# Patient Record
Sex: Female | Born: 1950 | Race: White | Hispanic: No | Smoking: Former smoker
Health system: Southern US, Community
[De-identification: ages and names within clinical notes are randomized; demographics above are authoritative.]

## PROBLEM LIST (undated history)

## (undated) DIAGNOSIS — D649 Anemia, unspecified: Secondary | ICD-10-CM

## (undated) DIAGNOSIS — R51 Headache: Secondary | ICD-10-CM

## (undated) DIAGNOSIS — E039 Hypothyroidism, unspecified: Secondary | ICD-10-CM

## (undated) DIAGNOSIS — K5909 Other constipation: Secondary | ICD-10-CM

## (undated) DIAGNOSIS — G8929 Other chronic pain: Secondary | ICD-10-CM

## (undated) DIAGNOSIS — E119 Type 2 diabetes mellitus without complications: Secondary | ICD-10-CM

## (undated) DIAGNOSIS — M1990 Unspecified osteoarthritis, unspecified site: Secondary | ICD-10-CM

## (undated) DIAGNOSIS — E1142 Type 2 diabetes mellitus with diabetic polyneuropathy: Secondary | ICD-10-CM

## (undated) DIAGNOSIS — I10 Essential (primary) hypertension: Secondary | ICD-10-CM

## (undated) DIAGNOSIS — E7800 Pure hypercholesterolemia, unspecified: Secondary | ICD-10-CM

## (undated) DIAGNOSIS — N189 Chronic kidney disease, unspecified: Secondary | ICD-10-CM

## (undated) DIAGNOSIS — K2990 Gastroduodenitis, unspecified, without bleeding: Secondary | ICD-10-CM

## (undated) DIAGNOSIS — F319 Bipolar disorder, unspecified: Secondary | ICD-10-CM

## (undated) DIAGNOSIS — R4189 Other symptoms and signs involving cognitive functions and awareness: Secondary | ICD-10-CM

## (undated) DIAGNOSIS — F410 Panic disorder [episodic paroxysmal anxiety] without agoraphobia: Secondary | ICD-10-CM

## (undated) DIAGNOSIS — R519 Headache, unspecified: Secondary | ICD-10-CM

## (undated) DIAGNOSIS — R0602 Shortness of breath: Secondary | ICD-10-CM

## (undated) DIAGNOSIS — F22 Delusional disorders: Secondary | ICD-10-CM

## (undated) DIAGNOSIS — N3281 Overactive bladder: Secondary | ICD-10-CM

## (undated) DIAGNOSIS — F29 Unspecified psychosis not due to a substance or known physiological condition: Secondary | ICD-10-CM

## (undated) DIAGNOSIS — K219 Gastro-esophageal reflux disease without esophagitis: Secondary | ICD-10-CM

## (undated) DIAGNOSIS — F419 Anxiety disorder, unspecified: Secondary | ICD-10-CM

## (undated) HISTORY — PX: EYE SURGERY: SHX253

## (undated) HISTORY — PX: TUBAL LIGATION: SHX77

---

## 2003-08-12 ENCOUNTER — Emergency Department (HOSPITAL_COMMUNITY): Admission: EM | Admit: 2003-08-12 | Discharge: 2003-08-12 | Payer: Self-pay | Admitting: *Deleted

## 2004-08-02 ENCOUNTER — Emergency Department (HOSPITAL_COMMUNITY): Admission: EM | Admit: 2004-08-02 | Discharge: 2004-08-02 | Payer: Self-pay | Admitting: Emergency Medicine

## 2007-10-14 ENCOUNTER — Other Ambulatory Visit: Admission: RE | Admit: 2007-10-14 | Discharge: 2007-10-14 | Payer: Self-pay | Admitting: Obstetrics and Gynecology

## 2009-10-04 ENCOUNTER — Ambulatory Visit (HOSPITAL_COMMUNITY): Admission: RE | Admit: 2009-10-04 | Discharge: 2009-10-04 | Payer: Self-pay | Admitting: Ophthalmology

## 2010-07-08 LAB — BASIC METABOLIC PANEL
Calcium: 9.2 mg/dL (ref 8.4–10.5)
Creatinine, Ser: 0.81 mg/dL (ref 0.4–1.2)
GFR calc Af Amer: >60 mL/min (ref >60)
Potassium: 4.4 mEq/L (ref 3.5–5.1)
Sodium: 132 mEq/L — ABNORMAL LOW (ref 135–145)

## 2010-07-08 LAB — HEMOGLOBIN AND HEMATOCRIT, BLOOD: HCT: 38.4 % (ref 36.0–46.0)

## 2013-05-10 ENCOUNTER — Other Ambulatory Visit (INDEPENDENT_AMBULATORY_CARE_PROVIDER_SITE_OTHER): Payer: Self-pay | Admitting: *Deleted

## 2013-05-10 ENCOUNTER — Telehealth (INDEPENDENT_AMBULATORY_CARE_PROVIDER_SITE_OTHER): Payer: Self-pay | Admitting: *Deleted

## 2013-05-10 ENCOUNTER — Encounter (INDEPENDENT_AMBULATORY_CARE_PROVIDER_SITE_OTHER): Payer: Self-pay | Admitting: *Deleted

## 2013-05-10 DIAGNOSIS — Z800 Family history of malignant neoplasm of digestive organs: Secondary | ICD-10-CM

## 2013-05-10 DIAGNOSIS — Z1211 Encounter for screening for malignant neoplasm of colon: Secondary | ICD-10-CM

## 2013-05-10 DIAGNOSIS — D509 Iron deficiency anemia, unspecified: Secondary | ICD-10-CM

## 2013-05-10 MED ORDER — PEG-KCL-NACL-NASULF-NA ASC-C 100 G PO SOLR: 1.0000 | Freq: Once | ORAL | Status: DC

## 2013-05-10 NOTE — Telephone Encounter (Signed)
Patient needs movi prep 

## 2013-05-10 NOTE — Telephone Encounter (Signed)
agree

## 2013-05-10 NOTE — Telephone Encounter (Signed)
  Procedure: tcs/egd  Reason/Indication:  Iron def anemia, fam hx colon ca  Has patient had this procedure before?  Yes for EGD, no for TCS  If so, when, by whom and where?    Is there a family history of colon cancer?  Yes, mother  Who?  What age when diagnosed?    Is patient diabetic?   yes      Does patient have prosthetic heart valve?  no  Do you have a pacemaker?  no  Has patient ever had endocarditis? no  Has patient had joint replacement within last 12 months?  no  Does patient tend to be constipated or take laxatives? no  Is patient on Coumadin, Plavix and/or Aspirin? yes  Medications: asa 81 mg daily, Lisinopril 5 mg daily, oxybutynin 5 mg daily, pravastatin 40 mg daily, metformin 500 mg 2 tab bid (am & pm), lorazepam 0.5 mg daily prn, venlafaxine 75 mg & 37.5 mg daily, Nexium 40 mg daily, ferrous sulfate 150 mg daily, levemir insulin 15 units in am & pm, vit c 1000 mg daily, vit d 3 2000 mg daily, vit b12 1000 mg daily  Allergies: nkda  Medication Adjustment: asa 2 days, iron 10 days, hold evening dose of levemir on 05/19/13 and don't take morning of  Procedure date & time: 05/20/13 at 730

## 2013-05-12 ENCOUNTER — Encounter (HOSPITAL_COMMUNITY): Payer: Self-pay | Admitting: Pharmacy Technician

## 2013-05-16 ENCOUNTER — Emergency Department (HOSPITAL_COMMUNITY)
Admission: EM | Admit: 2013-05-16 | Discharge: 2013-05-16 | Disposition: A | Discharge status: Home / Self Care | Payer: Medicare Other | Attending: Emergency Medicine | Admitting: Emergency Medicine

## 2013-05-16 ENCOUNTER — Emergency Department (HOSPITAL_COMMUNITY): Payer: Medicare Other

## 2013-05-16 ENCOUNTER — Encounter (HOSPITAL_COMMUNITY): Payer: Self-pay | Admitting: Emergency Medicine

## 2013-05-16 DIAGNOSIS — R519 Headache, unspecified: Secondary | ICD-10-CM

## 2013-05-16 DIAGNOSIS — Z792 Long term (current) use of antibiotics: Secondary | ICD-10-CM | POA: Insufficient documentation

## 2013-05-16 DIAGNOSIS — I10 Essential (primary) hypertension: Secondary | ICD-10-CM | POA: Insufficient documentation

## 2013-05-16 DIAGNOSIS — Z79899 Other long term (current) drug therapy: Secondary | ICD-10-CM | POA: Insufficient documentation

## 2013-05-16 DIAGNOSIS — N318 Other neuromuscular dysfunction of bladder: Secondary | ICD-10-CM | POA: Insufficient documentation

## 2013-05-16 DIAGNOSIS — R51 Headache: Secondary | ICD-10-CM | POA: Insufficient documentation

## 2013-05-16 DIAGNOSIS — E7800 Pure hypercholesterolemia, unspecified: Secondary | ICD-10-CM | POA: Insufficient documentation

## 2013-05-16 DIAGNOSIS — E119 Type 2 diabetes mellitus without complications: Secondary | ICD-10-CM | POA: Insufficient documentation

## 2013-05-16 DIAGNOSIS — Z87891 Personal history of nicotine dependence: Secondary | ICD-10-CM | POA: Insufficient documentation

## 2013-05-16 DIAGNOSIS — Z7982 Long term (current) use of aspirin: Secondary | ICD-10-CM | POA: Insufficient documentation

## 2013-05-16 DIAGNOSIS — Z794 Long term (current) use of insulin: Secondary | ICD-10-CM | POA: Insufficient documentation

## 2013-05-16 DIAGNOSIS — Z8719 Personal history of other diseases of the digestive system: Secondary | ICD-10-CM | POA: Insufficient documentation

## 2013-05-16 DIAGNOSIS — F410 Panic disorder [episodic paroxysmal anxiety] without agoraphobia: Secondary | ICD-10-CM | POA: Insufficient documentation

## 2013-05-16 DIAGNOSIS — L089 Local infection of the skin and subcutaneous tissue, unspecified: Secondary | ICD-10-CM

## 2013-05-16 HISTORY — DX: Hypothyroidism, unspecified: E03.9

## 2013-05-16 HISTORY — DX: Overactive bladder: N32.81

## 2013-05-16 HISTORY — DX: Gastro-esophageal reflux disease without esophagitis: K21.9

## 2013-05-16 HISTORY — DX: Panic disorder (episodic paroxysmal anxiety): F41.0

## 2013-05-16 HISTORY — DX: Pure hypercholesterolemia, unspecified: E78.00

## 2013-05-16 HISTORY — DX: Type 2 diabetes mellitus without complications: E11.9

## 2013-05-16 HISTORY — DX: Essential (primary) hypertension: I10

## 2013-05-16 LAB — GLUCOSE, CAPILLARY: Glucose-Capillary: 84 mg/dL (ref 70–99)

## 2013-05-16 MED ORDER — PROCHLORPERAZINE EDISYLATE 5 MG/ML IJ SOLN
10.0000 mg | Freq: Once | INTRAMUSCULAR | Status: AC
  Administered 2013-05-16: 10 mg via INTRAMUSCULAR
  Filled 2013-05-16: qty 2

## 2013-05-16 MED ORDER — DIPHENHYDRAMINE HCL 25 MG PO CAPS: 25.0000 mg | ORAL_CAPSULE | Freq: Once | ORAL | Status: DC

## 2013-05-16 MED ORDER — KETOROLAC TROMETHAMINE 30 MG/ML IJ SOLN
30.0000 mg | Freq: Once | INTRAMUSCULAR | Status: AC
  Administered 2013-05-16: 30 mg via INTRAMUSCULAR
  Filled 2013-05-16: qty 1

## 2013-05-16 MED ORDER — CEPHALEXIN 500 MG PO CAPS: 500.0000 mg | ORAL_CAPSULE | Freq: Four times a day (QID) | ORAL | Status: DC

## 2013-05-16 NOTE — ED Notes (Signed)
Family at bedside. Patient states that EDP told her that he would have someone to check a CBG.

## 2013-05-16 NOTE — ED Notes (Addendum)
C/o headache that started yesterday. Pt reports pain increased today with pressure. C/o nausea with pain.

## 2013-05-16 NOTE — Discharge Instructions (Signed)

## 2013-05-19 NOTE — ED Provider Notes (Signed)
CSN: [id number]     Arrival date & time 05/16/13  1655 History   First MD Initiated Contact with Patient 05/16/13 1936     Chief Complaint  Patient presents with  . Headache  . Dizziness   (Consider location/radiation/quality/duration/timing/severity/associated sxs/prior Treatment) HPI  [age]-year-old female with headache. Gradual onset yesterday. The pain is diffuse describes as a pressure. Constant. No appreciable exacerbating relieving factors. Mild nausea, no vomiting. No fevers or chills. She denies any trauma. No acute visual changes. No numbness, tingling or loss of strength. No blood thinners aside from ASA. Has not tried taking anything for her symptoms. Also complaining of some mild pain in her left middle toe. Ounces early after trimming her nails. The pain at rest she has pain with ambulating and palpation. No fevers or chills. No swelling. No drainage.  Past Medical History  Diagnosis Date  . Diabetes mellitus without complication   . Panic attack   . Hypertension   . Hypercholesteremia   . GERD (gastroesophageal reflux disease)   . Hypothyroid   . Overactive bladder    Past Surgical History  Procedure Laterality Date  . Eye surgery    . Tubal ligation     History reviewed. No pertinent family history. History  Substance Use Topics  . Smoking status: Former Research scientist (life sciences)  . Smokeless tobacco: Not on file  . Alcohol Use: No   OB History   Grav Para Term Preterm Abortions TAB SAB Ect Mult Living                 Review of Systems  All systems reviewed and negative, other than as noted in HPI.   Allergies  Review of patient's allergies indicates no known allergies.  Home Medications   Current Outpatient Rx  Name  Route  Sig  Dispense  Refill  . Ascorbic Acid (VITAMIN C) 1000 MG tablet   Oral   Take 1,000 mg by mouth daily.         Marland Kitchen aspirin EC 81 MG tablet   Oral   Take 81 mg by mouth daily.         . Cholecalciferol (VITAMIN D) 2000 UNITS CAPS    Oral   Take 1 capsule by mouth daily.         . insulin detemir (LEVEMIR) 100 UNIT/ML injection   Subcutaneous   Inject 15 Units into the skin 2 (two) times daily.         . iron polysaccharides (NIFEREX) 150 MG capsule   Oral   Take 150 mg by mouth daily.         Marland Kitchen lisinopril (PRINIVIL,ZESTRIL) 5 MG tablet   Oral   Take 5 mg by mouth daily.         Marland Kitchen LORazepam (ATIVAN) 0.5 MG tablet   Oral   Take 0.5 mg by mouth daily as needed for anxiety.         . metFORMIN (GLUCOPHAGE) 500 MG tablet   Oral   Take 1,000 mg by mouth 2 (two) times daily with a meal.         . oxybutynin (DITROPAN) 5 MG tablet   Oral   Take 5 mg by mouth daily.         . pravastatin (PRAVACHOL) 40 MG tablet   Oral   Take 40 mg by mouth daily.         Marland Kitchen venlafaxine (EFFEXOR) 37.5 MG tablet   Oral   Take 37.5 mg by  mouth daily.         Marland Kitchen venlafaxine (EFFEXOR) 75 MG tablet   Oral   Take 75 mg by mouth daily.         . vitamin B-12 (CYANOCOBALAMIN) 1000 MCG tablet   Oral   Take 1,000 mcg by mouth daily.         . cephALEXin (KEFLEX) 500 MG capsule   Oral   Take 1 capsule (500 mg total) by mouth 4 (four) times daily.   30 capsule   0   . peg 3350 powder (MOVIPREP) 100 G SOLR   Oral   Take 1 kit (200 g total) by mouth once.   1 kit   0    BP 147/69  Pulse 86  Temp(Src) 98.1 F (36.7 C) (Oral)  Resp 18  Ht 5' 1" (1.549 m)  Wt 190 lb (86.183 kg)  BMI 35.92 kg/m2  SpO2 100% Physical Exam  Nursing note and vitals reviewed. Constitutional: She is oriented to person, place, and time. She appears well-developed and well-nourished. No distress.  HENT:  Head: Normocephalic and atraumatic.  Eyes: Conjunctivae and EOM are normal. Pupils are equal, round, and reactive to light. Right eye exhibits no discharge. Left eye exhibits no discharge.  Neck: Neck supple.  No nuchal rigidity  Cardiovascular: Normal rate, regular rhythm and normal heart sounds.  Exam reveals no gallop  and no friction rub.   No murmur heard. Pulmonary/Chest: Effort normal and breath sounds normal. No respiratory distress.  Abdominal: Soft. She exhibits no distension. There is no tenderness.  Musculoskeletal: She exhibits no edema and no tenderness.  Distal aspect of the left middle toe with mild erythema and tenderness to palpation. No fluctuance. No drainage.  Neurological: She is alert and oriented to person, place, and time. No cranial nerve deficit. She exhibits normal muscle tone. Coordination normal.  Gait steady  Skin: Skin is warm and dry.  Psychiatric: She has a normal mood and affect. Her behavior is normal. Thought content normal.    ED Course  Procedures (including critical care time) Labs Review Labs Reviewed  GLUCOSE, CAPILLARY   Imaging Review No results found.  EKG Interpretation   None       MDM   1. Headache   2. Toe infection    62yF with HA. Suspect primary HA. Consider emergent secondary causes such as bleed, infectious or mass but doubt. There is no history of trauma. Pt has a nonfocal neurological exam. Afebrile and neck supple. No use of blood thinning medication. Consider ocular etiology such as acute angle closure glaucoma but doubt. Pt denies acute change in visual acuity and eye exam unremarkable. Doubt temporal arteritis. No temporal tenderness and temporal artery pulsations palpable. Doubt CO poisoning. No contacts with similar symptoms. Doubt venous thrombosis. Doubt carotid or vertebral arteries dissection. Symptoms improved with meds. L middle toe with findings concerning for possible early cellulitis. Given her history of diabetes particular will treat.    Virgel Manifold, MD 05/19/13 [phone]

## 2013-05-20 ENCOUNTER — Encounter (HOSPITAL_COMMUNITY): Payer: Self-pay

## 2013-05-20 ENCOUNTER — Encounter (HOSPITAL_COMMUNITY)
Admission: RE | Disposition: A | Discharge status: Home / Self Care | Payer: Self-pay | Source: Ambulatory Visit | Attending: Internal Medicine

## 2013-05-20 ENCOUNTER — Ambulatory Visit (HOSPITAL_COMMUNITY)
Admission: RE | Admit: 2013-05-20 | Discharge: 2013-05-20 | Disposition: A | Discharge status: Home / Self Care | Payer: Medicare Other | Source: Ambulatory Visit | Attending: Internal Medicine | Admitting: Internal Medicine

## 2013-05-20 DIAGNOSIS — K644 Residual hemorrhoidal skin tags: Secondary | ICD-10-CM

## 2013-05-20 DIAGNOSIS — K5730 Diverticulosis of large intestine without perforation or abscess without bleeding: Secondary | ICD-10-CM

## 2013-05-20 DIAGNOSIS — I10 Essential (primary) hypertension: Secondary | ICD-10-CM | POA: Insufficient documentation

## 2013-05-20 DIAGNOSIS — Z8371 Family history of colonic polyps: Secondary | ICD-10-CM | POA: Insufficient documentation

## 2013-05-20 DIAGNOSIS — Z794 Long term (current) use of insulin: Secondary | ICD-10-CM | POA: Insufficient documentation

## 2013-05-20 DIAGNOSIS — Z7982 Long term (current) use of aspirin: Secondary | ICD-10-CM | POA: Insufficient documentation

## 2013-05-20 DIAGNOSIS — Z83719 Family history of colon polyps, unspecified: Secondary | ICD-10-CM | POA: Insufficient documentation

## 2013-05-20 DIAGNOSIS — K2990 Gastroduodenitis, unspecified, without bleeding: Secondary | ICD-10-CM

## 2013-05-20 DIAGNOSIS — K208 Other esophagitis without bleeding: Secondary | ICD-10-CM

## 2013-05-20 DIAGNOSIS — E119 Type 2 diabetes mellitus without complications: Secondary | ICD-10-CM | POA: Insufficient documentation

## 2013-05-20 DIAGNOSIS — D509 Iron deficiency anemia, unspecified: Secondary | ICD-10-CM | POA: Insufficient documentation

## 2013-05-20 DIAGNOSIS — Z800 Family history of malignant neoplasm of digestive organs: Secondary | ICD-10-CM | POA: Insufficient documentation

## 2013-05-20 DIAGNOSIS — R1310 Dysphagia, unspecified: Secondary | ICD-10-CM | POA: Insufficient documentation

## 2013-05-20 DIAGNOSIS — Z01812 Encounter for preprocedural laboratory examination: Secondary | ICD-10-CM | POA: Insufficient documentation

## 2013-05-20 DIAGNOSIS — K2960 Other gastritis without bleeding: Secondary | ICD-10-CM

## 2013-05-20 HISTORY — DX: Anemia, unspecified: D64.9

## 2013-05-20 HISTORY — DX: Unspecified osteoarthritis, unspecified site: M19.90

## 2013-05-20 HISTORY — PX: COLONOSCOPY WITH ESOPHAGOGASTRODUODENOSCOPY (EGD): SHX5779

## 2013-05-20 LAB — GLUCOSE, CAPILLARY: Glucose-Capillary: 135 mg/dL — ABNORMAL HIGH (ref 70–99)

## 2013-05-20 SURGERY — COLONOSCOPY WITH ESOPHAGOGASTRODUODENOSCOPY (EGD)
Anesthesia: Moderate Sedation

## 2013-05-20 MED ORDER — MEPERIDINE HCL 50 MG/ML IJ SOLN
INTRAMUSCULAR | Status: AC
  Filled 2013-05-20: qty 1

## 2013-05-20 MED ORDER — PROMETHAZINE HCL 25 MG/ML IJ SOLN
INTRAMUSCULAR | Status: AC
  Filled 2013-05-20: qty 1

## 2013-05-20 MED ORDER — PROMETHAZINE HCL 25 MG/ML IJ SOLN
INTRAMUSCULAR | Status: DC | PRN
  Administered 2013-05-20: 12.5 mg via INTRAVENOUS

## 2013-05-20 MED ORDER — STERILE WATER FOR IRRIGATION IR SOLN
Status: DC | PRN
  Administered 2013-05-20: 08:00:00

## 2013-05-20 MED ORDER — BUTAMBEN-TETRACAINE-BENZOCAINE 2-2-14 % EX AERO
INHALATION_SPRAY | CUTANEOUS | Status: DC | PRN
  Administered 2013-05-20: 2 via TOPICAL

## 2013-05-20 MED ORDER — SODIUM CHLORIDE 0.9 % IV SOLN
INTRAVENOUS | Status: DC
  Administered 2013-05-20: 07:00:00 via INTRAVENOUS

## 2013-05-20 MED ORDER — SODIUM CHLORIDE 0.9 % IJ SOLN
INTRAMUSCULAR | Status: AC
  Filled 2013-05-20: qty 10

## 2013-05-20 MED ORDER — MEPERIDINE HCL 50 MG/ML IJ SOLN
INTRAMUSCULAR | Status: DC | PRN
  Administered 2013-05-20 (×2): 25 mg via INTRAVENOUS

## 2013-05-20 MED ORDER — MIDAZOLAM HCL 5 MG/5ML IJ SOLN
INTRAMUSCULAR | Status: AC
  Filled 2013-05-20: qty 10

## 2013-05-20 MED ORDER — MIDAZOLAM HCL 5 MG/5ML IJ SOLN
INTRAMUSCULAR | Status: DC | PRN
  Administered 2013-05-20 (×2): 2 mg via INTRAVENOUS
  Administered 2013-05-20: 3 mg via INTRAVENOUS
  Administered 2013-05-20 (×2): 2 mg via INTRAVENOUS

## 2013-05-20 MED ORDER — MIDAZOLAM HCL 5 MG/5ML IJ SOLN
INTRAMUSCULAR | Status: AC
  Filled 2013-05-20: qty 5

## 2013-05-20 NOTE — H&P (Signed)
Erika Jacobs is an 63 y.o. female.   Chief Complaint: Patient is here for EGD, ED and colonoscopy. HPI: Patient is 32-year-old Caucasian female who was found to have iron deficiency anemia by Dr. Karie Kirks 2 weeks ago. She says she's had anemia most of her life. She has chronic GERD. Heartburn is well controlled with therapy. Symptoms relapse within a dose or 2. She also complains of intermittent solid food dysphagia. Her esophagus was dilated about  5 years ago at [facility] in  [facility]. She denies melena or rectal bleeding. She complains of intermittent nausea without vomiting. She states she's been losing weight since her diet was changed to improve control of her diabetes. Family history significant for colonic polyps and 2 brothers one of who died in his 65s of MI. Mother had colon carcinoma in early 37s and lived to be 61.  Past Medical History  Diagnosis Date  . Diabetes mellitus without complication   . Panic attack   . Hypertension   . Hypercholesteremia   . GERD (gastroesophageal reflux disease)   . Hypothyroid   . Overactive bladder   . Arthritis   . Anemia     Past Surgical History  Procedure Laterality Date  . Tubal ligation    . Eye surgery Left     History reviewed. No pertinent family history. Social History:  reports that she quit smoking about 11 years ago. Her smoking use included Cigarettes. She has a 60 pack-year smoking history. She does not have any smokeless tobacco history on file. She reports that she does not drink alcohol or use illicit drugs.  Allergies: No Known Allergies  Medications Prior to Admission  Medication Sig Dispense Refill  . Ascorbic Acid (VITAMIN C) 1000 MG tablet Take 1,000 mg by mouth daily.      Marland Kitchen aspirin EC 81 MG tablet Take 81 mg by mouth daily.      . cephALEXin (KEFLEX) 500 MG capsule Take 1 capsule (500 mg total) by mouth 4 (four) times daily.  30 capsule  0  . Cholecalciferol (VITAMIN D) 2000 UNITS CAPS Take 1 capsule by  mouth daily.      . insulin detemir (LEVEMIR) 100 UNIT/ML injection Inject 15 Units into the skin 2 (two) times daily.      . iron polysaccharides (NIFEREX) 150 MG capsule Take 150 mg by mouth daily.      Marland Kitchen lisinopril (PRINIVIL,ZESTRIL) 5 MG tablet Take 5 mg by mouth daily.      Marland Kitchen LORazepam (ATIVAN) 0.5 MG tablet Take 0.5 mg by mouth daily as needed for anxiety.      . metFORMIN (GLUCOPHAGE) 500 MG tablet Take 1,000 mg by mouth 2 (two) times daily with a meal.      . oxybutynin (DITROPAN) 5 MG tablet Take 5 mg by mouth daily.      . peg 3350 powder (MOVIPREP) 100 G SOLR Take 1 kit (200 g total) by mouth once.  1 kit  0  . pravastatin (PRAVACHOL) 40 MG tablet Take 40 mg by mouth daily.      Marland Kitchen venlafaxine (EFFEXOR) 37.5 MG tablet Take 37.5 mg by mouth daily.      Marland Kitchen venlafaxine (EFFEXOR) 75 MG tablet Take 75 mg by mouth daily.      . vitamin B-12 (CYANOCOBALAMIN) 1000 MCG tablet Take 1,000 mcg by mouth daily.           Nexium 40 mg by mouth q. a.m.  Results for orders placed  during the hospital encounter of 05/20/13 (from the past 48 hour(s))  GLUCOSE, CAPILLARY     Status: Abnormal   Collection Time    05/20/13  7:08 AM      Result Value Range   Glucose-Capillary 135 (*) 70 - 99 mg/dL   No results found.  ROS  Blood pressure 154/71, pulse 103, temperature 97.8 F (36.6 C), temperature source Oral, resp. rate 18, height _0  (1.549 m), weight 190 lb (86.183 kg), SpO2 97.00%. Physical Exam  Constitutional: She appears well-developed and well-nourished.  HENT:  Mouth/Throat: Oropharynx is clear and moist.  Eyes: Conjunctivae are normal. No scleral icterus.  Neck: No thyromegaly present.  Cardiovascular: Normal rate, regular rhythm and normal heart sounds.   No murmur heard. Respiratory: Effort normal and breath sounds normal.  GI: Soft. She exhibits no distension (mild midepigastric tenderness) and no mass.  Musculoskeletal: She exhibits no edema.  Lymphadenopathy:    She has no  cervical adenopathy.  Neurological: She is alert.  Skin: Skin is warm and dry.     Assessment/Plan Solid food dysphagia in a patient with chronic GERD. Iron deficiency anemia. History of colonic carcinoma and colonic polyps. EGD, ED and colonoscopy.  Erika,NAJEEB Jacobs 05/20/2013, 7:36 AM

## 2013-05-20 NOTE — Op Note (Signed)
EGD PROCEDURE REPORT  PATIENT:  Erika Jacobs  MR#:  [id number] Birthdate:  [birth year], 63 y.o., female Endoscopist:  Dr. Rogene Houston, MD Referred By:  Dr. Estill Bamberg. Karie Kirks, MD Procedure Date: 05/20/2013  Procedure:   EGD, ED & Colonoscopy  Indications:  Patient is 48-year-old Caucasian female who presents with intermittent solid food dysphagia she also has iron deficiency anemia. She has chronic GERD and heartburn is well controlled with PPI. She was recently found to have iron deficiency anemia by Dr. Karie Kirks. She denies melena or rectal bleeding. Family history significant for colonic polyps and 2 brothers(one diseased) colon carcinoma in mother in her early 25s.            Informed Consent:  The risks, benefits, alternatives & imponderables which include, but are not limited to, bleeding, infection, perforation, drug reaction and potential missed lesion have been reviewed.  The potential for biopsy, lesion removal, esophageal dilation, etc. have also been discussed.  Questions have been answered.  All parties agreeable.  Please see history & physical in medical record for more information.  Medications:  Demerol 50 mg IV Versed 11 mg IV Promethazine 12.5 mg IV and diluted form. Cetacaine spray topically for oropharyngeal anesthesia  EGD  Description of procedure:  The endoscope was introduced through the mouth and advanced to the second portion of the duodenum without difficulty or limitations. The mucosal surfaces were surveyed very carefully during advancement of the scope and upon withdrawal.  Findings:  Esophagus:  Mucosa of the esophagus was normal. Focal edema and erythema noted at GE junction without ring or stricture formation. GEJ:  36 cm Stomach:  Stomach was empty and distended very well with insufflation. Folds in the proximal stomach were normal. Mucosa at gastric body was normal. Two prepyloric erosions noted. Angularis fundus and cardia were unremarkable.  Pyloric channel was patent. Duodenum:  Patchy bulbar erythema and edema noted. Post bulbar mucosa was carefully examined with attention to rely and no abnormality noted.  Therapeutic/Diagnostic Maneuvers Performed:   Esophagus dilated by passing 33 French Maloney dilator for insertion. Esophageal mucosa was reexamined post dilation and no mucosal disruption noted.  COLONOSCOPY Description of procedure:  After a digital rectal exam was performed, that colonoscope was advanced from the anus through the rectum and colon to the area of the cecum, ileocecal valve and appendiceal orifice. The cecum was deeply intubated. These structures were well-seen and photographed for the record. From the level of the cecum and ileocecal valve, the scope was slowly and cautiously withdrawn. The mucosal surfaces were carefully surveyed utilizing scope tip to flexion to facilitate fold flattening as needed. The scope was pulled down into the rectum where a thorough exam including retroflexion was performed.  Findings:   Prep excellent. Scattered diverticula noted at sigmoid colon. Normal rectal mucosa. Small hemorrhoids below the dentate line.   Therapeutic/Diagnostic Maneuvers Performed:  None  Complications:  None  Cecal Withdrawal Time:  9 minutes  Impression:  EGD findings; Mild changes of reflux esophagitis limited to GE junction without ring or stricture formation. Erosive antral gastritis and bulbar duodenitis.  Colonoscopy findings; Examination performed to cecum. Moderate sigmoid colon diverticulosis. Small external hemorrhoids.  Please note endoscopic pictures are in EGD folder.  Recommendations:  Patient to resume iron preparation as before. H. pylori serology. Consider next colonoscopy in 5 years.  Zylie Mumaw U  05/20/2013 8:27 AM  CC: Dr. Robert Bellow, MD & Dr. Rayne Du ref. provider found

## 2013-05-20 NOTE — Progress Notes (Signed)
Awake. Diet coke given to drink per pt request. Swallowing without difficulty. Tolerated well. H-pylori drawn and sent to lab for results.

## 2013-05-20 NOTE — Discharge Instructions (Signed)
Resume usual medications. High fiber diet. No driving for 24 hours. Physician will contact you with results of blood work High-Fiber Diet Fiber is found in fruits, vegetables, and grains. A high-fiber diet encourages the addition of more whole grains, legumes, fruits, and vegetables in your diet. The recommended amount of fiber for adult males is 38 g per day. For adult females, it is 25 g per day. Pregnant and lactating women should get 28 g of fiber per day. If you have a digestive or bowel problem, ask your caregiver for advice before adding high-fiber foods to your diet. Eat a variety of high-fiber foods instead of only a select few type of foods.  PURPOSE  To increase stool bulk.  To make bowel movements more regular to prevent constipation.  To lower cholesterol.  To prevent overeating. WHEN IS THIS DIET USED?  It may be used if you have constipation and hemorrhoids.  It may be used if you have uncomplicated diverticulosis (intestine condition) and irritable bowel syndrome.  It may be used if you need help with weight management.  It may be used if you want to add it to your diet as a protective measure against atherosclerosis, diabetes, and cancer. SOURCES OF FIBER  Whole-grain breads and cereals.  Fruits, such as apples, oranges, bananas, berries, prunes, and pears.  Vegetables, such as green peas, carrots, sweet potatoes, beets, broccoli, cabbage, spinach, and artichokes.  Legumes, such split peas, soy, lentils.  Almonds. FIBER CONTENT IN FOODS Starches and Grains / Dietary Fiber (g)  Cheerios, 1 cup / 3 g  Corn Flakes cereal, 1 cup / 0.7 g  Rice crispy treat cereal, 1 cup / 0.3 g  Instant oatmeal (cooked),  cup / 2 g  Frosted wheat cereal, 1 cup / 5.1 g  Brown, long-grain rice (cooked), 1 cup / 3.5 g  White, long-grain rice (cooked), 1 cup / 0.6 g  Enriched macaroni (cooked), 1 cup / 2.5 g Legumes / Dietary Fiber (g)  Baked beans (canned, plain, or  vegetarian),  cup / 5.2 g  Kidney beans (canned),  cup / 6.8 g  Pinto beans (cooked),  cup / 5.5 g Breads and Crackers / Dietary Fiber (g)  Plain or honey graham crackers, 2 squares / 0.7 g  Saltine crackers, 3 squares / 0.3 g  Plain, salted pretzels, 10 pieces / 1.8 g  Whole-wheat bread, 1 slice / 1.9 g  White bread, 1 slice / 0.7 g  Raisin bread, 1 slice / 1.2 g  Plain bagel, 3 oz / 2 g  Flour tortilla, 1 oz / 0.9 g  Corn tortilla, 1 small / 1.5 g  Hamburger or hotdog bun, 1 small / 0.9 g Fruits / Dietary Fiber (g)  Apple with skin, 1 medium / 4.4 g  Sweetened applesauce,  cup / 1.5 g  Banana,  medium / 1.5 g  Grapes, 10 grapes / 0.4 g  Orange, 1 small / 2.3 g  Raisin, 1.5 oz / 1.6 g  Melon, 1 cup / 1.4 g Vegetables / Dietary Fiber (g)  Green beans (canned),  cup / 1.3 g  Carrots (cooked),  cup / 2.3 g  Broccoli (cooked),  cup / 2.8 g  Peas (cooked),  cup / 4.4 g  Mashed potatoes,  cup / 1.6 g  Lettuce, 1 cup / 0.5 g  Corn (canned),  cup / 1.6 g  Tomato,  cup / 1.1 g Document Released: 04/07/2005 Document Revised: 10/07/2011 Document Reviewed: 07/10/2011 Exit[employer] Patient  Information 2014 [place], [place]. Colonoscopy, Care After Refer to this sheet in the next few weeks. These instructions provide you with information on caring for yourself after your procedure. Your health care provider may also give you more specific instructions. Your treatment has been planned according to current medical practices, but problems sometimes occur. Call your health care provider if you have any problems or questions after your procedure. WHAT TO EXPECT AFTER THE PROCEDURE  After your procedure, it is typical to have the following:  A small amount of blood in your stool.  Moderate amounts of gas and mild abdominal cramping or bloating. HOME CARE INSTRUCTIONS  Do not drive, operate machinery, or sign important documents for 24 hours.  You may shower  and resume your regular physical activities, but move at a slower pace for the first 24 hours.  Take frequent rest periods for the first 24 hours.  Walk around or put a warm pack on your abdomen to help reduce abdominal cramping and bloating.  Drink enough fluids to keep your urine clear or pale yellow.  You may resume your normal diet as instructed by your health care provider. Avoid heavy or fried foods that are hard to digest.  Avoid drinking alcohol for 24 hours or as instructed by your health care provider.  Only take over-the-counter or prescription medicines as directed by your health care provider.  If a tissue sample (biopsy) was taken during your procedure:  Do not take aspirin or blood thinners for 7 days, or as instructed by your health care provider.  Do not drink alcohol for 7 days, or as instructed by your health care provider.  Eat soft foods for the first 24 hours. SEEK MEDICAL CARE IF: You have persistent spotting of blood in your stool 2 3 days after the procedure. SEEK IMMEDIATE MEDICAL CARE IF:  You have more than a small spotting of blood in your stool.  You pass large blood clots in your stool.  Your abdomen is swollen (distended).  You have nausea or vomiting.  You have a fever.  You have increasing abdominal pain that is not relieved with medicine. Document Released: 11/20/2003 Document Revised: 01/26/2013 Document Reviewed: 12/13/2012 [facility] Patient Information 2014 [place].

## 2013-05-21 ENCOUNTER — Emergency Department (HOSPITAL_COMMUNITY): Payer: Medicare Other

## 2013-05-21 ENCOUNTER — Encounter (HOSPITAL_COMMUNITY): Payer: Self-pay | Admitting: Emergency Medicine

## 2013-05-21 ENCOUNTER — Emergency Department (HOSPITAL_COMMUNITY)
Admission: EM | Admit: 2013-05-21 | Discharge: 2013-05-21 | Disposition: A | Discharge status: Home / Self Care | Payer: Medicare Other | Attending: Emergency Medicine | Admitting: Emergency Medicine

## 2013-05-21 DIAGNOSIS — Z792 Long term (current) use of antibiotics: Secondary | ICD-10-CM | POA: Insufficient documentation

## 2013-05-21 DIAGNOSIS — M549 Dorsalgia, unspecified: Secondary | ICD-10-CM | POA: Insufficient documentation

## 2013-05-21 DIAGNOSIS — R110 Nausea: Secondary | ICD-10-CM | POA: Insufficient documentation

## 2013-05-21 DIAGNOSIS — Z862 Personal history of diseases of the blood and blood-forming organs and certain disorders involving the immune mechanism: Secondary | ICD-10-CM | POA: Insufficient documentation

## 2013-05-21 DIAGNOSIS — Z87448 Personal history of other diseases of urinary system: Secondary | ICD-10-CM | POA: Insufficient documentation

## 2013-05-21 DIAGNOSIS — Z79899 Other long term (current) drug therapy: Secondary | ICD-10-CM | POA: Insufficient documentation

## 2013-05-21 DIAGNOSIS — R209 Unspecified disturbances of skin sensation: Secondary | ICD-10-CM | POA: Insufficient documentation

## 2013-05-21 DIAGNOSIS — E119 Type 2 diabetes mellitus without complications: Secondary | ICD-10-CM | POA: Insufficient documentation

## 2013-05-21 DIAGNOSIS — M129 Arthropathy, unspecified: Secondary | ICD-10-CM | POA: Insufficient documentation

## 2013-05-21 DIAGNOSIS — Z87891 Personal history of nicotine dependence: Secondary | ICD-10-CM | POA: Insufficient documentation

## 2013-05-21 DIAGNOSIS — F410 Panic disorder [episodic paroxysmal anxiety] without agoraphobia: Secondary | ICD-10-CM | POA: Insufficient documentation

## 2013-05-21 DIAGNOSIS — Z8719 Personal history of other diseases of the digestive system: Secondary | ICD-10-CM | POA: Insufficient documentation

## 2013-05-21 DIAGNOSIS — Z7982 Long term (current) use of aspirin: Secondary | ICD-10-CM | POA: Insufficient documentation

## 2013-05-21 DIAGNOSIS — E7800 Pure hypercholesterolemia, unspecified: Secondary | ICD-10-CM | POA: Insufficient documentation

## 2013-05-21 DIAGNOSIS — R200 Anesthesia of skin: Secondary | ICD-10-CM

## 2013-05-21 DIAGNOSIS — M25559 Pain in unspecified hip: Secondary | ICD-10-CM | POA: Insufficient documentation

## 2013-05-21 DIAGNOSIS — Z794 Long term (current) use of insulin: Secondary | ICD-10-CM | POA: Insufficient documentation

## 2013-05-21 DIAGNOSIS — I10 Essential (primary) hypertension: Secondary | ICD-10-CM | POA: Insufficient documentation

## 2013-05-21 LAB — COMPREHENSIVE METABOLIC PANEL
ALT: 54 U/L — AB (ref 0–35)
AST: 45 U/L — ABNORMAL HIGH (ref 0–37)
Albumin: 3.4 g/dL — ABNORMAL LOW (ref 3.5–5.2)
Alkaline Phosphatase: 58 U/L (ref 39–117)
BUN: 11 mg/dL (ref 6–23)
CALCIUM: 8.8 mg/dL (ref 8.4–10.5)
CHLORIDE: 103 meq/L (ref 96–112)
CO2: 24 meq/L (ref 19–32)
Creatinine, Ser: 0.78 mg/dL (ref 0.50–1.10)
GFR calc Af Amer: >90 mL/min (ref >90)
GFR, EST NON AFRICAN AMERICAN: 89 mL/min — AB (ref >90)
GLUCOSE: 120 mg/dL — AB (ref 70–99)
Potassium: 4.3 mEq/L (ref 3.7–5.3)
SODIUM: 138 meq/L (ref 137–147)
Total Bilirubin: 0.2 mg/dL — ABNORMAL LOW (ref 0.3–1.2)
Total Protein: 7.3 g/dL (ref 6.0–8.3)

## 2013-05-21 LAB — CBC WITH DIFFERENTIAL/PLATELET
Basophils Absolute: 0 10*3/uL (ref 0.0–0.1)
Basophils Relative: 0 % (ref 0–1)
EOS ABS: 0.1 10*3/uL (ref 0.0–0.7)
Eosinophils Relative: 2 % (ref 0–5)
HEMATOCRIT: 32.7 % — AB (ref 36.0–46.0)
HEMOGLOBIN: 9.8 g/dL — AB (ref 12.0–15.0)
Lymphocytes Relative: 42 % (ref 12–46)
Lymphs Abs: 1.7 10*3/uL (ref 0.7–4.0)
MCH: 21.8 pg — ABNORMAL LOW (ref 26.0–34.0)
MCHC: 30 g/dL (ref 30.0–36.0)
MCV: 72.8 fL — ABNORMAL LOW (ref 78.0–100.0)
Monocytes Absolute: 0.3 10*3/uL (ref 0.1–1.0)
Monocytes Relative: 8 % (ref 3–12)
NEUTROS ABS: 1.9 10*3/uL (ref 1.7–7.7)
NEUTROS PCT: 48 % (ref 43–77)
Platelets: 192 10*3/uL (ref 150–400)
RBC: 4.49 MIL/uL (ref 3.87–5.11)
RDW: 23.8 % — ABNORMAL HIGH (ref 11.5–15.5)
WBC: 4 10*3/uL (ref 4.0–10.5)

## 2013-05-21 LAB — URINALYSIS, ROUTINE W REFLEX MICROSCOPIC
BILIRUBIN URINE: NEGATIVE
GLUCOSE, UA: NEGATIVE mg/dL
HGB URINE DIPSTICK: NEGATIVE
Ketones, ur: NEGATIVE mg/dL
Leukocytes, UA: NEGATIVE
Nitrite: NEGATIVE
PROTEIN: NEGATIVE mg/dL
Specific Gravity, Urine: <1.005 — ABNORMAL LOW (ref 1.005–1.030)
UROBILINOGEN UA: 0.2 mg/dL (ref 0.0–1.0)
pH: 5.5 (ref 5.0–8.0)

## 2013-05-21 LAB — LACTIC ACID, PLASMA: Lactic Acid, Venous: 0.9 mmol/L (ref 0.5–2.2)

## 2013-05-21 MED ORDER — SODIUM CHLORIDE 0.9 % IV BOLUS (SEPSIS)
1000.0000 mL | Freq: Once | INTRAVENOUS | Status: AC
  Administered 2013-05-21: 1000 mL via INTRAVENOUS

## 2013-05-21 NOTE — ED Notes (Signed)
Pt c/o feeling like I am going to pass out, tingling to left hand that started this am around 9:00, lower back pain that radiates to right buttock area, pt states that the back pain feels the same as her back problems in the past, pt states that she had an episode similar to today's episode on 05/04/2013, was seen at [facility] er and admitted for evaluation, unsure of diagnosis,

## 2013-05-21 NOTE — Discharge Instructions (Signed)
As discussed, it is important that you follow up as soon as possible with your physician for continued management of your condition. ° °If you develop any new, or concerning changes in your condition, please return to the emergency department immediately. ° °

## 2013-05-21 NOTE — ED Provider Notes (Signed)
CSN: [id number]     Arrival date & time 05/21/13  1206 History  This chart was scribed for Carmin Muskrat, MD by Maree Erie, ED Scribe. The patient was seen in room [facility]. Patient's care was started at 12:41 PM.     Chief Complaint  Patient presents with  . Numbness    The history is provided by the patient. No language interpreter was used.    HPI Comments: AMEILIA HIRAKAWA is a 63 y.o. female who presents to the Emergency Department complaining of an episode of constant, improving numbness to her left leg and around mouth that began this morning a few hours ago. She reports constant nausea with the numbness. The is also complaining of right hip and back pain that she states is new today. She denies fever, vomiting or diarrhea. She states that she had a colonoscopy and endoscopy done yesterday that shestates was negative for polyps but the endoscopy showed an "infection in her throat." She states she is going to be scheduled to have a cholecystectomy. She reports a family history of polyps.  Past Medical History  Diagnosis Date  . Diabetes mellitus without complication   . Panic attack   . Hypertension   . Hypercholesteremia   . GERD (gastroesophageal reflux disease)   . Hypothyroid   . Overactive bladder   . Arthritis   . Anemia    Past Surgical History  Procedure Laterality Date  . Tubal ligation    . Eye surgery Left    No family history on file. History  Substance Use Topics  . Smoking status: Former Smoker -- 2.00 packs/day for 30 years    Types: Cigarettes    Quit date: 05/20/2002  . Smokeless tobacco: Not on file  . Alcohol Use: No   OB History   Grav Para Term Preterm Abortions TAB SAB Ect Mult Living                 Review of Systems  Constitutional:       Per HPI, otherwise negative  HENT:       Per HPI, otherwise negative  Respiratory:       Per HPI, otherwise negative  Cardiovascular:       Per HPI, otherwise negative  Gastrointestinal:  Negative for vomiting.  Endocrine:       Negative aside from HPI  Genitourinary:       Neg aside from HPI   Musculoskeletal:       Per HPI, otherwise negative  Skin: Negative.   Neurological: Negative for syncope.    Allergies  Review of patient's allergies indicates no known allergies.  Home Medications   Current Outpatient Rx  Name  Route  Sig  Dispense  Refill  . Ascorbic Acid (VITAMIN C) 1000 MG tablet   Oral   Take 1,000 mg by mouth daily.         Marland Kitchen aspirin EC 81 MG tablet   Oral   Take 81 mg by mouth daily.         . cephALEXin (KEFLEX) 500 MG capsule   Oral   Take 1 capsule (500 mg total) by mouth 4 (four) times daily.   30 capsule   0   . Cholecalciferol (VITAMIN D) 2000 UNITS CAPS   Oral   Take 1 capsule by mouth daily.         . insulin detemir (LEVEMIR) 100 UNIT/ML injection   Subcutaneous   Inject 15 Units into the skin  2 (two) times daily.         . iron polysaccharides (NIFEREX) 150 MG capsule   Oral   Take 150 mg by mouth daily.         Marland Kitchen lisinopril (PRINIVIL,ZESTRIL) 5 MG tablet   Oral   Take 5 mg by mouth daily.         Marland Kitchen LORazepam (ATIVAN) 0.5 MG tablet   Oral   Take 0.5 mg by mouth daily as needed for anxiety.         . metFORMIN (GLUCOPHAGE) 500 MG tablet   Oral   Take 1,000 mg by mouth 2 (two) times daily with a meal.         . oxybutynin (DITROPAN) 5 MG tablet   Oral   Take 5 mg by mouth daily.         . pravastatin (PRAVACHOL) 40 MG tablet   Oral   Take 40 mg by mouth daily.         Marland Kitchen venlafaxine (EFFEXOR) 37.5 MG tablet   Oral   Take 37.5 mg by mouth daily.         Marland Kitchen venlafaxine (EFFEXOR) 75 MG tablet   Oral   Take 75 mg by mouth daily.         . vitamin B-12 (CYANOCOBALAMIN) 1000 MCG tablet   Oral   Take 1,000 mcg by mouth daily.          There were no vitals taken for this visit. Physical Exam  Nursing note and vitals reviewed. Constitutional: She is oriented to person, place, and time.  She appears well-developed and well-nourished. No distress.  HENT:  Head: Normocephalic and atraumatic.  Eyes: Conjunctivae and EOM are normal.  Cardiovascular: Normal rate and regular rhythm.   Pulmonary/Chest: Effort normal and breath sounds normal. No stridor. No respiratory distress.  Abdominal: She exhibits no distension.  Musculoskeletal: She exhibits no edema.  Neurological: She is alert and oriented to person, place, and time. She has normal strength. No cranial nerve deficit or sensory deficit.  No facial asymmetry.   Skin: Skin is warm and dry.  Psychiatric: She has a normal mood and affect.    ED Course  Procedures (including critical care time)    COORDINATION OF CARE: 12:46 PM - Patient verbalizes understanding and agrees with treatment plan.    Labs Review Labs Reviewed  CBC WITH DIFFERENTIAL - Abnormal; Notable for the following:    Hemoglobin 9.8 (*)    HCT 32.7 (*)    MCV 72.8 (*)    MCH 21.8 (*)    RDW 23.8 (*)    All other components within normal limits  COMPREHENSIVE METABOLIC PANEL - Abnormal; Notable for the following:    Glucose, Bld 120 (*)    Albumin 3.4 (*)    AST 45 (*)    ALT 54 (*)    Total Bilirubin 0.2 (*)    GFR calc non Af Amer 89 (*)    All other components within normal limits  URINALYSIS, ROUTINE W REFLEX MICROSCOPIC - Abnormal; Notable for the following:    Specific Gravity, Urine <1.005 (*)    All other components within normal limits  LACTIC ACID, PLASMA   Imaging Review No results found.  EKG Interpretation   None       MDM     I personally performed the services described in this documentation, which was scribed in my presence. The recorded information has been reviewed and is accurate.  Patient presents one day after elective colonoscopy, endoscopy now with subjective neurologic complaints.  On exam she is awake, alert and hemodynamically stable, neurologically intact, with no evidence of systemic compromise.   With patient's reassuring physical exam, labs, she is appropriate for discharge with outpatient followup.  Carmin Muskrat, MD 05/21/13 [phone]

## 2013-05-23 ENCOUNTER — Encounter (HOSPITAL_COMMUNITY): Payer: Self-pay | Admitting: Internal Medicine

## 2013-05-23 ENCOUNTER — Telehealth (INDEPENDENT_AMBULATORY_CARE_PROVIDER_SITE_OTHER): Payer: Self-pay | Admitting: *Deleted

## 2013-05-23 LAB — H. PYLORI ANTIBODY, IGG: H Pylori IgG: 0.61 {ISR}

## 2013-05-23 NOTE — Telephone Encounter (Signed)
05/20/13 this is Dr.Rehman's recommendation from EGD/Colonoscopy: Impression:  EGD findings;  Mild changes of reflux esophagitis limited to GE junction without ring or stricture formation.  Erosive antral gastritis and bulbar duodenitis.  Colonoscopy findings;  Examination performed to cecum.  Moderate sigmoid colon diverticulosis.  Small external hemorrhoids.  Please note endoscopic pictures are in EGD folder.  Recommendations:  Patient to resume iron preparation as before.  H. pylori serology.  Consider next colonoscopy in 5 years.

## 2013-05-23 NOTE — Telephone Encounter (Signed)
Had a TCS on 05/19/13 and the lady that was with her spoke with Dr. Laural Golden. She didn't understand everything he said because she has a hearing impairment. Would like the results and any information about the procedure she may need to know.

## 2013-05-25 NOTE — Telephone Encounter (Signed)
Patient was called and her results was gone over with her. She was also given the result of her H- Pylori -Negative. Patient states that she went to the Ed last night at [facility]. Her B/P was high and she was told that she needed to get her Gall Bladder out ASAP. Dr. Karie Kirks told her that they had sent a second referral to [facility] on 05-24-13.

## 2013-05-26 NOTE — Consult Note (Signed)
NAME:  RODELLA, LOPER             ACCOUNT NO.:  [id number]  MEDICAL RECORD NO.:  [id number]  LOCATION:  AP[facility]05                         FACILITY:  [facility]  PHYSICIAN:  Felicie Morn, M.D. DATE OF BIRTH:  06/03/1950  DATE OF CONSULTATION:  05/25/2013 DATE OF DISCHARGE:  05/21/2013                                CONSULTATION   NOTE:  Surgery was asked to see this 63 year old obese white female with history of flank pain, seen in the emergency room and found on sonography to have gallstones.  In essence, she is fairly asymptomatic at present.  She has had no real nausea or vomiting.  However, she has had some vague right flank pain, and she was noted to have gallstones when worked up in the emergency room at [facility].  She was referred to me by Dr. Karie Kirks to see regarding her gallbladder disease.  PAST MEDICAL HISTORY:  Positive for having: 1. Type 2 diabetes. 2. Hypertension. 3. Anxiety attacks with occasional bouts of depression. 4. Reflux. 5. She has had a past history of Bell's palsy.  PAST SURGERIES:  A tubal ligation in 1978, and she had left cataract surgery.  ALLERGIES:  She has no known allergies.  MEDICATIONS:  See medication list.  SOCIAL HISTORY:  She is a nonsmoker and nondrinker.  PHYSICAL EXAMINATION:  VITAL SIGNS:  She is 5 feet 3/4 inches and weighs 195 pounds.  Her temperature is 97.7, her pulse is 88, respirations 12, blood pressure 156/70. HEENT:  Head is normocephalic.  Eyes, extraocular movements are intact. Pupils are round and reactive to light and accommodation.  The patient has had previous left cataract surgery, NECK:  I do not appreciate the presence of any bruits or thyromegaly or any adenopathy or jugular vein distention. CHEST:  Clear both anterior and posterior auscultation. HEART:  Regular rhythm.  Breasts and axillae are without masses. ABDOMEN:  Very globus.  She has no obvious hernias or visceromegaly. RECTAL:   Deferred. EXTREMITIES:  Without clubbing, varicosities, or cyanosis.  Pulses are full and symmetrical.  REVIEW OF SYSTEMS:  NEURO:  No history of any lateralizing neurological findings.  No migraines, no seizures.  He does have a history of anxiety and depression.  ENDOCRINE:  The patient is type 2 diabetic.  No history of thyroid disease or adrenal problems.  CARDIOPULMONARY:  The patient is hypertensive.  MUSCULOSKELETAL:  Obesity.  OB GYN:  She is a gravida 4, para 4, cesarean 0, abortus 0 female whose last menstrual period was in 2005.  Her last mammogram was 2010.  She stopped having her mammograms.  We have encouraged her to resume having yearly mammograms. She has had past history of tubal ligation.  She has as stated no carcinoma of the breast with any first line relatives.  GI:  No past history of hepatitis, constipation, diarrhea, bright red rectal bleeding, or melena or history of inflammatory bowel disease, or irritable bowel syndrome.  No history of unexplained weight loss.  The patient is obese, and she does suffer from GERD.  She underwent a colonoscopy in 2005 with no pathological findings as well as an upper GI endoscopy.  GU:  No history of frequency, dysuria, or  kidney stones.  Review of history and physical therefore Ms. Colletta is a 63 year old obese white female who has pretty much  asymptomatic gallstones, although she has some vague symptoms that suggest that she has gallbladder disease with her first episodes occurring now.  Because she is young and has these symptoms, I urged her to consider gallbladder surgery as this might be very well, would live long enough to have some problems with this and possibly presented in the emergent nature. However, the decision is hers.  She opted to have that surgery done.  We discussed complications not limited to but including bleeding, infection, damage to bile ducts, perforation of organs, transitory diarrhea, and the  possibility of open surgery might be required. Informed consent was obtained.  We will plan for surgery via the outpatient department.  We will review her liver function studies preoperatively, and I would like to thank Dr. Karie Kirks for his confidence in sending this patient my way.     Felicie Morn, M.D.     WB/MEDQ  D:  05/25/2013  T:  05/26/2013  Job:  [id number]  cc:   Estill Bamberg. Karie Kirks, M.D. [phone]  Hildred Laser, M.D. Fax: [phone]

## 2013-05-26 NOTE — Patient Instructions (Signed)
Erika Jacobs  05/26/2013   Your procedure is scheduled on:  06/02/2013  Report to [facility] at  82  AM.  Call this number if you have problems the morning of surgery: [phone]   Remember:   Do not eat food or drink liquids after midnight.   Take these medicines the morning of surgery with A SIP OF WATER: nexium, lisinopril, lorazepam, effexor   Do not wear jewelry, make-up or nail polish.  Do not wear lotions, powders, or perfumes.   Do not shave 48 hours prior to surgery. Men may shave face and neck.  Do not bring valuables to the hospital.  [facility] is not responsible for any belongings or valuables.               Contacts, dentures or bridgework may not be worn into surgery.  Leave suitcase in the car. After surgery it may be brought to your room.  For patients admitted to the hospital, discharge time is determined by your treatment team.               Patients discharged the day of surgery will not be allowed to drive home.  Name and phone number of your driver: family  Special Instructions: Shower using CHG 2 nights before surgery and the night before surgery.  If you shower the day of surgery use CHG.  Use special wash - you have one bottle of CHG for all showers.  You should use approximately 1/3 of the bottle for each shower.   Please read over the following fact sheets that you were given: Pain Booklet, Coughing and Deep Breathing, Surgical Site Infection Prevention, Anesthesia Post-op Instructions and Care and Recovery After Surgery Laparoscopic Cholecystectomy Laparoscopic cholecystectomy is surgery to remove the gallbladder. The gallbladder is located in the upper right part of the abdomen, behind the liver. It is a storage sac for bile produced in the liver. Bile aids in the digestion and absorption of fats. Cholecystectomy is often done for inflammation of the gallbladder (cholecystitis). This condition is usually caused by a buildup of gallstones  (cholelithiasis) in your gallbladder. Gallstones can block the flow of bile, resulting in inflammation and pain. In severe cases, emergency surgery may be required. When emergency surgery is not required, you will have time to prepare for the procedure. Laparoscopic surgery is an alternative to open surgery. Laparoscopic surgery has a shorter recovery time. Your common bile duct may also need to be examined during the procedure. If stones are found in the common bile duct, they may be removed. LET [facility] CARE PROVIDER KNOW ABOUT:  Any allergies you have.  All medicines you are taking, including vitamins, herbs, eye drops, creams, and over-the-counter medicines.  Previous problems you or members of your family have had with the use of anesthetics.  Any blood disorders you have.  Previous surgeries you have had.  Medical conditions you have. RISKS AND COMPLICATIONS Generally, this is a safe procedure. However, as with any procedure, complications can occur. Possible complications include:  Infection.  Damage to the common bile duct, nerves, arteries, veins, or other internal organs such as the stomach, liver, or intestines.  Bleeding.  A stone may remain in the common bile duct.  A bile leak from the cyst duct that is clipped when your gallbladder is removed.  The need to convert to open surgery, which requires a larger incision in the abdomen. This may be necessary if your surgeon  thinks it is not safe to continue with a laparoscopic procedure. BEFORE THE PROCEDURE  Ask your health care provider about changing or stopping any regular medicines. You will need to stop taking aspirin or blood thinners at least 5 days prior to surgery.  Do not eat or drink anything after midnight the night before surgery.  Let your health care provider know if you develop a cold or other infectious problem before surgery. PROCEDURE   You will be given medicine to make you sleep through the  procedure (general anesthetic). A breathing tube will be placed in your mouth.  When you are asleep, your surgeon will make several small cuts (incisions) in your abdomen.  A thin, lighted tube with a tiny camera on the end (laparoscope) is inserted through one of the small incisions. The camera on the laparoscope sends a picture to a TV screen in the operating room. This gives the surgeon a good view inside your abdomen.  A gas will be pumped into your abdomen. This expands your abdomen so that the surgeon has more room to perform the surgery.  Other tools needed for the procedure are inserted through the other incisions. The gallbladder is removed through one of the incisions.  After the removal of your gallbladder, the incisions will be closed with stitches, staples, or skin glue. AFTER THE PROCEDURE  You will be taken to a recovery area where your progress will be checked often.  You may be allowed to go home the same day if your pain is controlled and you can tolerate liquids. Document Released: 04/07/2005 Document Revised: 01/26/2013 Document Reviewed: 11/17/2012 [facility] Patient Information 2014 Erika Jacobs. PATIENT INSTRUCTIONS POST-ANESTHESIA  IMMEDIATELY FOLLOWING SURGERY:  Do not drive or operate machinery for the first twenty four hours after surgery.  Do not make any important decisions for twenty four hours after surgery or while taking narcotic pain medications or sedatives.  If you develop intractable nausea and vomiting or a severe headache please notify your doctor immediately.  FOLLOW-UP:  Please make an appointment with your surgeon as instructed. You do not need to follow up with anesthesia unless specifically instructed to do so.  WOUND CARE INSTRUCTIONS (if applicable):  Keep a dry clean dressing on the anesthesia/puncture wound site if there is drainage.  Once the wound has quit draining you may leave it open to air.  Generally you should leave the bandage intact  for twenty four hours unless there is drainage.  If the epidural site drains for more than 36-48 hours please call the anesthesia department.  QUESTIONS?:  Please feel free to call your physician or the hospital operator if you have any questions, and they will be happy to assist you.

## 2013-05-27 ENCOUNTER — Other Ambulatory Visit: Payer: Self-pay

## 2013-05-27 ENCOUNTER — Encounter (HOSPITAL_COMMUNITY)
Admission: RE | Admit: 2013-05-27 | Discharge: 2013-05-27 | Disposition: A | Discharge status: Home / Self Care | Payer: Medicare Other | Source: Ambulatory Visit | Attending: General Surgery | Admitting: General Surgery

## 2013-05-27 ENCOUNTER — Encounter (HOSPITAL_COMMUNITY): Payer: Self-pay | Admitting: Pharmacy Technician

## 2013-05-27 ENCOUNTER — Encounter (HOSPITAL_COMMUNITY): Payer: Self-pay

## 2013-05-27 DIAGNOSIS — Z01812 Encounter for preprocedural laboratory examination: Secondary | ICD-10-CM | POA: Insufficient documentation

## 2013-05-27 HISTORY — DX: Shortness of breath: R06.02

## 2013-05-27 LAB — CBC WITH DIFFERENTIAL/PLATELET
BASOS ABS: 0 10*3/uL (ref 0.0–0.1)
Basophils Relative: 0 % (ref 0–1)
EOS ABS: 0.1 10*3/uL (ref 0.0–0.7)
EOS PCT: 2 % (ref 0–5)
HEMATOCRIT: 33.9 % — AB (ref 36.0–46.0)
Hemoglobin: 10.2 g/dL — ABNORMAL LOW (ref 12.0–15.0)
LYMPHS PCT: 31 % (ref 12–46)
Lymphs Abs: 1.3 10*3/uL (ref 0.7–4.0)
MCH: 22.3 pg — AB (ref 26.0–34.0)
MCHC: 30.1 g/dL (ref 30.0–36.0)
MCV: 74 fL — AB (ref 78.0–100.0)
MONO ABS: 0.4 10*3/uL (ref 0.1–1.0)
Monocytes Relative: 9 % (ref 3–12)
Neutro Abs: 2.5 10*3/uL (ref 1.7–7.7)
Neutrophils Relative %: 58 % (ref 43–77)
PLATELETS: 188 10*3/uL (ref 150–400)
RBC: 4.58 MIL/uL (ref 3.87–5.11)
RDW: 24.6 % — ABNORMAL HIGH (ref 11.5–15.5)
WBC: 4.2 10*3/uL (ref 4.0–10.5)

## 2013-05-27 LAB — BASIC METABOLIC PANEL
BUN: 17 mg/dL (ref 6–23)
CALCIUM: 9.6 mg/dL (ref 8.4–10.5)
CO2: 25 meq/L (ref 19–32)
CREATININE: 0.89 mg/dL (ref 0.50–1.10)
Chloride: 101 mEq/L (ref 96–112)
GFR calc Af Amer: 80 mL/min — ABNORMAL LOW (ref >90)
GFR, EST NON AFRICAN AMERICAN: 69 mL/min — AB (ref >90)
Glucose, Bld: 110 mg/dL — ABNORMAL HIGH (ref 70–99)
Potassium: 4 mEq/L (ref 3.7–5.3)
SODIUM: 140 meq/L (ref 137–147)

## 2013-05-27 LAB — HEPATIC FUNCTION PANEL
ALK PHOS: 55 U/L (ref 39–117)
ALT: 55 U/L — ABNORMAL HIGH (ref 0–35)
AST: 57 U/L — ABNORMAL HIGH (ref 0–37)
Albumin: 3.9 g/dL (ref 3.5–5.2)
Total Bilirubin: 0.3 mg/dL (ref 0.3–1.2)
Total Protein: 8.1 g/dL (ref 6.0–8.3)

## 2013-05-27 LAB — AMYLASE: Amylase: 107 U/L — ABNORMAL HIGH (ref 0–105)

## 2013-06-02 ENCOUNTER — Encounter (HOSPITAL_COMMUNITY): Payer: Self-pay | Admitting: *Deleted

## 2013-06-02 ENCOUNTER — Ambulatory Visit (HOSPITAL_COMMUNITY): Payer: Medicare Other | Admitting: Anesthesiology

## 2013-06-02 ENCOUNTER — Ambulatory Visit (HOSPITAL_COMMUNITY)
Admission: RE | Admit: 2013-06-02 | Discharge: 2013-06-03 | Disposition: A | Discharge status: Home / Self Care | Payer: Medicare Other | Source: Ambulatory Visit | Attending: General Surgery | Admitting: General Surgery

## 2013-06-02 ENCOUNTER — Encounter (HOSPITAL_COMMUNITY)
Admission: RE | Disposition: A | Discharge status: Home / Self Care | Payer: Self-pay | Source: Ambulatory Visit | Attending: General Surgery

## 2013-06-02 ENCOUNTER — Encounter (HOSPITAL_COMMUNITY): Payer: Medicare Other | Admitting: Anesthesiology

## 2013-06-02 DIAGNOSIS — E119 Type 2 diabetes mellitus without complications: Secondary | ICD-10-CM | POA: Insufficient documentation

## 2013-06-02 DIAGNOSIS — K8010 Calculus of gallbladder with chronic cholecystitis without obstruction: Secondary | ICD-10-CM | POA: Insufficient documentation

## 2013-06-02 DIAGNOSIS — E039 Hypothyroidism, unspecified: Secondary | ICD-10-CM | POA: Insufficient documentation

## 2013-06-02 DIAGNOSIS — I10 Essential (primary) hypertension: Secondary | ICD-10-CM | POA: Insufficient documentation

## 2013-06-02 DIAGNOSIS — D649 Anemia, unspecified: Secondary | ICD-10-CM | POA: Insufficient documentation

## 2013-06-02 DIAGNOSIS — K219 Gastro-esophageal reflux disease without esophagitis: Secondary | ICD-10-CM | POA: Insufficient documentation

## 2013-06-02 DIAGNOSIS — Z23 Encounter for immunization: Secondary | ICD-10-CM | POA: Insufficient documentation

## 2013-06-02 DIAGNOSIS — Z87891 Personal history of nicotine dependence: Secondary | ICD-10-CM | POA: Insufficient documentation

## 2013-06-02 DIAGNOSIS — F329 Major depressive disorder, single episode, unspecified: Secondary | ICD-10-CM | POA: Insufficient documentation

## 2013-06-02 DIAGNOSIS — F411 Generalized anxiety disorder: Secondary | ICD-10-CM | POA: Insufficient documentation

## 2013-06-02 DIAGNOSIS — F3289 Other specified depressive episodes: Secondary | ICD-10-CM | POA: Insufficient documentation

## 2013-06-02 HISTORY — PX: CHOLECYSTECTOMY: SHX55

## 2013-06-02 LAB — GLUCOSE, CAPILLARY
GLUCOSE-CAPILLARY: 97 mg/dL (ref 70–99)
GLUCOSE-CAPILLARY: 127 mg/dL — AB (ref 70–99)
Glucose-Capillary: 163 mg/dL — ABNORMAL HIGH (ref 70–99)
Glucose-Capillary: 93 mg/dL (ref 70–99)

## 2013-06-02 SURGERY — LAPAROSCOPIC CHOLECYSTECTOMY
Anesthesia: General | Site: Abdomen

## 2013-06-02 MED ORDER — PROPOFOL 10 MG/ML IV BOLUS
INTRAVENOUS | Status: DC | PRN
  Administered 2013-06-02 (×2): 20 mg via INTRAVENOUS
  Administered 2013-06-02: 30 mg via INTRAVENOUS
  Administered 2013-06-02 (×2): 20 mg via INTRAVENOUS
  Administered 2013-06-02: 10 mg via INTRAVENOUS
  Administered 2013-06-02 (×2): 20 mg via INTRAVENOUS
  Administered 2013-06-02: 30 mg via INTRAVENOUS
  Administered 2013-06-02 (×2): 20 mg via INTRAVENOUS
  Administered 2013-06-02: 150 mg via INTRAVENOUS

## 2013-06-02 MED ORDER — POTASSIUM CHLORIDE IN NACL 20-0.9 MEQ/L-% IV SOLN
INTRAVENOUS | Status: DC
  Administered 2013-06-02 – 2013-06-03 (×2): via INTRAVENOUS

## 2013-06-02 MED ORDER — PROPOFOL 10 MG/ML IV BOLUS
INTRAVENOUS | Status: AC
  Filled 2013-06-02: qty 20

## 2013-06-02 MED ORDER — INSULIN ASPART 100 UNIT/ML ~~LOC~~ SOLN
0.0000 [IU] | Freq: Three times a day (TID) | SUBCUTANEOUS | Status: DC
  Administered 2013-06-02: 2 [IU] via SUBCUTANEOUS
  Administered 2013-06-02 – 2013-06-03 (×3): 1 [IU] via SUBCUTANEOUS
  Filled 2013-06-02: qty 0.09

## 2013-06-02 MED ORDER — SODIUM CHLORIDE 0.9 % IR SOLN
Status: DC | PRN
  Administered 2013-06-02: 1000 mL
  Administered 2013-06-02: 3000 mL

## 2013-06-02 MED ORDER — DEXAMETHASONE SODIUM PHOSPHATE 4 MG/ML IJ SOLN
INTRAMUSCULAR | Status: AC
  Filled 2013-06-02: qty 1

## 2013-06-02 MED ORDER — DEXTROSE 5 % IV SOLN
2.0000 g | Freq: Once | INTRAVENOUS | Status: AC
  Administered 2013-06-02: 2 g via INTRAVENOUS
  Filled 2013-06-02: qty 2

## 2013-06-02 MED ORDER — INSULIN DETEMIR 100 UNIT/ML ~~LOC~~ SOLN
12.0000 [IU] | Freq: Two times a day (BID) | SUBCUTANEOUS | Status: DC
  Administered 2013-06-02 – 2013-06-03 (×2): 12 [IU] via SUBCUTANEOUS
  Filled 2013-06-02 (×9): qty 0.12

## 2013-06-02 MED ORDER — MIDAZOLAM HCL 2 MG/2ML IJ SOLN
INTRAMUSCULAR | Status: AC
  Filled 2013-06-02: qty 2

## 2013-06-02 MED ORDER — NEOSTIGMINE METHYLSULFATE 1 MG/ML IJ SOLN
INTRAMUSCULAR | Status: DC | PRN
  Administered 2013-06-02: 2 mg via INTRAVENOUS

## 2013-06-02 MED ORDER — SUCCINYLCHOLINE CHLORIDE 20 MG/ML IJ SOLN
INTRAMUSCULAR | Status: DC | PRN
  Administered 2013-06-02: 120 mg via INTRAVENOUS

## 2013-06-02 MED ORDER — MORPHINE SULFATE 2 MG/ML IJ SOLN
1.0000 mg | INTRAMUSCULAR | Status: DC | PRN
  Administered 2013-06-02 – 2013-06-03 (×5): 1 mg via INTRAVENOUS
  Filled 2013-06-02 (×5): qty 1

## 2013-06-02 MED ORDER — ONDANSETRON HCL 4 MG/2ML IJ SOLN
4.0000 mg | Freq: Once | INTRAMUSCULAR | Status: AC
  Administered 2013-06-02: 4 mg via INTRAVENOUS
  Filled 2013-06-02: qty 2

## 2013-06-02 MED ORDER — FENTANYL CITRATE 0.05 MG/ML IJ SOLN
INTRAMUSCULAR | Status: AC
  Filled 2013-06-02: qty 5

## 2013-06-02 MED ORDER — SUCCINYLCHOLINE CHLORIDE 20 MG/ML IJ SOLN
INTRAMUSCULAR | Status: AC
  Filled 2013-06-02: qty 1

## 2013-06-02 MED ORDER — FENTANYL CITRATE 0.05 MG/ML IJ SOLN
INTRAMUSCULAR | Status: DC | PRN
  Administered 2013-06-02 (×2): 25 ug via INTRAVENOUS
  Administered 2013-06-02: 50 ug via INTRAVENOUS
  Administered 2013-06-02 (×2): 25 ug via INTRAVENOUS
  Administered 2013-06-02 (×2): 50 ug via INTRAVENOUS
  Administered 2013-06-02 (×4): 25 ug via INTRAVENOUS

## 2013-06-02 MED ORDER — ONDANSETRON HCL 4 MG/2ML IJ SOLN
4.0000 mg | Freq: Once | INTRAMUSCULAR | Status: AC | PRN
  Administered 2013-06-02: 4 mg via INTRAVENOUS
  Filled 2013-06-02: qty 2

## 2013-06-02 MED ORDER — DEXAMETHASONE SODIUM PHOSPHATE 4 MG/ML IJ SOLN
4.0000 mg | Freq: Once | INTRAMUSCULAR | Status: AC
  Administered 2013-06-02: 4 mg via INTRAVENOUS

## 2013-06-02 MED ORDER — GLYCOPYRROLATE 0.2 MG/ML IJ SOLN
INTRAMUSCULAR | Status: AC
  Filled 2013-06-02: qty 2

## 2013-06-02 MED ORDER — DOCUSATE SODIUM 100 MG PO CAPS
100.0000 mg | ORAL_CAPSULE | Freq: Every day | ORAL | Status: DC
  Administered 2013-06-02 – 2013-06-03 (×2): 100 mg via ORAL
  Filled 2013-06-02 (×3): qty 1

## 2013-06-02 MED ORDER — PANTOPRAZOLE SODIUM 40 MG PO PACK
40.0000 mg | PACK | Freq: Every day | ORAL | Status: DC
  Administered 2013-06-02 – 2013-06-03 (×2): 40 mg via ORAL
  Filled 2013-06-02 (×5): qty 20

## 2013-06-02 MED ORDER — MIDAZOLAM HCL 2 MG/2ML IJ SOLN
1.0000 mg | INTRAMUSCULAR | Status: AC | PRN
  Administered 2013-06-02 (×3): 2 mg via INTRAVENOUS
  Filled 2013-06-02 (×2): qty 2

## 2013-06-02 MED ORDER — ONDANSETRON HCL 4 MG PO TABS: 4.0000 mg | ORAL_TABLET | Freq: Four times a day (QID) | ORAL | Status: DC | PRN

## 2013-06-02 MED ORDER — LISINOPRIL 5 MG PO TABS
5.0000 mg | ORAL_TABLET | Freq: Every day | ORAL | Status: DC
  Administered 2013-06-02 – 2013-06-03 (×2): 5 mg via ORAL
  Filled 2013-06-02 (×2): qty 1

## 2013-06-02 MED ORDER — SIMVASTATIN 20 MG PO TABS
40.0000 mg | ORAL_TABLET | Freq: Every day | ORAL | Status: DC
  Administered 2013-06-02 – 2013-06-03 (×2): 40 mg via ORAL
  Filled 2013-06-02 (×2): qty 2

## 2013-06-02 MED ORDER — HEMOSTATIC AGENTS (NO CHARGE) OPTIME
TOPICAL | Status: DC | PRN
  Administered 2013-06-02: 1

## 2013-06-02 MED ORDER — ROCURONIUM BROMIDE 50 MG/5ML IV SOLN
INTRAVENOUS | Status: AC
  Filled 2013-06-02: qty 1

## 2013-06-02 MED ORDER — LORAZEPAM 0.5 MG PO TABS
0.5000 mg | ORAL_TABLET | Freq: Every day | ORAL | Status: DC | PRN
  Administered 2013-06-02: 0.5 mg via ORAL
  Filled 2013-06-02: qty 1

## 2013-06-02 MED ORDER — ROCURONIUM BROMIDE 100 MG/10ML IV SOLN
INTRAVENOUS | Status: DC | PRN
  Administered 2013-06-02: 5 mg via INTRAVENOUS
  Administered 2013-06-02 (×2): 10 mg via INTRAVENOUS
  Administered 2013-06-02: 25 mg via INTRAVENOUS

## 2013-06-02 MED ORDER — BUPIVACAINE HCL (PF) 0.5 % IJ SOLN
INTRAMUSCULAR | Status: AC
  Filled 2013-06-02: qty 30

## 2013-06-02 MED ORDER — WATER FOR IRRIGATION, STERILE IR SOLN
Status: DC | PRN
Start: 2013-06-02 — End: 2013-06-02
  Administered 2013-06-02 (×2): 1000 mL

## 2013-06-02 MED ORDER — BUPIVACAINE HCL (PF) 0.5 % IJ SOLN
INTRAMUSCULAR | Status: DC | PRN
  Administered 2013-06-02: 14 mL

## 2013-06-02 MED ORDER — GLYCOPYRROLATE 0.2 MG/ML IJ SOLN
INTRAMUSCULAR | Status: DC | PRN
Start: 2013-06-02 — End: 2013-06-02
  Administered 2013-06-02: 0.4 mg via INTRAVENOUS

## 2013-06-02 MED ORDER — LIDOCAINE HCL (PF) 1 % IJ SOLN
INTRAMUSCULAR | Status: AC
  Filled 2013-06-02: qty 5

## 2013-06-02 MED ORDER — VENLAFAXINE HCL 37.5 MG PO TABS
37.5000 mg | ORAL_TABLET | Freq: Every day | ORAL | Status: DC
  Administered 2013-06-03: 37.5 mg via ORAL
  Filled 2013-06-02: qty 1

## 2013-06-02 MED ORDER — GLYCOPYRROLATE 0.2 MG/ML IJ SOLN
0.2000 mg | Freq: Once | INTRAMUSCULAR | Status: AC
  Administered 2013-06-02: 0.2 mg via INTRAVENOUS

## 2013-06-02 MED ORDER — PNEUMOCOCCAL VAC POLYVALENT 25 MCG/0.5ML IJ INJ
0.5000 mL | INJECTION | INTRAMUSCULAR | Status: AC
  Administered 2013-06-03: 0.5 mL via INTRAMUSCULAR
  Filled 2013-06-02: qty 0.5

## 2013-06-02 MED ORDER — FENTANYL CITRATE 0.05 MG/ML IJ SOLN
INTRAMUSCULAR | Status: AC
  Filled 2013-06-02: qty 2

## 2013-06-02 MED ORDER — LACTATED RINGERS IV SOLN
INTRAVENOUS | Status: DC
Start: 2013-06-02 — End: 2013-06-02
  Administered 2013-06-02: 1000 mL via INTRAVENOUS
  Administered 2013-06-02: 08:00:00 via INTRAVENOUS

## 2013-06-02 MED ORDER — VENLAFAXINE HCL 37.5 MG PO TABS
75.0000 mg | ORAL_TABLET | Freq: Every day | ORAL | Status: DC
  Administered 2013-06-03: 75 mg via ORAL
  Filled 2013-06-02: qty 2

## 2013-06-02 MED ORDER — OXYBUTYNIN CHLORIDE 5 MG PO TABS
5.0000 mg | ORAL_TABLET | Freq: Every day | ORAL | Status: DC
  Administered 2013-06-02 – 2013-06-03 (×2): 5 mg via ORAL
  Filled 2013-06-02 (×2): qty 1

## 2013-06-02 MED ORDER — ONDANSETRON HCL 4 MG/2ML IJ SOLN
4.0000 mg | Freq: Four times a day (QID) | INTRAMUSCULAR | Status: DC | PRN
  Administered 2013-06-02: 4 mg via INTRAVENOUS
  Filled 2013-06-02: qty 2

## 2013-06-02 MED ORDER — GLYCOPYRROLATE 0.2 MG/ML IJ SOLN
INTRAMUSCULAR | Status: AC
  Filled 2013-06-02: qty 1

## 2013-06-02 MED ORDER — METFORMIN HCL 500 MG PO TABS
500.0000 mg | ORAL_TABLET | Freq: Two times a day (BID) | ORAL | Status: DC
  Administered 2013-06-02 – 2013-06-03 (×3): 500 mg via ORAL
  Filled 2013-06-02 (×3): qty 1

## 2013-06-02 MED ORDER — FENTANYL CITRATE 0.05 MG/ML IJ SOLN
25.0000 ug | INTRAMUSCULAR | Status: DC | PRN
  Administered 2013-06-02 (×2): 50 ug via INTRAVENOUS
  Administered 2013-06-02: 25 ug via INTRAVENOUS
  Administered 2013-06-02: 50 ug via INTRAVENOUS
  Filled 2013-06-02: qty 2

## 2013-06-02 MED ORDER — LIDOCAINE HCL (CARDIAC) 10 MG/ML IV SOLN
INTRAVENOUS | Status: DC | PRN
  Administered 2013-06-02: 10 mg via INTRAVENOUS

## 2013-06-02 SURGICAL SUPPLY — 65 items
APPLICATOR COTTON TIP 6IN STRL (MISCELLANEOUS) ×3 IMPLANT
APPLIER CLIP LAPSCP 10X32 DD (CLIP) ×3 IMPLANT
BAG HAMPER (MISCELLANEOUS) ×3 IMPLANT
BAG SPEC RTRVL LRG 6X4 10 (ENDOMECHANICALS) ×1
BLADE SURG 15 STRL LF DISP TIS (BLADE) ×1 IMPLANT
BLADE SURG 15 STRL SS (BLADE) ×3
CLOTH BEACON ORANGE TIMEOUT ST (SAFETY) ×3 IMPLANT
CONT SPECI 4OZ STER CLIK (MISCELLANEOUS) ×2 IMPLANT
COVER LIGHT HANDLE STERIS (MISCELLANEOUS) ×6 IMPLANT
COVER MAYO STAND XLG (DRAPE) ×2 IMPLANT
DECANTER SPIKE VIAL GLASS SM (MISCELLANEOUS) ×3 IMPLANT
DISSECTOR BLUNT TIP ENDO 5MM (MISCELLANEOUS) ×3 IMPLANT
DRSG GAUZE PETRO 3X36 STRIP (GAUZE/BANDAGES/DRESSINGS) ×2 IMPLANT
DRSG TEGADERM 2-3/8X2-3/4 SM (GAUZE/BANDAGES/DRESSINGS) ×9 IMPLANT
ELECT REM PT RETURN 9FT ADLT (ELECTROSURGICAL) ×3
ELECTRODE REM PT RTRN 9FT ADLT (ELECTROSURGICAL) ×1 IMPLANT
EVACUATOR DRAINAGE 10X20 100CC (DRAIN) ×1 IMPLANT
EVACUATOR SILICONE 100CC (DRAIN) ×3
FILTER SMOKE EVAC LAPAROSHD (FILTER) ×3 IMPLANT
FORMALIN 10 PREFIL 120ML (MISCELLANEOUS) ×3 IMPLANT
GLOVE BIOGEL PI IND STRL 7.0 (GLOVE) IMPLANT
GLOVE BIOGEL PI IND STRL 8.5 (GLOVE) IMPLANT
GLOVE BIOGEL PI INDICATOR 7.0 (GLOVE) ×4
GLOVE BIOGEL PI INDICATOR 8.5 (GLOVE) ×2
GLOVE ECLIPSE 6.5 STRL STRAW (GLOVE) ×2 IMPLANT
GLOVE ECLIPSE 8.5 STRL (GLOVE) ×2 IMPLANT
GLOVE EXAM NITRILE LRG STRL (GLOVE) ×2 IMPLANT
GLOVE SKINSENSE NS SZ7.0 (GLOVE) ×4
GLOVE SKINSENSE STRL SZ7.0 (GLOVE) ×1 IMPLANT
GLOVE SS BIOGEL STRL SZ 6.5 (GLOVE) IMPLANT
GLOVE SUPERSENSE BIOGEL SZ 6.5 (GLOVE) ×2
GOWN STRL REUS W/TWL LRG LVL3 (GOWN DISPOSABLE) ×9 IMPLANT
HEMOSTAT SURGICEL 4X8 (HEMOSTASIS) ×3 IMPLANT
INST SET LAPROSCOPIC AP (KITS) ×3 IMPLANT
IV NS IRRIG 3000ML ARTHROMATIC (IV SOLUTION) ×3 IMPLANT
KIT ROOM TURNOVER APOR (KITS) ×3 IMPLANT
MANIFOLD NEPTUNE II (INSTRUMENTS) ×3 IMPLANT
NDL INSUFFLATION 14GA 120MM (NEEDLE) ×1 IMPLANT
NEEDLE INSUFFLATION 14GA 120MM (NEEDLE) ×3 IMPLANT
NS IRRIG 1000ML POUR BTL (IV SOLUTION) ×3 IMPLANT
PACK LAP CHOLE LZT030E (CUSTOM PROCEDURE TRAY) ×3 IMPLANT
PAD ARMBOARD 7.5X6 YLW CONV (MISCELLANEOUS) ×3 IMPLANT
PENCIL HANDSWITCHING (ELECTRODE) ×2 IMPLANT
POUCH SPECIMEN RETRIEVAL 10MM (ENDOMECHANICALS) ×3 IMPLANT
SET BASIN LINEN APH (SET/KITS/TRAYS/PACK) ×3 IMPLANT
SET TUBE IRRIG SUCTION NO TIP (IRRIGATION / IRRIGATOR) ×3 IMPLANT
SLEEVE ENDOPATH XCEL 5M (ENDOMECHANICALS) ×3 IMPLANT
SOL PREP PROV IODINE SCRUB 4OZ (MISCELLANEOUS) ×3 IMPLANT
SPONGE DRAIN TRACH 4X4 STRL 2S (GAUZE/BANDAGES/DRESSINGS) ×3 IMPLANT
SPONGE GAUZE 4X4 12PLY (GAUZE/BANDAGES/DRESSINGS) ×3 IMPLANT
SPONGE LAP 18X18 X RAY DECT (DISPOSABLE) ×2 IMPLANT
STAPLER VISISTAT 35W (STAPLE) ×3 IMPLANT
SUT ETHILON 3 0 FSL (SUTURE) ×3 IMPLANT
SUT VIC AB 0 CT1 27 (SUTURE) ×3
SUT VIC AB 0 CT1 27XCR 8 STRN (SUTURE) IMPLANT
SUT VICRYL 0 UR6 27IN ABS (SUTURE) ×5 IMPLANT
TAPE CLOTH SURG 4X10 WHT LF (GAUZE/BANDAGES/DRESSINGS) ×2 IMPLANT
TOWEL OR 17X26 4PK STRL BLUE (TOWEL DISPOSABLE) ×3 IMPLANT
TRAY FOLEY CATH 16FR SILVER (SET/KITS/TRAYS/PACK) ×3 IMPLANT
TROCAR ENDO BLADELESS 11MM (ENDOMECHANICALS) ×3 IMPLANT
TROCAR XCEL NON-BLD 5MMX100MML (ENDOMECHANICALS) ×3 IMPLANT
TROCAR XCEL UNIV SLVE 11M 100M (ENDOMECHANICALS) ×3 IMPLANT
TUBING INSUFFLATION HIGH FLOW (TUBING) ×3 IMPLANT
WARMER LAPAROSCOPE (MISCELLANEOUS) ×3 IMPLANT
WATER STERILE IRR 1000ML POUR (IV SOLUTION) ×6 IMPLANT

## 2013-06-02 NOTE — Transfer of Care (Signed)
Immediate Anesthesia Transfer of Care Note  Patient: Erika Jacobs  Procedure(s) Performed: Procedure(s) (LRB): LAPAROSCOPIC CHOLECYSTECTOMY (N/A)  Patient Location: PACU  Anesthesia Type: General  Level of Consciousness: awake  Airway & Oxygen Therapy: Patient Spontanous Breathing and non-rebreather face mask  Post-op Assessment: Report given to PACU RN, Post -op Vital signs reviewed and stable and Patient moving all extremities  Post vital signs: Reviewed and stable  Complications: No apparent anesthesia complications

## 2013-06-02 NOTE — Anesthesia Procedure Notes (Signed)
Procedure Name: Intubation Date/Time: 06/02/2013 7:41 AM Performed by: Vista Deck Pre-anesthesia Checklist: Patient identified, Patient being monitored, Timeout performed, Emergency Drugs available and Suction available Patient Re-evaluated:Patient Re-evaluated prior to inductionOxygen Delivery Method: Circle System Utilized Preoxygenation: Pre-oxygenation with 100% oxygen Intubation Type: IV induction, Rapid sequence and Cricoid Pressure applied Grade View: Grade I Tube type: Oral Tube size: 7.0 mm Number of attempts: 1 Airway Equipment and Method: stylet and Video-laryngoscopy Placement Confirmation: ETT inserted through vocal cords under direct vision,  positive ETCO2 and breath sounds checked- equal and bilateral Secured at: 21 cm Tube secured with: Tape Dental Injury: Teeth and Oropharynx as per pre-operative assessment

## 2013-06-02 NOTE — Progress Notes (Signed)
89 yr. Old W. Female for elective cholecystectomy for cholelithiasis.  Procedure and risks explained and informed consent obtained.  Labs reviewed.  FBS is 97 and LFS noted with normal bili.  Mild elevation of amylase (107) may have passed CBD stone earlier.   Abdomen however is completely soft and she has complained of no interim change in belly pain or nausea or vomiting and she states she has observed her restrictive pre op diet.    No clinical change since H&P, dict. # [id number].  Filed Vitals:   06/02/13 0700  BP: 149/78  Pulse:   Temp:   Resp: 16  pulse 72/min, temp 97.7, O2 sat 99%

## 2013-06-02 NOTE — Anesthesia Preprocedure Evaluation (Signed)
Anesthesia Evaluation  Patient identified by MRN, date of birth, ID band Patient awake    Reviewed: Allergy & Precautions, H&P , NPO status , Patient's Chart, lab work & pertinent test results  Airway Mallampati: III TM Distance: >3 FB   Mouth opening: Limited Mouth Opening  Dental  (+) Teeth Intact   Pulmonary shortness of breath, former smoker,  breath sounds clear to auscultation        Cardiovascular hypertension, Pt. on medications Rhythm:Regular Rate:Normal     Neuro/Psych Anxiety    GI/Hepatic GERD-  Controlled and Medicated,  Endo/Other  diabetes, Well Controlled, Type 2, Oral Hypoglycemic AgentsHypothyroidism   Renal/GU      Musculoskeletal   Abdominal   Peds  Hematology   Anesthesia Other Findings   Reproductive/Obstetrics                           Anesthesia Physical Anesthesia Plan  ASA: III  Anesthesia Plan: General   Post-op Pain Management:    Induction: Intravenous, Rapid sequence and Cricoid pressure planned  Airway Management Planned: Oral ETT and Video Laryngoscope Planned  Additional Equipment:   Intra-op Plan:   Post-operative Plan: Extubation in OR  Informed Consent: I have reviewed the patients History and Physical, chart, labs and discussed the procedure including the risks, benefits and alternatives for the proposed anesthesia with the patient or authorized representative who has indicated his/her understanding and acceptance.     Plan Discussed with:   Anesthesia Plan Comments:         Anesthesia Quick Evaluation

## 2013-06-02 NOTE — Anesthesia Postprocedure Evaluation (Signed)
Anesthesia Post Note  Patient: Erika Jacobs  Procedure(s) Performed: Procedure(s) (LRB): LAPAROSCOPIC CHOLECYSTECTOMY (N/A)  Anesthesia type: General  Patient location: PACU  Post pain: Pain level controlled  Post assessment: Post-op Vital signs reviewed, Patient's Cardiovascular Status Stable, Respiratory Function Stable, Patent Airway, No signs of Nausea or vomiting and Pain level controlled  Last Vitals:  Filed Vitals:   06/02/13 1011  BP: 139/74  Pulse: 80  Temp: 36.4 C  Resp: 13    Post vital signs: Reviewed and stable  Level of consciousness: awake and alert   Complications: No apparent anesthesia complications

## 2013-06-02 NOTE — Progress Notes (Signed)
Filed Vitals:   06/02/13 1330  BP: 157/78  Pulse: 81  Temp: 97.6 F (36.4 C)  Resp: 18   Awake and alert.  Wound clean and dry.  Serous JP drainage  ~ 35 cc.  Has voided.  Abdomen is soft considerable expected incisional pain from  removing large stone from  Epigastric port site.  Doing well post op.

## 2013-06-02 NOTE — Brief Op Note (Signed)
06/02/2013  9:53 AM  PATIENT:  Erika Jacobs  63 y.o. female  PRE-OPERATIVE DIAGNOSIS:  cholelithiasis  POST-OPERATIVE DIAGNOSIS:  cholelithiasis  PROCEDURE:  Procedure(s): LAPAROSCOPIC CHOLECYSTECTOMY (N/A)  SURGEON:  Surgeon(s) and Role:    * Scherry Ran, MD - Primary  PHYSICIAN ASSISTANT:   ASSISTANTS: none   ANESTHESIA:   general  EBL:  Total I/O In: 1800 [I.V.:1800] Out: 220 [Urine:200; Blood:20]  BLOOD ADMINISTERED:none  DRAINS: penrose drain placed in liver bed.  LOCAL MEDICATIONS USED:  MARCAINE  0.5% ~ 10 cc.   SPECIMEN:  Source of Specimen:  gall bladder and stones.  DISPOSITION OF SPECIMEN:  PATHOLOGY  COUNTS:  YES  TOURNIQUET:  * No tourniquets in log *  DICTATION: .Other Dictation: Dictation Number OR dictation #  [id number].  PLAN OF CARE: Admit for overnight observation  PATIENT DISPOSITION:  PACU - hemodynamically stable.   Delay start of Pharmacological VTE agent (>24hrs) due to surgical blood loss or risk of bleeding: not applicable

## 2013-06-03 ENCOUNTER — Encounter (HOSPITAL_COMMUNITY): Payer: Self-pay | Admitting: General Surgery

## 2013-06-03 LAB — BASIC METABOLIC PANEL
BUN: 11 mg/dL (ref 6–23)
CO2: 25 mEq/L (ref 19–32)
Calcium: 8.4 mg/dL (ref 8.4–10.5)
Chloride: 103 mEq/L (ref 96–112)
Creatinine, Ser: 0.72 mg/dL (ref 0.50–1.10)
GFR calc Af Amer: >90 mL/min (ref >90)
GFR calc non Af Amer: >90 mL/min (ref >90)
GLUCOSE: 112 mg/dL — AB (ref 70–99)
POTASSIUM: 3.7 meq/L (ref 3.7–5.3)
SODIUM: 138 meq/L (ref 137–147)

## 2013-06-03 LAB — GLUCOSE, CAPILLARY
GLUCOSE-CAPILLARY: 143 mg/dL — AB (ref 70–99)
GLUCOSE-CAPILLARY: 141 mg/dL — AB (ref 70–99)
Glucose-Capillary: 150 mg/dL — ABNORMAL HIGH (ref 70–99)
Glucose-Capillary: 109 mg/dL — ABNORMAL HIGH (ref 70–99)

## 2013-06-03 LAB — HEPATIC FUNCTION PANEL
ALBUMIN: 2.9 g/dL — AB (ref 3.5–5.2)
ALT: 41 U/L — AB (ref 0–35)
AST: 56 U/L — ABNORMAL HIGH (ref 0–37)
Alkaline Phosphatase: 47 U/L (ref 39–117)
BILIRUBIN TOTAL: 0.2 mg/dL — AB (ref 0.3–1.2)
Total Protein: 6.5 g/dL (ref 6.0–8.3)

## 2013-06-03 LAB — CBC
HEMATOCRIT: 30.7 % — AB (ref 36.0–46.0)
HEMOGLOBIN: 9.5 g/dL — AB (ref 12.0–15.0)
MCH: 23.1 pg — ABNORMAL LOW (ref 26.0–34.0)
MCHC: 30.9 g/dL (ref 30.0–36.0)
MCV: 74.7 fL — ABNORMAL LOW (ref 78.0–100.0)
Platelets: 155 10*3/uL (ref 150–400)
RBC: 4.11 MIL/uL (ref 3.87–5.11)
RDW: 25 % — ABNORMAL HIGH (ref 11.5–15.5)
WBC: 6 10*3/uL (ref 4.0–10.5)

## 2013-06-03 MED ORDER — DOCUSATE SODIUM 100 MG PO CAPS
100.0000 mg | ORAL_CAPSULE | Freq: Once | ORAL | Status: AC
  Administered 2013-06-03: 100 mg via ORAL

## 2013-06-03 MED ORDER — FLEET ENEMA 7-19 GM/118ML RE ENEM
1.0000 | ENEMA | Freq: Once | RECTAL | Status: AC
  Administered 2013-06-03: 1 via RECTAL

## 2013-06-03 MED ORDER — OXYCODONE-ACETAMINOPHEN 10-325 MG PO TABS: 1.0000 | ORAL_TABLET | ORAL | Status: DC | PRN

## 2013-06-03 MED ORDER — DSS 100 MG PO CAPS: 100.0000 mg | ORAL_CAPSULE | Freq: Two times a day (BID) | ORAL | Status: DC

## 2013-06-03 NOTE — Op Note (Signed)
Erika Jacobs, Erika Jacobs             ACCOUNT NO.:  [id number]  MEDICAL RECORD NO.:  [id number]  LOCATION:  A302                          FACILITY:  [facility]  PHYSICIAN:  Felicie Morn, M.D. DATE OF BIRTH:  Mar 12, 1951  DATE OF PROCEDURE:  06/02/2013 DATE OF DISCHARGE:                              OPERATIVE REPORT   DIAGNOSES: 1. Cholecystitis. 2. Cholelithiasis.  PROCEDURE:  Laparoscopic cholecystectomy.  WOUND CLASSIFICATION:  Clean, contaminated.  SPECIMEN:  Gallbladder and stones.  NOTE:  This is a 63 year old white obese female, who was referred from Dr. Vickey Sages office for cholelithiasis.  Preoperatively, she had a sonogram which revealed multiple stones within the gallbladder with no obvious signs of acute cholecystitis.  The patient's liver function studies showed normal bilirubin and mild elevation of liver function studies.  Amylase was slightly elevated, making me think that possibly at one time she had passed a small stone through the common bile duct which was minimally dilated; however, at that time, she has had no clinical signs of pancreatitis.  We discussed outpatient surgery with her.  We placed her on a restrictive diet.  We discussed the complications of surgery, not limited to, but including bleeding, infection, damage to bile duct, perforation of organs, transitory diarrhea, and the possibility of open surgery might be required.  Informed consent was obtained.  GROSS OPERATIVE FINDINGS:  The patient had a cystic duct which was not enlarged.  She had a gallbladder that despite the sonographic findings was in fact thickened and she did not have multiple small stones.  She had in fact some smaller stones, but actually she had stones, essentially the size of a marble and one very large stone that occupied the entire portion of Hartmann pouch.  It was probably 5 cm in diameter.  TECHNIQUE:  The patient was placed in supine position.  After the adequate  administration of general anesthesia via endotracheal intubation, a Foley catheter was aseptically inserted.  She was prepped with Betadine solution and draped in usual manner.  A periumbilical incision was carried out over the superior aspect of the umbilicus, and we dissected down to the fascia which was grasped with a sharp towel clip and elevated.  A Veress needle was inserted and confirming position with a saline drop test.  We then insufflated the abdomen with CO2 approximately 3.5 L, and inserted the camera.  I had to take down some adhesions to have good visualization of the gallbladder.  There had been a previous tubal ligation.  This was completely uneventful without any bleeding or problems.  We then grasped the gallbladder and dissected the cystic duct, and triply silver clipped this and divided this.  This was a little tedious dissection, but there were no problems.  The cystic artery was likewise identified and ligated.  There was a posterior branch, it was also ligated.  The gallbladder was then removed from the liver bed using the hook cautery device.  There was no spillage of bile or gallbladder stones or any problems.  The gallbladder was then placed in an endosac device and removed through the epigastric incision.  We had to extend this incision somewhat because of the large size of the stone  that basically occupied the proximal portion of the gallbladder. Once this was done, we then checked for hemostasis.  We cauterized any small amount of bleeding in the liver bed.  I elected to leave a Terrial Rhodes drain in the liver bed which exited through one of the lateral most 61-mm cannula sites and then also elected to leave a piece of Surgicel in the liver bed as well.  After irrigating and checking for hemostasis again and looking down from the epigastric cannula as well to make sure that there were no problems and there were none identified. The patient was then  desufflated and the fascia was closed with interrupted figure-of-eight 0 Polysorb sutures in the area of the epigastrium and in the area of the umbilicus.  The wounds were then anesthetized with 0.5% Sensorcaine, approximately 10 mL were used for postoperative comfort at cannula sites.  We then closed the wounds with a stapling device and sutured the drain in place with 3-0 nylon.  Prior to closure, all sponge, needle, and instrument counts were found to be correct.  Estimated blood loss less than 50 mL.  The patient received 1800 mL of crystalloids intraoperatively.  There were no complications.     Felicie Morn, M.D.     WB/MEDQ  D:  06/02/2013  T:  06/03/2013  Job:  [id number]  cc:   Hildred Laser, M.D. Fax: [phone]

## 2013-06-03 NOTE — Addendum Note (Signed)
Addendum created 06/03/13 1116 by Vista Deck, CRNA   Modules edited: Notes Section   Notes Section:  File: [id number]

## 2013-06-03 NOTE — Progress Notes (Signed)
Advanced the patients orders per advance as tolerated orders.  Pt requested her diet be increased.  She stated with lunch she ate her sherbert abd some soup, but stopped due to pain in the right flank area.  Will continue to monitor.

## 2013-06-03 NOTE — Progress Notes (Signed)
POD #1  Wound clean and redressed.  Minimal JP drainage which is serous in nature.  She has incisional pain; abdomen however is soft and patient is hungry.  Labs reviewed.  Bili is normal. She has her usual anemia that is being treated with Fe by med service.  (colonoscopy and UGI endoscopy done recently as OP was normal) Clinically no post op bleeding.  Filed Vitals:   06/03/13 0858  BP: 125/76  Pulse: 82  Temp:   Resp:   Temp 97.7 resp 18/min  Blood sugar under control.  Will return later and after pt is able to ambulate a little more independently, will discharge.

## 2013-06-03 NOTE — Progress Notes (Signed)
Filed Vitals:   06/03/13 1423  BP: 129/72  Pulse: 90  Temp: 97.8 F (36.6 C)  Resp: 20  Abdomen soft.  Some, mostly serous,drainage from drain site.  All surgical sites clean and redressed.  Have removed JP.Marland Kitchen  No increase in drainage.    Abdomen soft. Pt tolerated PO  But has not had a BM since in hospital.  Will give her a Fleets enema prior to her discharge (as er her request as well) and she is to continue stool softeners.  Pt voiding well without dysuria.  No leg pain or shortness of breath or chest pain. Blood sugars under control and discharge and follow up arranged.  Discharge dict# [id number].

## 2013-06-03 NOTE — Anesthesia Postprocedure Evaluation (Signed)
Anesthesia Post Note  Patient: Erika Jacobs  Procedure(s) Performed: Procedure(s) (LRB): LAPAROSCOPIC CHOLECYSTECTOMY (N/A)  Anesthesia type: General  Patient location: 302  Post pain: Pain level controlled  Post assessment: Post-op Vital signs reviewed, Patient's Cardiovascular Status Stable, Respiratory Function Stable, Patent Airway, No signs of Nausea or vomiting and Pain level controlled  Post vital signs: Reviewed and stable  Level of consciousness: awake and alert   Complications: No apparent anesthesia complications

## 2013-06-03 NOTE — Progress Notes (Signed)
Notified Dr. Romona Curls while he was seeing the patient that the patient states the medication for pain is not effective.  MD spoke with patient about the pain.

## 2013-06-04 NOTE — Discharge Summary (Signed)
Erika Jacobs, Erika Jacobs             ACCOUNT NO.:  [id number]  MEDICAL RECORD NO.:  [id number]  LOCATION:  A302                          FACILITY:  [facility]  PHYSICIAN:  Felicie Morn, M.D. DATE OF BIRTH:  1951/03/09  DATE OF ADMISSION:  06/02/2013 DATE OF DISCHARGE:  02/13/2015LH                              DISCHARGE SUMMARY   DIAGNOSES: 1. Cholecystitis. 2. Cholelithiasis.  SECONDARY DIAGNOSES: 1. Type 2 diabetes. 2. Hypertension. 3. History of anxiety and depression. 4. Anemia (likely iron-deficiency anemia and being followed by the     Medical Service as an outpatient).  PROCEDURE:  On June 02, 2013, laparoscopic cholecystectomy.  NOTE:  This is a 63 year old obese white female who was referred for cholecystitis, cholelithiasis.  She was taken to surgery via the outpatient department where she was noted to have multiple large stones within the gallbladder.  She underwent an uneventful laparoscopic cholecystectomy and she was discharged on the following day.  She had some incisional pain, which was because we had to extend the incision somewhat to remove rather large stone, but she had no problems with any significant bump in her liver function studies with her bilirubin remaining within normal limits, and her drainage from her Jackson-Pratt drain was mostly serous in nature and not a problem.  At the time of her discharge, her wounds were clean.  There was no sign of any infection. She had no dysuria, leg pain, shortness of breath. Or chest pains.  She was a little sluggish to get ambulating, but this is not unusual for her considering her obesity, her general activity level preoperatively.  We will follow her perioperatively.  Discharge instructions were given and wound care was given and she is told to follow up with Dr. Karie Kirks regarding her continued followup for diabetes, hypertension, and anemia. We will follow her perioperatively after which she is to return to  Dr. Karie Kirks for any medical problems that she may have in the future.     Felicie Morn, M.D.     WB/MEDQ  D:  06/03/2013  T:  06/04/2013  Job:  [id number]  cc:   Estill Bamberg. Karie Kirks, M.D. Fax: [phone]

## 2013-07-31 ENCOUNTER — Emergency Department (HOSPITAL_COMMUNITY)
Admission: EM | Admit: 2013-07-31 | Discharge: 2013-07-31 | Discharge status: Against Medical Advice | Payer: Medicare Other | Attending: Emergency Medicine | Admitting: Emergency Medicine

## 2013-07-31 ENCOUNTER — Encounter (HOSPITAL_COMMUNITY): Payer: Self-pay | Admitting: Emergency Medicine

## 2013-07-31 DIAGNOSIS — J392 Other diseases of pharynx: Secondary | ICD-10-CM | POA: Insufficient documentation

## 2013-07-31 DIAGNOSIS — K219 Gastro-esophageal reflux disease without esophagitis: Secondary | ICD-10-CM | POA: Insufficient documentation

## 2013-07-31 DIAGNOSIS — E7800 Pure hypercholesterolemia, unspecified: Secondary | ICD-10-CM | POA: Insufficient documentation

## 2013-07-31 DIAGNOSIS — Z8739 Personal history of other diseases of the musculoskeletal system and connective tissue: Secondary | ICD-10-CM | POA: Insufficient documentation

## 2013-07-31 DIAGNOSIS — F3289 Other specified depressive episodes: Secondary | ICD-10-CM | POA: Insufficient documentation

## 2013-07-31 DIAGNOSIS — Z79899 Other long term (current) drug therapy: Secondary | ICD-10-CM | POA: Insufficient documentation

## 2013-07-31 DIAGNOSIS — I10 Essential (primary) hypertension: Secondary | ICD-10-CM | POA: Insufficient documentation

## 2013-07-31 DIAGNOSIS — Z87448 Personal history of other diseases of urinary system: Secondary | ICD-10-CM | POA: Insufficient documentation

## 2013-07-31 DIAGNOSIS — R4189 Other symptoms and signs involving cognitive functions and awareness: Secondary | ICD-10-CM

## 2013-07-31 DIAGNOSIS — F329 Major depressive disorder, single episode, unspecified: Secondary | ICD-10-CM | POA: Insufficient documentation

## 2013-07-31 DIAGNOSIS — Z862 Personal history of diseases of the blood and blood-forming organs and certain disorders involving the immune mechanism: Secondary | ICD-10-CM | POA: Insufficient documentation

## 2013-07-31 DIAGNOSIS — Z87891 Personal history of nicotine dependence: Secondary | ICD-10-CM | POA: Insufficient documentation

## 2013-07-31 DIAGNOSIS — Z794 Long term (current) use of insulin: Secondary | ICD-10-CM | POA: Insufficient documentation

## 2013-07-31 DIAGNOSIS — E119 Type 2 diabetes mellitus without complications: Secondary | ICD-10-CM | POA: Insufficient documentation

## 2013-07-31 LAB — URINALYSIS, ROUTINE W REFLEX MICROSCOPIC
Bilirubin Urine: NEGATIVE
Glucose, UA: NEGATIVE mg/dL
Hgb urine dipstick: NEGATIVE
KETONES UR: NEGATIVE mg/dL
Leukocytes, UA: NEGATIVE
NITRITE: NEGATIVE
PH: 5.5 (ref 5.0–8.0)
Protein, ur: NEGATIVE mg/dL
Specific Gravity, Urine: <1.005 — ABNORMAL LOW (ref 1.005–1.030)
UROBILINOGEN UA: 0.2 mg/dL (ref 0.0–1.0)

## 2013-07-31 LAB — COMPREHENSIVE METABOLIC PANEL
ALK PHOS: 68 U/L (ref 39–117)
ALT: 54 U/L — AB (ref 0–35)
AST: 45 U/L — AB (ref 0–37)
Albumin: 3.6 g/dL (ref 3.5–5.2)
BILIRUBIN TOTAL: 0.2 mg/dL — AB (ref 0.3–1.2)
BUN: 16 mg/dL (ref 6–23)
CHLORIDE: 98 meq/L (ref 96–112)
CO2: 25 mEq/L (ref 19–32)
Calcium: 9.7 mg/dL (ref 8.4–10.5)
Creatinine, Ser: 0.78 mg/dL (ref 0.50–1.10)
GFR calc Af Amer: >90 mL/min (ref >90)
GFR calc non Af Amer: 89 mL/min — ABNORMAL LOW (ref >90)
Glucose, Bld: 139 mg/dL — ABNORMAL HIGH (ref 70–99)
POTASSIUM: 4.6 meq/L (ref 3.7–5.3)
SODIUM: 136 meq/L — AB (ref 137–147)
TOTAL PROTEIN: 8.2 g/dL (ref 6.0–8.3)

## 2013-07-31 LAB — RAPID URINE DRUG SCREEN, HOSP PERFORMED
Amphetamines: NOT DETECTED
BARBITURATES: NOT DETECTED
BENZODIAZEPINES: NOT DETECTED
Cocaine: NOT DETECTED
Opiates: NOT DETECTED
TETRAHYDROCANNABINOL: NOT DETECTED

## 2013-07-31 LAB — CBC WITH DIFFERENTIAL/PLATELET
BASOS ABS: 0 10*3/uL (ref 0.0–0.1)
BASOS PCT: 0 % (ref 0–1)
Eosinophils Absolute: 0.1 10*3/uL (ref 0.0–0.7)
Eosinophils Relative: 2 % (ref 0–5)
HCT: 34.9 % — ABNORMAL LOW (ref 36.0–46.0)
HEMOGLOBIN: 11 g/dL — AB (ref 12.0–15.0)
Lymphocytes Relative: 37 % (ref 12–46)
Lymphs Abs: 1.8 10*3/uL (ref 0.7–4.0)
MCH: 24.2 pg — ABNORMAL LOW (ref 26.0–34.0)
MCHC: 31.5 g/dL (ref 30.0–36.0)
MCV: 76.7 fL — ABNORMAL LOW (ref 78.0–100.0)
MONOS PCT: 6 % (ref 3–12)
Monocytes Absolute: 0.3 10*3/uL (ref 0.1–1.0)
NEUTROS ABS: 2.7 10*3/uL (ref 1.7–7.7)
NEUTROS PCT: 55 % (ref 43–77)
PLATELETS: 180 10*3/uL (ref 150–400)
RBC: 4.55 MIL/uL (ref 3.87–5.11)
RDW: 17.4 % — ABNORMAL HIGH (ref 11.5–15.5)
WBC: 4.9 10*3/uL (ref 4.0–10.5)

## 2013-07-31 LAB — ETHANOL: Alcohol, Ethyl (B): <11 mg/dL (ref 0–11)

## 2013-07-31 NOTE — ED Notes (Signed)
Patient stated she needs help with her meds because her thinking is not right or clear. Denies suicidal or homicidal ideation. Female at the bedside

## 2013-07-31 NOTE — ED Provider Notes (Signed)
CSN: [id number]     Arrival date & time 07/31/13  0303 History   First MD Initiated Contact with Patient 07/31/13 [phone]     Chief Complaint  Patient presents with  . Medical Clearance    need help with meds for anxiety     (Consider location/radiation/quality/duration/timing/severity/associated sxs/prior Treatment) The history is provided by the patient.   63 year old female states that she needs to be admitted for a few days for psychiatric evaluation to make sure that her medications are. She states that the last several days, her thoughts or spinning around in her head and she has difficulty expressing herself. She does have a history of depression and she is on an antidepressant medications which are prescribed by her PCP. She had seen a psychiatrist years ago but she went all other medications to come from one source. She did make an appointment with him but her son told her that she needed to get admitted so she came in tonight. She denies suicidal or homicidal ideation. She denies hallucinations. She denies early morning awakening, crying spells, and anhedonia.  Past Medical History  Diagnosis Date  . Diabetes mellitus without complication   . Panic attack   . Hypertension   . Hypercholesteremia   . GERD (gastroesophageal reflux disease)   . Hypothyroid   . Overactive bladder   . Arthritis   . Anemia   . Shortness of breath    Past Surgical History  Procedure Laterality Date  . Tubal ligation    . Eye surgery Left   . Colonoscopy with esophagogastroduodenoscopy (egd) N/A 05/20/2013    Procedure: COLONOSCOPY WITH ESOPHAGOGASTRODUODENOSCOPY (EGD);  Surgeon: Rogene Houston, MD;  Location: [facility];  Service: Endoscopy;  Laterality: N/A;  730  . Cholecystectomy N/A 06/02/2013    Procedure: LAPAROSCOPIC CHOLECYSTECTOMY;  Surgeon: Scherry Ran, MD;  Location: [facility];  Service: General;  Laterality: N/A;   History reviewed. No pertinent family history. History   Substance Use Topics  . Smoking status: Former Smoker -- 2.00 packs/day for 30 years    Types: Cigarettes    Quit date: 05/20/2002  . Smokeless tobacco: Not on file  . Alcohol Use: No   OB History   Grav Para Term Preterm Abortions TAB SAB Ect Mult Living                 Review of Systems  All other systems reviewed and are negative.     Allergies  Review of patient's allergies indicates no known allergies.  Home Medications   Current Outpatient Rx  Name  Route  Sig  Dispense  Refill  . Ascorbic Acid (VITAMIN C) 1000 MG tablet   Oral   Take 1,000 mg by mouth daily.         . Cholecalciferol (VITAMIN D) 2000 UNITS CAPS   Oral   Take 1 capsule by mouth daily.         Marland Kitchen docusate sodium 100 MG CAPS   Oral   Take 100 mg by mouth 2 (two) times daily.   10 capsule   0   . esomeprazole (NEXIUM) 40 MG capsule   Oral   Take 40 mg by mouth daily at 12 noon.         . insulin detemir (LEVEMIR) 100 UNIT/ML injection   Subcutaneous   Inject 12 Units into the skin 2 (two) times daily.          . iron polysaccharides (NIFEREX) 150 MG capsule  Oral   Take 150 mg by mouth daily.         Marland Kitchen lisinopril (PRINIVIL,ZESTRIL) 5 MG tablet   Oral   Take 5 mg by mouth daily.         Marland Kitchen LORazepam (ATIVAN) 0.5 MG tablet   Oral   Take 0.5 mg by mouth daily as needed for anxiety.         . metFORMIN (GLUCOPHAGE) 500 MG tablet   Oral   Take 500 mg by mouth 2 (two) times daily with a meal.          . oxybutynin (DITROPAN) 5 MG tablet   Oral   Take 5 mg by mouth daily.         Marland Kitchen oxyCODONE-acetaminophen (PERCOCET) 10-325 MG per tablet   Oral   Take 1 tablet by mouth every 4 (four) hours as needed for pain.   30 tablet   0   . pravastatin (PRAVACHOL) 40 MG tablet   Oral   Take 40 mg by mouth daily.         Marland Kitchen venlafaxine (EFFEXOR) 37.5 MG tablet   Oral   Take 37.5 mg by mouth daily.         Marland Kitchen venlafaxine (EFFEXOR) 75 MG tablet   Oral   Take 75 mg by  mouth daily.         . vitamin B-12 (CYANOCOBALAMIN) 1000 MCG tablet   Oral   Take 1,000 mcg by mouth daily.          BP 153/90  Pulse 78  Temp(Src) 97.7 F (36.5 C) (Oral)  Resp 22  Ht 5' (1.524 m)  Wt 180 lb (81.647 kg)  BMI 35.15 kg/m2  SpO2 97% Physical Exam  Nursing note and vitals reviewed.  63 year old female, resting comfortably and in no acute distress. Vital signs are significant for tachypnea with respiratory rate of 22, and hypertension with blood pressure 153/90. Oxygen saturation is 97%, which is normal. Head is normocephalic and atraumatic. PERRLA, EOMI. Oropharynx is clear. Neck is nontender and supple without adenopathy or JVD. Back is nontender and there is no CVA tenderness. Lungs are clear without rales, wheezes, or rhonchi. Chest is nontender. Heart has regular rate and rhythm without murmur. Abdomen is soft, flat, nontender without masses or hepatosplenomegaly and peristalsis is normoactive. Extremities have no cyanosis or edema, full range of motion is present. Skin is warm and dry without rash. Neurologic: Mental status is normal, cranial nerves are intact, there are no motor or sensory deficits.  Psychiatric: Thoughts are somewhat scattered but no other obvious psychiatric abnormalities appear  ED Course  Procedures (including critical care time) Labs Review Results for orders placed during the hospital encounter of 07/31/13  URINALYSIS, ROUTINE W REFLEX MICROSCOPIC      Result Value Ref Range   Color, Urine YELLOW  YELLOW   APPearance CLEAR  CLEAR   Specific Gravity, Urine <1.005 (*) 1.005 - 1.030   pH 5.5  5.0 - 8.0   Glucose, UA NEGATIVE  NEGATIVE mg/dL   Hgb urine dipstick NEGATIVE  NEGATIVE   Bilirubin Urine NEGATIVE  NEGATIVE   Ketones, ur NEGATIVE  NEGATIVE mg/dL   Protein, ur NEGATIVE  NEGATIVE mg/dL   Urobilinogen, UA 0.2  0.0 - 1.0 mg/dL   Nitrite NEGATIVE  NEGATIVE   Leukocytes, UA NEGATIVE  NEGATIVE  CBC WITH DIFFERENTIAL       Result Value Ref Range   WBC 4.9  4.0 -  10.5 K/uL   RBC 4.55  3.87 - 5.11 MIL/uL   Hemoglobin 11.0 (*) 12.0 - 15.0 g/dL   HCT 34.9 (*) 36.0 - 46.0 %   MCV 76.7 (*) 78.0 - 100.0 fL   MCH 24.2 (*) 26.0 - 34.0 pg   MCHC 31.5  30.0 - 36.0 g/dL   RDW 17.4 (*) 11.5 - 15.5 %   Platelets 180  150 - 400 K/uL   Neutrophils Relative % 55  43 - 77 %   Neutro Abs 2.7  1.7 - 7.7 K/uL   Lymphocytes Relative 37  12 - 46 %   Lymphs Abs 1.8  0.7 - 4.0 K/uL   Monocytes Relative 6  3 - 12 %   Monocytes Absolute 0.3  0.1 - 1.0 K/uL   Eosinophils Relative 2  0 - 5 %   Eosinophils Absolute 0.1  0.0 - 0.7 K/uL   Basophils Relative 0  0 - 1 %   Basophils Absolute 0.0  0.0 - 0.1 K/uL  COMPREHENSIVE METABOLIC PANEL      Result Value Ref Range   Sodium 136 (*) 137 - 147 mEq/L   Potassium 4.6  3.7 - 5.3 mEq/L   Chloride 98  96 - 112 mEq/L   CO2 25  19 - 32 mEq/L   Glucose, Bld 139 (*) 70 - 99 mg/dL   BUN 16  6 - 23 mg/dL   Creatinine, Ser 0.78  0.50 - 1.10 mg/dL   Calcium 9.7  8.4 - 10.5 mg/dL   Total Protein 8.2  6.0 - 8.3 g/dL   Albumin 3.6  3.5 - 5.2 g/dL   AST 45 (*) 0 - 37 U/L   ALT 54 (*) 0 - 35 U/L   Alkaline Phosphatase 68  39 - 117 U/L   Total Bilirubin 0.2 (*) 0.3 - 1.2 mg/dL   GFR calc non Af Amer 89 (*) >90 mL/min   GFR calc Af Amer >90  >90 mL/min  ETHANOL      Result Value Ref Range   Alcohol, Ethyl (B) <11  0 - 11 mg/dL  URINE RAPID DRUG SCREEN (HOSP PERFORMED)      Result Value Ref Range   Opiates NONE DETECTED  NONE DETECTED   Cocaine NONE DETECTED  NONE DETECTED   Benzodiazepines NONE DETECTED  NONE DETECTED   Amphetamines NONE DETECTED  NONE DETECTED   Tetrahydrocannabinol NONE DETECTED  NONE DETECTED   Barbiturates NONE DETECTED  NONE DETECTED    MDM   Final diagnoses:  Thought disorder    Disordered thought process. Patient is noted to be on lorazepam and venlafaxine. Psychiatric evaluation may be warranted and consultation will be obtained with TTS. However, I do  not see an obvious indication for acute care psychiatric admission. I suspect that she would best be managed by a referral to a psychiatrist for outpatient evaluation. TTS consult is requested.  Patient states that she will not stay for TTS consult and that she is going home to have her son commit her. She is advised that the process would only have to start over at the same place and she was advised to stay for TTS tonsil but she left AMA before I could give her any information or referrals.    Delora Fuel, MD [phone]

## 2013-09-26 ENCOUNTER — Encounter (INDEPENDENT_AMBULATORY_CARE_PROVIDER_SITE_OTHER): Payer: Self-pay

## 2014-10-13 ENCOUNTER — Ambulatory Visit (HOSPITAL_COMMUNITY)
Admission: RE | Admit: 2014-10-13 | Discharge: 2014-10-13 | Disposition: A | Discharge status: Home / Self Care | Payer: Medicare Other | Source: Home / Self Care | Attending: Psychiatry | Admitting: Psychiatry

## 2014-10-13 ENCOUNTER — Emergency Department (HOSPITAL_COMMUNITY)
Admission: EM | Admit: 2014-10-13 | Discharge: 2014-10-15 | Disposition: A | Discharge status: Home / Self Care | Payer: Medicare Other | Attending: Emergency Medicine | Admitting: Emergency Medicine

## 2014-10-13 ENCOUNTER — Encounter (HOSPITAL_COMMUNITY): Payer: Self-pay | Admitting: Emergency Medicine

## 2014-10-13 DIAGNOSIS — Z87448 Personal history of other diseases of urinary system: Secondary | ICD-10-CM | POA: Insufficient documentation

## 2014-10-13 DIAGNOSIS — I10 Essential (primary) hypertension: Secondary | ICD-10-CM | POA: Diagnosis not present

## 2014-10-13 DIAGNOSIS — Z79899 Other long term (current) drug therapy: Secondary | ICD-10-CM | POA: Insufficient documentation

## 2014-10-13 DIAGNOSIS — D649 Anemia, unspecified: Secondary | ICD-10-CM | POA: Diagnosis not present

## 2014-10-13 DIAGNOSIS — Z01818 Encounter for other preprocedural examination: Secondary | ICD-10-CM

## 2014-10-13 DIAGNOSIS — Z87891 Personal history of nicotine dependence: Secondary | ICD-10-CM | POA: Insufficient documentation

## 2014-10-13 DIAGNOSIS — F29 Unspecified psychosis not due to a substance or known physiological condition: Secondary | ICD-10-CM | POA: Diagnosis not present

## 2014-10-13 DIAGNOSIS — F410 Panic disorder [episodic paroxysmal anxiety] without agoraphobia: Secondary | ICD-10-CM | POA: Diagnosis not present

## 2014-10-13 DIAGNOSIS — M1990 Unspecified osteoarthritis, unspecified site: Secondary | ICD-10-CM | POA: Diagnosis not present

## 2014-10-13 DIAGNOSIS — R45851 Suicidal ideations: Secondary | ICD-10-CM

## 2014-10-13 DIAGNOSIS — F329 Major depressive disorder, single episode, unspecified: Secondary | ICD-10-CM | POA: Diagnosis not present

## 2014-10-13 DIAGNOSIS — E119 Type 2 diabetes mellitus without complications: Secondary | ICD-10-CM | POA: Diagnosis not present

## 2014-10-13 DIAGNOSIS — F1310 Sedative, hypnotic or anxiolytic abuse, uncomplicated: Secondary | ICD-10-CM | POA: Insufficient documentation

## 2014-10-13 DIAGNOSIS — F32A Depression, unspecified: Secondary | ICD-10-CM

## 2014-10-13 DIAGNOSIS — K219 Gastro-esophageal reflux disease without esophagitis: Secondary | ICD-10-CM | POA: Diagnosis not present

## 2014-10-13 DIAGNOSIS — E780 Pure hypercholesterolemia: Secondary | ICD-10-CM | POA: Diagnosis not present

## 2014-10-13 LAB — CBC
HCT: 39.3 % (ref 36.0–46.0)
Hemoglobin: 12.2 g/dL (ref 12.0–15.0)
MCH: 25.4 pg — ABNORMAL LOW (ref 26.0–34.0)
MCHC: 31 g/dL (ref 30.0–36.0)
MCV: 81.9 fL (ref 78.0–100.0)
PLATELETS: 242 10*3/uL (ref 150–400)
RBC: 4.8 MIL/uL (ref 3.87–5.11)
RDW: 15.7 % — AB (ref 11.5–15.5)
WBC: 7.6 10*3/uL (ref 4.0–10.5)

## 2014-10-13 LAB — COMPREHENSIVE METABOLIC PANEL
ALT: 33 U/L (ref 14–54)
ANION GAP: 12 (ref 5–15)
AST: 28 U/L (ref 15–41)
Albumin: 4.3 g/dL (ref 3.5–5.0)
Alkaline Phosphatase: 62 U/L (ref 38–126)
BUN: 29 mg/dL — AB (ref 6–20)
CO2: 22 mmol/L (ref 22–32)
Calcium: 9.6 mg/dL (ref 8.9–10.3)
Chloride: 106 mmol/L (ref 101–111)
Creatinine, Ser: 1.21 mg/dL — ABNORMAL HIGH (ref 0.44–1.00)
GFR, EST AFRICAN AMERICAN: 55 mL/min — AB (ref >60)
GFR, EST NON AFRICAN AMERICAN: 47 mL/min — AB (ref >60)
GLUCOSE: 159 mg/dL — AB (ref 65–99)
Potassium: 4.5 mmol/L (ref 3.5–5.1)
SODIUM: 140 mmol/L (ref 135–145)
TOTAL PROTEIN: 8.3 g/dL — AB (ref 6.5–8.1)
Total Bilirubin: 0.6 mg/dL (ref 0.3–1.2)

## 2014-10-13 LAB — ACETAMINOPHEN LEVEL: Acetaminophen (Tylenol), Serum: <10 ug/mL — ABNORMAL LOW (ref 10–30)

## 2014-10-13 LAB — ETHANOL: Alcohol, Ethyl (B): <5 mg/dL (ref <5)

## 2014-10-13 LAB — CBG MONITORING, ED: Glucose-Capillary: 140 mg/dL — ABNORMAL HIGH (ref 65–99)

## 2014-10-13 MED ORDER — NICOTINE 21 MG/24HR TD PT24
21.0000 mg | MEDICATED_PATCH | Freq: Every day | TRANSDERMAL | Status: DC
  Filled 2014-10-13 (×2): qty 1

## 2014-10-13 MED ORDER — ACETAMINOPHEN 325 MG PO TABS
650.0000 mg | ORAL_TABLET | ORAL | Status: DC | PRN
  Administered 2014-10-13: 650 mg via ORAL
  Filled 2014-10-13: qty 2

## 2014-10-13 MED ORDER — ZOLPIDEM TARTRATE 5 MG PO TABS
5.0000 mg | ORAL_TABLET | Freq: Every evening | ORAL | Status: DC | PRN
  Filled 2014-10-13: qty 1

## 2014-10-13 MED ORDER — IBUPROFEN 200 MG PO TABS: 600.0000 mg | ORAL_TABLET | Freq: Three times a day (TID) | ORAL | Status: DC | PRN

## 2014-10-13 MED ORDER — ALUM & MAG HYDROXIDE-SIMETH 200-200-20 MG/5ML PO SUSP
30.0000 mL | ORAL | Status: DC | PRN
  Administered 2014-10-14: 30 mL via ORAL
  Filled 2014-10-13: qty 30

## 2014-10-13 MED ORDER — LORAZEPAM 1 MG PO TABS
1.0000 mg | ORAL_TABLET | Freq: Three times a day (TID) | ORAL | Status: DC | PRN
  Administered 2014-10-13 – 2014-10-14 (×3): 1 mg via ORAL
  Filled 2014-10-13 (×3): qty 1

## 2014-10-13 MED ORDER — ONDANSETRON HCL 4 MG PO TABS
4.0000 mg | ORAL_TABLET | Freq: Three times a day (TID) | ORAL | Status: DC | PRN
  Administered 2014-10-13: 4 mg via ORAL
  Filled 2014-10-13: qty 1

## 2014-10-13 NOTE — ED Provider Notes (Signed)
CSN: [id number]     Arrival date & time 10/13/14  1649 History   First MD Initiated Contact with Patient 10/13/14 1819     Chief Complaint  Patient presents with  . Suicidal     (Consider location/radiation/quality/duration/timing/severity/associated sxs/prior Treatment) HPI..... Level V caveat for psychiatric disorder.  Patient evaluated at [facility] 3 times in the past several days for psychiatric issues. Each time she was discharged. She now states suicidal ideation with no plan and depression. Apparently she has not been taking her medications. She presents with her daughter-in-law.  Past Medical History  Diagnosis Date  . Diabetes mellitus without complication   . Panic attack   . Hypertension   . Hypercholesteremia   . GERD (gastroesophageal reflux disease)   . Hypothyroid   . Overactive bladder   . Arthritis   . Anemia   . Shortness of breath    Past Surgical History  Procedure Laterality Date  . Tubal ligation    . Eye surgery Left   . Colonoscopy with esophagogastroduodenoscopy (egd) N/A 05/20/2013    Procedure: COLONOSCOPY WITH ESOPHAGOGASTRODUODENOSCOPY (EGD);  Surgeon: Rogene Houston, MD;  Location: [facility];  Service: Endoscopy;  Laterality: N/A;  730  . Cholecystectomy N/A 06/02/2013    Procedure: LAPAROSCOPIC CHOLECYSTECTOMY;  Surgeon: Scherry Ran, MD;  Location: [facility];  Service: General;  Laterality: N/A;   No family history on file. History  Substance Use Topics  . Smoking status: Former Smoker -- 2.00 packs/day for 30 years    Types: Cigarettes    Quit date: 05/20/2002  . Smokeless tobacco: Not on file  . Alcohol Use: No   OB History    No data available     Review of Systems  Unable to perform ROS: Psychiatric disorder      Allergies  Review of patient's allergies indicates no known allergies.  Home Medications   Prior to Admission medications   Medication Sig Start Date End Date Taking? Authorizing Provider   amLODipine (NORVASC) 2.5 MG tablet Take 2.5 mg by mouth daily. 09/21/14  Yes Historical Provider, MD  Ascorbic Acid (VITAMIN C) 1000 MG tablet Take 1,000 mg by mouth daily.   Yes Historical Provider, MD  Cholecalciferol (VITAMIN D) 2000 UNITS CAPS Take 2,000 Units by mouth daily.    Yes Historical Provider, MD  docusate sodium 100 MG CAPS Take 100 mg by mouth 2 (two) times daily. Patient taking differently: Take 100 mg by mouth daily.  06/03/13  Yes Felicie Morn, MD  iron polysaccharides (NIFEREX) 150 MG capsule Take 150 mg by mouth daily.   Yes Historical Provider, MD  metFORMIN (GLUCOPHAGE) 500 MG tablet Take 500 mg by mouth 2 (two) times daily with a meal.    Yes Historical Provider, MD  naproxen (NAPROSYN) 375 MG tablet Take 375 mg by mouth 2 (two) times daily as needed for mild pain.  10/11/14  Yes Historical Provider, MD  omeprazole (PRILOSEC) 20 MG capsule Take 20 mg by mouth daily. 09/21/14  Yes Historical Provider, MD  oxybutynin (DITROPAN) 5 MG tablet Take 5 mg by mouth daily.   Yes Historical Provider, MD  pravastatin (PRAVACHOL) 40 MG tablet Take 40 mg by mouth daily.   Yes Historical Provider, MD  TRESIBA FLEXTOUCH 200 UNIT/ML SOPN Inject 10 Units into the skin daily. 09/22/14  Yes Historical Provider, MD  venlafaxine (EFFEXOR) 37.5 MG tablet Take 37.5 mg by mouth daily.   Yes Historical Provider, MD  vitamin B-12 (CYANOCOBALAMIN) 1000 MCG  tablet Take 1,000 mcg by mouth daily.   Yes Historical Provider, MD  oxyCODONE-acetaminophen (PERCOCET) 10-325 MG per tablet Take 1 tablet by mouth every 4 (four) hours as needed for pain. Patient not taking: Reported on 10/13/2014 06/03/13   Felicie Morn, MD   BP 177/89 mmHg  Pulse 95  Temp(Src) 98 F (36.7 C) (Oral)  Resp 17  SpO2 97% Physical Exam  Constitutional: She is oriented to person, place, and time. She appears well-developed and well-nourished.  HENT:  Head: Normocephalic and atraumatic.  Eyes: Conjunctivae and EOM are normal.  Pupils are equal, round, and reactive to light.  Neck: Normal range of motion. Neck supple.  Cardiovascular: Normal rate and regular rhythm.   Pulmonary/Chest: Effort normal and breath sounds normal.  Abdominal: Soft. Bowel sounds are normal.  Musculoskeletal: Normal range of motion.  Neurological: She is alert and oriented to person, place, and time.  Skin: Skin is warm and dry.  Psychiatric:  Flat affect, depressed  Nursing note and vitals reviewed.   ED Course  Procedures (including critical care time) Labs Review Labs Reviewed  CBC  COMPREHENSIVE METABOLIC PANEL  ETHANOL  ACETAMINOPHEN LEVEL  URINE RAPID DRUG SCREEN, HOSP PERFORMED  CBG MONITORING, ED    Imaging Review No results found.   EKG Interpretation None      MDM   Final diagnoses:  Suicidal ideation  Depression    Will obtain behavioral health consultation    Nat Christen, MD 10/13/14 [phone]

## 2014-10-13 NOTE — ED Notes (Signed)
Bed: [facility] Expected date:  Expected time:  Means of arrival:  Comments: Med. Clearance pt to TRIAGE

## 2014-10-13 NOTE — ED Notes (Signed)
Recollect blood due to blood mislabed earlier.  EDP Cook and main lab was notified immediately
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
## 2014-10-13 NOTE — ED Notes (Signed)
Per pt, states she has not been taking meds, was seen at [facility] for psyche issues-sent here for med adjustment-states if she does not get help she will kill herself

## 2014-10-14 ENCOUNTER — Emergency Department (HOSPITAL_COMMUNITY): Payer: Medicare Other

## 2014-10-14 DIAGNOSIS — R45851 Suicidal ideations: Secondary | ICD-10-CM

## 2014-10-14 DIAGNOSIS — F29 Unspecified psychosis not due to a substance or known physiological condition: Secondary | ICD-10-CM | POA: Diagnosis not present

## 2014-10-14 LAB — URINALYSIS, ROUTINE W REFLEX MICROSCOPIC
Glucose, UA: NEGATIVE mg/dL
Ketones, ur: 15 mg/dL — AB
LEUKOCYTES UA: NEGATIVE
NITRITE: NEGATIVE
Protein, ur: NEGATIVE mg/dL
Specific Gravity, Urine: 1.018 (ref 1.005–1.030)
UROBILINOGEN UA: 0.2 mg/dL (ref 0.0–1.0)
pH: 5.5 (ref 5.0–8.0)

## 2014-10-14 LAB — CBG MONITORING, ED
GLUCOSE-CAPILLARY: 133 mg/dL — AB (ref 65–99)
GLUCOSE-CAPILLARY: 124 mg/dL — AB (ref 65–99)
Glucose-Capillary: 164 mg/dL — ABNORMAL HIGH (ref 65–99)

## 2014-10-14 LAB — URINE MICROSCOPIC-ADD ON

## 2014-10-14 LAB — RAPID URINE DRUG SCREEN, HOSP PERFORMED
Amphetamines: NOT DETECTED
BENZODIAZEPINES: POSITIVE — AB
Barbiturates: NOT DETECTED
Cocaine: NOT DETECTED
Opiates: NOT DETECTED
TETRAHYDROCANNABINOL: NOT DETECTED

## 2014-10-14 MED ORDER — RISPERIDONE 0.5 MG PO TBDP
0.5000 mg | ORAL_TABLET | Freq: Every day | ORAL | Status: DC
  Administered 2014-10-14: 0.5 mg via ORAL
  Filled 2014-10-14 (×2): qty 1

## 2014-10-14 MED ORDER — INSULIN ASPART 100 UNIT/ML ~~LOC~~ SOLN
0.0000 [IU] | Freq: Three times a day (TID) | SUBCUTANEOUS | Status: DC
  Administered 2014-10-14 (×2): 2 [IU] via SUBCUTANEOUS
  Administered 2014-10-14 – 2014-10-15 (×3): 3 [IU] via SUBCUTANEOUS
  Filled 2014-10-14 (×5): qty 1

## 2014-10-14 MED ORDER — INSULIN GLARGINE 100 UNIT/ML ~~LOC~~ SOLN
10.0000 [IU] | Freq: Every day | SUBCUTANEOUS | Status: DC
  Administered 2014-10-14 – 2014-10-15 (×2): 10 [IU] via SUBCUTANEOUS
  Filled 2014-10-14 (×2): qty 0.1

## 2014-10-14 MED ORDER — PANTOPRAZOLE SODIUM 40 MG PO TBEC
40.0000 mg | DELAYED_RELEASE_TABLET | Freq: Every day | ORAL | Status: DC
  Administered 2014-10-14 – 2014-10-15 (×2): 40 mg via ORAL
  Filled 2014-10-14 (×2): qty 1

## 2014-10-14 MED ORDER — OXYBUTYNIN CHLORIDE 5 MG PO TABS
5.0000 mg | ORAL_TABLET | Freq: Every day | ORAL | Status: DC
  Administered 2014-10-14 – 2014-10-15 (×2): 5 mg via ORAL
  Filled 2014-10-14 (×2): qty 1

## 2014-10-14 MED ORDER — AMLODIPINE BESYLATE 2.5 MG PO TABS
2.5000 mg | ORAL_TABLET | Freq: Every day | ORAL | Status: DC
  Administered 2014-10-14 – 2014-10-15 (×2): 2.5 mg via ORAL
  Filled 2014-10-14 (×2): qty 1

## 2014-10-14 MED ORDER — PRAVASTATIN SODIUM 40 MG PO TABS
40.0000 mg | ORAL_TABLET | Freq: Every day | ORAL | Status: DC
  Administered 2014-10-14 – 2014-10-15 (×2): 40 mg via ORAL
  Filled 2014-10-14 (×2): qty 1

## 2014-10-14 MED ORDER — INSULIN DEGLUDEC 200 UNIT/ML ~~LOC~~ SOPN: 10.0000 [IU] | PEN_INJECTOR | Freq: Every day | SUBCUTANEOUS | Status: DC

## 2014-10-14 MED ORDER — METFORMIN HCL 500 MG PO TABS
500.0000 mg | ORAL_TABLET | Freq: Two times a day (BID) | ORAL | Status: DC
  Administered 2014-10-14 – 2014-10-15 (×4): 500 mg via ORAL
  Filled 2014-10-14 (×5): qty 1

## 2014-10-14 MED ORDER — VENLAFAXINE HCL ER 75 MG PO CP24
75.0000 mg | ORAL_CAPSULE | Freq: Every day | ORAL | Status: DC
  Administered 2014-10-14 – 2014-10-15 (×2): 75 mg via ORAL
  Filled 2014-10-14 (×3): qty 1

## 2014-10-14 NOTE — ED Notes (Signed)
Pt alert x4, resting watching TV, sitter at the bedside, will continue to monitor. Jeanie Sewer, RN 8:08 PM 10/14/2014

## 2014-10-14 NOTE — ED Notes (Signed)
Patient asking over and over about her placement at facility in [place].  Reassured patient we would let her know when we hear something.

## 2014-10-14 NOTE — ED Notes (Signed)
Patient continues to sit in chair conversing with sitter pleasantly.

## 2014-10-14 NOTE — Progress Notes (Signed)
Disposition CSW completed referrals to the following inpatient geri-psych facilties:  [facility] will continue to assist with patient's placement needs.  [place] Disposition CSW [phone]

## 2014-10-14 NOTE — Consult Note (Signed)
[facility] Consult   Reason for Consult:  Psychosis Referring Physician:  EDP Patient Identification: Erika Jacobs MRN:  [id number] Principal Diagnosis: Psychosis Diagnosis:   Patient Active Problem List   Diagnosis Date Noted  . Psychosis [F29] 10/14/2014    Priority: High  . Cholecystitis with cholelithiasis [K80.10] 06/02/2013    Total Time spent with patient: 1 hour  Subjective:   Erika Jacobs is a 64 y.o. female patient admitted with Psychosis.  HPI:  Caucasian female, 64 years old was evaluated for Paranoia.  Patient was brought in by her family for feeling paranoid stating that people are out to kill her.  Today patient stated that she lives in an environment with "crazy and crooked people"  She admitted to seeing Dr Reece Levy for her [facility] care.  Patient reports that she is being treated for anxiety  And she takes Effexor.  Patient also stated that her fear of being killed by people keeps her feeling suicidal.  Patient was taken to [facility] yesterday for similar symptoms and was sent home after evaluation.  Patient denied previous Psychotic break but there is a hx of Schizophrenia in her family.  Patient repeatedly stated that she does not feel safe at home.  Patient denies HI/AVH.  She has been accepted for admission and we will be seeking placement at any facility with available bed.  HPI Elements:   Location:  Psychosis, Paranoia, Anxiety disorder. Quality:  severe, afraid people are out to kill her.. Severity:  severe. Timing:  acute. Duration:  Chronic anxiety disorder, sudden onset of Psychosis. Context:  Brought in by family for Paranoia..  Past Medical History:  Past Medical History  Diagnosis Date  . Diabetes mellitus without complication   . Panic attack   . Hypertension   . Hypercholesteremia   . GERD (gastroesophageal reflux disease)   . Hypothyroid   . Overactive bladder   . Arthritis   . Anemia   . Shortness of breath      Past Surgical History  Procedure Laterality Date  . Tubal ligation    . Eye surgery Left   . Colonoscopy with esophagogastroduodenoscopy (egd) N/A 05/20/2013    Procedure: COLONOSCOPY WITH ESOPHAGOGASTRODUODENOSCOPY (EGD);  Surgeon: Rogene Houston, MD;  Location: [facility]ITE;  Service: Endoscopy;  Laterality: N/A;  730  . Cholecystectomy N/A 06/02/2013    Procedure: LAPAROSCOPIC CHOLECYSTECTOMY;  Surgeon: Scherry Ran, MD;  Location: [facility] ORS;  Service: General;  Laterality: N/A;   Family History: No family history on file. Social History:  History  Alcohol Use No     History  Drug Use No    History   Social History  . Marital Status: Divorced    Spouse Name: N/A  . Number of Children: N/A  . Years of Education: N/A   Social History Main Topics  . Smoking status: Former Smoker -- 2.00 packs/day for 30 years    Types: Cigarettes    Quit date: 05/20/2002  . Smokeless tobacco: Not on file  . Alcohol Use: No  . Drug Use: No  . Sexual Activity: Yes    Birth Control/ Protection: Post-menopausal   Other Topics Concern  . None   Social History Narrative   Additional Social History:   Allergies:  No Known Allergies  Labs:  Results for orders placed or performed during the hospital encounter of 10/13/14 (from the past 48 hour(s))  CBG monitoring, ED     Status: Abnormal   Collection  Time: 10/13/14  7:32 PM  Result Value Ref Range   Glucose-Capillary 140 (H) 65 - 99 mg/dL  CBC     Status: Abnormal   Collection Time: 10/13/14  8:03 PM  Result Value Ref Range   WBC 7.6 4.0 - 10.5 K/uL   RBC 4.80 3.87 - 5.11 MIL/uL   Hemoglobin 12.2 12.0 - 15.0 g/dL   HCT 39.3 36.0 - 46.0 %   MCV 81.9 78.0 - 100.0 fL   MCH 25.4 (L) 26.0 - 34.0 pg   MCHC 31.0 30.0 - 36.0 g/dL   RDW 15.7 (H) 11.5 - 15.5 %   Platelets 242 150 - 400 K/uL  Comprehensive metabolic panel     Status: Abnormal   Collection Time: 10/13/14  8:03 PM  Result Value Ref Range   Sodium 140 135 - 145 mmol/L    Potassium 4.5 3.5 - 5.1 mmol/L   Chloride 106 101 - 111 mmol/L   CO2 22 22 - 32 mmol/L   Glucose, Bld 159 (H) 65 - 99 mg/dL   BUN 29 (H) 6 - 20 mg/dL   Creatinine, Ser 1.21 (H) 0.44 - 1.00 mg/dL   Calcium 9.6 8.9 - 10.3 mg/dL   Total Protein 8.3 (H) 6.5 - 8.1 g/dL   Albumin 4.3 3.5 - 5.0 g/dL   AST 28 15 - 41 U/L   ALT 33 14 - 54 U/L   Alkaline Phosphatase 62 38 - 126 U/L   Total Bilirubin 0.6 0.3 - 1.2 mg/dL   GFR calc non Af Amer 47 (L) >60 mL/min   GFR calc Af Amer 55 (L) >60 mL/min    Comment: (NOTE) The eGFR has been calculated using the CKD EPI equation. This calculation has not been validated in all clinical situations. eGFR's persistently <60 mL/min signify possible Chronic Kidney Disease.    Anion gap 12 5 - 15  Ethanol (ETOH)     Status: None   Collection Time: 10/13/14  8:03 PM  Result Value Ref Range   Alcohol, Ethyl (B) <5 <5 mg/dL    Comment:        LOWEST DETECTABLE LIMIT FOR SERUM ALCOHOL IS 5 mg/dL FOR MEDICAL PURPOSES ONLY   Acetaminophen level     Status: Abnormal   Collection Time: 10/13/14  8:03 PM  Result Value Ref Range   Acetaminophen (Tylenol), Serum <10 (L) 10 - 30 ug/mL    Comment:        THERAPEUTIC CONCENTRATIONS VARY SIGNIFICANTLY. A RANGE OF 10-30 ug/mL MAY BE AN EFFECTIVE CONCENTRATION FOR MANY PATIENTS. HOWEVER, SOME ARE BEST TREATED AT CONCENTRATIONS OUTSIDE THIS RANGE. ACETAMINOPHEN CONCENTRATIONS >150 ug/mL AT 4 HOURS AFTER INGESTION AND >50 ug/mL AT 12 HOURS AFTER INGESTION ARE OFTEN ASSOCIATED WITH TOXIC REACTIONS.   CBG monitoring, ED     Status: Abnormal   Collection Time: 10/14/14  7:43 AM  Result Value Ref Range   Glucose-Capillary 133 (H) 65 - 99 mg/dL   Comment 1 Notify RN    Comment 2 Document in Chart   Urine rapid drug screen (hosp performed)not at [facility]     Status: Abnormal   Collection Time: 10/14/14  8:48 AM  Result Value Ref Range   Opiates NONE DETECTED NONE DETECTED   Cocaine NONE DETECTED NONE DETECTED    Benzodiazepines POSITIVE (A) NONE DETECTED   Amphetamines NONE DETECTED NONE DETECTED   Tetrahydrocannabinol NONE DETECTED NONE DETECTED   Barbiturates NONE DETECTED NONE DETECTED    Comment:  DRUG SCREEN FOR MEDICAL PURPOSES ONLY.  IF CONFIRMATION IS NEEDED FOR ANY PURPOSE, NOTIFY LAB WITHIN 5 DAYS.        LOWEST DETECTABLE LIMITS FOR URINE DRUG SCREEN Drug Class       Cutoff (ng/mL) Amphetamine      1000 Barbiturate      200 Benzodiazepine   993 Tricyclics       716 Opiates          300 Cocaine          300 THC              50   Urinalysis, Routine w reflex microscopic (not at [facility])     Status: Abnormal   Collection Time: 10/14/14  9:00 AM  Result Value Ref Range   Color, Urine YELLOW YELLOW   APPearance CLEAR (A) CLEAR   Specific Gravity, Urine 1.018 1.005 - 1.030   pH 5.5 5.0 - 8.0   Glucose, UA NEGATIVE NEGATIVE mg/dL   Hgb urine dipstick SMALL (A) NEGATIVE   Bilirubin Urine SMALL (A) NEGATIVE   Ketones, ur 15 (A) NEGATIVE mg/dL   Protein, ur NEGATIVE NEGATIVE mg/dL   Urobilinogen, UA 0.2 0.0 - 1.0 mg/dL   Nitrite NEGATIVE NEGATIVE   Leukocytes, UA NEGATIVE NEGATIVE  Urine microscopic-add on     Status: Abnormal   Collection Time: 10/14/14  9:00 AM  Result Value Ref Range   Squamous Epithelial / LPF FEW (A) RARE   RBC / HPF 0-2 <3 RBC/hpf   Bacteria, UA FEW (A) RARE   Crystals URIC ACID CRYSTALS (A) NEGATIVE  CBG monitoring, ED     Status: Abnormal   Collection Time: 10/14/14 12:18 PM  Result Value Ref Range   Glucose-Capillary 164 (H) 65 - 99 mg/dL  CBG monitoring, ED     Status: Abnormal   Collection Time: 10/14/14  5:10 PM  Result Value Ref Range   Glucose-Capillary 124 (H) 65 - 99 mg/dL    Vitals: Blood pressure 132/63, pulse 77, temperature 98.2 F (36.8 C), temperature source Oral, resp. rate 18, SpO2 100 %.  Risk to Self: Is patient at risk for suicide?: No, but patient needs Medical Clearance Risk to Others:   Prior Inpatient Therapy:    Prior Outpatient Therapy:    Current Facility-Administered Medications  Medication Dose Route Frequency Provider Last Rate Last Dose  . acetaminophen (TYLENOL) tablet 650 mg  650 mg Oral Q4H PRN Nat Christen, MD   650 mg at 10/13/14 2244  . alum & mag hydroxide-simeth (MAALOX/MYLANTA) 200-200-20 MG/5ML suspension 30 mL  30 mL Oral PRN Nat Christen, MD   30 mL at 10/14/14 0806  . amLODipine (NORVASC) tablet 2.5 mg  2.5 mg Oral Daily Carmin Muskrat, MD   2.5 mg at 10/14/14 0940  . ibuprofen (ADVIL,MOTRIN) tablet 600 mg  600 mg Oral Q8H PRN Nat Christen, MD      . insulin aspart (novoLOG) injection 0-15 Units  0-15 Units Subcutaneous TID WC Nat Christen, MD   2 Units at 10/14/14 1744  . insulin glargine (LANTUS) injection 10 Units  10 Units Subcutaneous Daily Carmin Muskrat, MD   10 Units at 10/14/14 [phone]  . LORazepam (ATIVAN) tablet 1 mg  1 mg Oral Q8H PRN Nat Christen, MD   1 mg at 10/14/14 1241  . metFORMIN (GLUCOPHAGE) tablet 500 mg  500 mg Oral BID WC Carmin Muskrat, MD   500 mg at 10/14/14 0940  . nicotine (NICODERM CQ - dosed in  mg/24 hours) patch 21 mg  21 mg Transdermal Daily Nat Christen, MD   21 mg at 10/13/14 2206  . ondansetron (ZOFRAN) tablet 4 mg  4 mg Oral Q8H PRN Nat Christen, MD   4 mg at 10/13/14 2223  . oxybutynin (DITROPAN) tablet 5 mg  5 mg Oral Daily Carmin Muskrat, MD   5 mg at 10/14/14 0941  . pantoprazole (PROTONIX) EC tablet 40 mg  40 mg Oral Daily Carmin Muskrat, MD   40 mg at 10/14/14 0943  . pravastatin (PRAVACHOL) tablet 40 mg  40 mg Oral Daily Carmin Muskrat, MD   40 mg at 10/14/14 0940  . venlafaxine XR (EFFEXOR-XR) 24 hr capsule 75 mg  75 mg Oral Q breakfast Carmin Muskrat, MD   75 mg at 10/14/14 0940  . zolpidem (AMBIEN) tablet 5 mg  5 mg Oral QHS PRN Nat Christen, MD       Current Outpatient Prescriptions  Medication Sig Dispense Refill  . amLODipine (NORVASC) 2.5 MG tablet Take 2.5 mg by mouth daily.    . Ascorbic Acid (VITAMIN C) 1000 MG tablet Take 1,000 mg by  mouth daily.    . Cholecalciferol (VITAMIN D) 2000 UNITS CAPS Take 2,000 Units by mouth daily.     Marland Kitchen docusate sodium 100 MG CAPS Take 100 mg by mouth 2 (two) times daily. (Patient taking differently: Take 100 mg by mouth daily. ) 10 capsule 0  . iron polysaccharides (NIFEREX) 150 MG capsule Take 150 mg by mouth daily.    . metFORMIN (GLUCOPHAGE) 500 MG tablet Take 500 mg by mouth 2 (two) times daily with a meal.     . naproxen (NAPROSYN) 375 MG tablet Take 375 mg by mouth 2 (two) times daily as needed for mild pain.     Marland Kitchen omeprazole (PRILOSEC) 20 MG capsule Take 20 mg by mouth daily.    Marland Kitchen oxybutynin (DITROPAN) 5 MG tablet Take 5 mg by mouth daily.    . pravastatin (PRAVACHOL) 40 MG tablet Take 40 mg by mouth daily.    . TRESIBA FLEXTOUCH 200 UNIT/ML SOPN Inject 10 Units into the skin daily.    Marland Kitchen venlafaxine (EFFEXOR) 37.5 MG tablet Take 37.5 mg by mouth daily.    . vitamin B-12 (CYANOCOBALAMIN) 1000 MCG tablet Take 1,000 mcg by mouth daily.    Marland Kitchen oxyCODONE-acetaminophen (PERCOCET) 10-325 MG per tablet Take 1 tablet by mouth every 4 (four) hours as needed for pain. (Patient not taking: Reported on 10/13/2014) 30 tablet 0    Musculoskeletal: Strength & Muscle Tone: was seen in bed lying down Gait & Station: was seen in bed lying down Patient leans: see above  Psychiatric Specialty Exam: Physical Exam  Review of Systems  Constitutional: Negative.   HENT: Negative.   Eyes: Negative.   Respiratory: Negative.   Cardiovascular: Negative.   Gastrointestinal: Negative.   Genitourinary: Negative.   Musculoskeletal: Negative.   Skin: Negative.   Neurological: Negative.   Endo/Heme/Allergies: Negative.     Blood pressure 132/63, pulse 77, temperature 98.2 F (36.8 C), temperature source Oral, resp. rate 18, SpO2 100 %.There is no weight on file to calculate BMI.  General Appearance: Casual  Eye Contact::  Good  Speech:  Clear and Coherent and Normal Rate  Volume:  Normal  Mood:  Anxious,  Hopeless and helpless  Affect:  Congruent and Tearful  Thought Process:  Coherent  Orientation:  Full (Time, Place, and Person)  Thought Content:  Paranoid Ideation  Suicidal Thoughts:  Yes.  without intent/plan  Homicidal Thoughts:  No  Memory:  Immediate;   Good Recent;   Fair Remote;   Fair  Judgement:  Impaired  Insight:  Shallow  Psychomotor Activity:  Psychomotor Retardation  Concentration:  Fair  Recall:  [place] of Knowledge:Fair  Language: Good  Akathisia:  NA  Handed:  Right  AIMS (if indicated):     Assets:  Desire for Improvement  ADL's:  Intact  Cognition: WNL  Sleep:      Medical Decision Making: Review of Psycho-Social Stressors (1)  Treatment Plan Summary: Daily contact with patient to assess and evaluate symptoms and progress in treatment and Medication management  Plan:  Resume all home medications.   Start Risperdal 0.5 mg po qhs for mood control.   We will use Ativan 1 mg po every 8 hours as needed for anxiety.   Obtain EKG and Chest Xray for pre gero-pschiatric admission.  Disposition: Admit and seek placement  Delfin Gant   PMHNP-BC 10/14/2014 6:01 PM  Patient seen face-to-face for this psychiatric evaluation and case discussed with physician extender, treatment team and made appropriate treatment plan. Reviewed the information documented and agree with the treatment plan.  Coreen Shippee,JANARDHAHA R. 10/15/2014 2:07 PM

## 2014-10-14 NOTE — ED Notes (Signed)
Pt alert x4, resting sitter at the bedside will continue to monitor. Jeanie Sewer, RN 12:10 AM 10/14/2014

## 2014-10-14 NOTE — ED Notes (Signed)
Patient asking what kind of medicine was it that nurse gave her.  "That medicine made me feel so much better."

## 2014-10-14 NOTE — ED Notes (Signed)
Patient sitting up in chair eating lunch asking for ice cream and diabetic jello.  Patient had fruit on tray.  Told patient we would order her some diabetic jello for dinner.  Patient enjoying her lunch.

## 2014-10-14 NOTE — ED Notes (Signed)
Patient not receiving diabetic trays.  Ordered lunch tray as diabetic tray.

## 2014-10-14 NOTE — ED Notes (Signed)
Patient wants to go to [facility] to be closer to her brothers family.  Notified Education officer, museum about patient's preference.

## 2014-10-15 DIAGNOSIS — F29 Unspecified psychosis not due to a substance or known physiological condition: Secondary | ICD-10-CM | POA: Diagnosis not present

## 2014-10-15 LAB — CBG MONITORING, ED
GLUCOSE-CAPILLARY: 160 mg/dL — AB (ref 65–99)
Glucose-Capillary: 199 mg/dL — ABNORMAL HIGH (ref 65–99)

## 2014-10-15 MED ORDER — RISPERIDONE 0.5 MG PO TBDP: 0.5000 mg | ORAL_TABLET | Freq: Every day | ORAL | Status: DC

## 2014-10-15 MED ORDER — VENLAFAXINE HCL ER 75 MG PO CP24: 75.0000 mg | ORAL_CAPSULE | Freq: Every day | ORAL | Status: DC

## 2014-10-15 NOTE — Progress Notes (Signed)
Pt discharged to care of her son.

## 2014-10-15 NOTE — Consult Note (Signed)
[facility] Consult   Reason for Consult:  Psychosis Referring Physician:  EDP Patient Identification: Erika Jacobs MRN:  [id number] Principal Diagnosis: Psychosis Diagnosis:   Patient Active Problem List   Diagnosis Date Noted  . Psychosis [F29] 10/14/2014    Priority: High  . Depression [F32.9]   . Suicidal ideation [R45.851]   . Cholecystitis with cholelithiasis [K80.10] 06/02/2013    Total Time spent with patient: 1 hour  Subjective:   Erika Jacobs is a 64 y.o. female patient admitted with Psychosis.  HPI:  Caucasian female, 64 years old was evaluated for Paranoia.  Patient was brought in by her family for feeling paranoid stating that people are out to kill her.  Today patient stated that she lives in an environment with "crazy and crooked people"  She admitted to seeing Dr Reece Levy for her [facility] care.  Patient reports that she is being treated for anxiety  And she takes Effexor.  Patient also stated that her fear of being killed by people keeps her feeling suicidal.  Patient was taken to [facility] yesterday for similar symptoms and was sent home after evaluation.  Patient denied previous Psychotic break but there is a hx of Schizophrenia in her family.  Patient repeatedly stated that she does not feel safe at home.  Patient denies HI/AVH.  She has been accepted for admission and we will be seeking placement at any facility with available bed.  Patient was seen today much calmer and less anxious.  Patient reports that Risperdal eased her Paranoia.  She stated that she has been having Paranoid ideations for most of her adult life and states that she had refused medications in the past.  Patient states she will continue taking her low dose Risperdal and plans to start seeing a Psychiatrist.  Information was given for outpatient Psychiatric provider in the community.  She denies SI/HI/AVH and she is discharged home.  HPI Elements:   Location:  Psychosis,  Paranoia, Anxiety disorder. Quality:  severe, afraid people are out to kill her.. Severity:  severe. Timing:  acute. Duration:  Chronic anxiety disorder, sudden onset of Psychosis. Context:  Brought in by family for Paranoia..  Past Medical History:  Past Medical History  Diagnosis Date  . Diabetes mellitus without complication   . Panic attack   . Hypertension   . Hypercholesteremia   . GERD (gastroesophageal reflux disease)   . Hypothyroid   . Overactive bladder   . Arthritis   . Anemia   . Shortness of breath     Past Surgical History  Procedure Laterality Date  . Tubal ligation    . Eye surgery Left   . Colonoscopy with esophagogastroduodenoscopy (egd) N/A 05/20/2013    Procedure: COLONOSCOPY WITH ESOPHAGOGASTRODUODENOSCOPY (EGD);  Surgeon: Rogene Houston, MD;  Location: [facility] ENDO SUITE;  Service: Endoscopy;  Laterality: N/A;  730  . Cholecystectomy N/A 06/02/2013    Procedure: LAPAROSCOPIC CHOLECYSTECTOMY;  Surgeon: Scherry Ran, MD;  Location: [facility] ORS;  Service: General;  Laterality: N/A;   Family History: No family history on file. Social History:  History  Alcohol Use No     History  Drug Use No    History   Social History  . Marital Status: Divorced    Spouse Name: N/A  . Number of Children: N/A  . Years of Education: N/A   Social History Main Topics  . Smoking status: Former Smoker -- 2.00 packs/day for 30 years    Types: Cigarettes  Quit date: 05/20/2002  . Smokeless tobacco: Not on file  . Alcohol Use: No  . Drug Use: No  . Sexual Activity: Yes    Birth Control/ Protection: Post-menopausal   Other Topics Concern  . None   Social History Narrative   Additional Social History:   Allergies:  No Known Allergies  Labs:  Results for orders placed or performed during the hospital encounter of 10/13/14 (from the past 48 hour(s))  CBG monitoring, ED     Status: Abnormal   Collection Time: 10/13/14  7:32 PM  Result Value Ref Range    Glucose-Capillary 140 (H) 65 - 99 mg/dL  CBC     Status: Abnormal   Collection Time: 10/13/14  8:03 PM  Result Value Ref Range   WBC 7.6 4.0 - 10.5 K/uL   RBC 4.80 3.87 - 5.11 MIL/uL   Hemoglobin 12.2 12.0 - 15.0 g/dL   HCT 39.3 36.0 - 46.0 %   MCV 81.9 78.0 - 100.0 fL   MCH 25.4 (L) 26.0 - 34.0 pg   MCHC 31.0 30.0 - 36.0 g/dL   RDW 15.7 (H) 11.5 - 15.5 %   Platelets 242 150 - 400 K/uL  Comprehensive metabolic panel     Status: Abnormal   Collection Time: 10/13/14  8:03 PM  Result Value Ref Range   Sodium 140 135 - 145 mmol/L   Potassium 4.5 3.5 - 5.1 mmol/L   Chloride 106 101 - 111 mmol/L   CO2 22 22 - 32 mmol/L   Glucose, Bld 159 (H) 65 - 99 mg/dL   BUN 29 (H) 6 - 20 mg/dL   Creatinine, Ser 1.21 (H) 0.44 - 1.00 mg/dL   Calcium 9.6 8.9 - 10.3 mg/dL   Total Protein 8.3 (H) 6.5 - 8.1 g/dL   Albumin 4.3 3.5 - 5.0 g/dL   AST 28 15 - 41 U/L   ALT 33 14 - 54 U/L   Alkaline Phosphatase 62 38 - 126 U/L   Total Bilirubin 0.6 0.3 - 1.2 mg/dL   GFR calc non Af Amer 47 (L) >60 mL/min   GFR calc Af Amer 55 (L) >60 mL/min    Comment: (NOTE) The eGFR has been calculated using the CKD EPI equation. This calculation has not been validated in all clinical situations. eGFR's persistently <60 mL/min signify possible Chronic Kidney Disease.    Anion gap 12 5 - 15  Ethanol (ETOH)     Status: None   Collection Time: 10/13/14  8:03 PM  Result Value Ref Range   Alcohol, Ethyl (B) <5 <5 mg/dL    Comment:        LOWEST DETECTABLE LIMIT FOR SERUM ALCOHOL IS 5 mg/dL FOR MEDICAL PURPOSES ONLY   Acetaminophen level     Status: Abnormal   Collection Time: 10/13/14  8:03 PM  Result Value Ref Range   Acetaminophen (Tylenol), Serum <10 (L) 10 - 30 ug/mL    Comment:        THERAPEUTIC CONCENTRATIONS VARY SIGNIFICANTLY. A RANGE OF 10-30 ug/mL MAY BE AN EFFECTIVE CONCENTRATION FOR MANY PATIENTS. HOWEVER, SOME ARE BEST TREATED AT CONCENTRATIONS OUTSIDE THIS RANGE. ACETAMINOPHEN  CONCENTRATIONS >150 ug/mL AT 4 HOURS AFTER INGESTION AND >50 ug/mL AT 12 HOURS AFTER INGESTION ARE OFTEN ASSOCIATED WITH TOXIC REACTIONS.   CBG monitoring, ED     Status: Abnormal   Collection Time: 10/14/14  7:43 AM  Result Value Ref Range   Glucose-Capillary 133 (H) 65 - 99 mg/dL   Comment 1  Notify RN    Comment 2 Document in Chart   Urine rapid drug screen (hosp performed)not at [facility]     Status: Abnormal   Collection Time: 10/14/14  8:48 AM  Result Value Ref Range   Opiates NONE DETECTED NONE DETECTED   Cocaine NONE DETECTED NONE DETECTED   Benzodiazepines POSITIVE (A) NONE DETECTED   Amphetamines NONE DETECTED NONE DETECTED   Tetrahydrocannabinol NONE DETECTED NONE DETECTED   Barbiturates NONE DETECTED NONE DETECTED    Comment:        DRUG SCREEN FOR MEDICAL PURPOSES ONLY.  IF CONFIRMATION IS NEEDED FOR ANY PURPOSE, NOTIFY LAB WITHIN 5 DAYS.        LOWEST DETECTABLE LIMITS FOR URINE DRUG SCREEN Drug Class       Cutoff (ng/mL) Amphetamine      1000 Barbiturate      200 Benzodiazepine   222 Tricyclics       979 Opiates          300 Cocaine          300 THC              50   Urinalysis, Routine w reflex microscopic (not at [facility])     Status: Abnormal   Collection Time: 10/14/14  9:00 AM  Result Value Ref Range   Color, Urine YELLOW YELLOW   APPearance CLEAR (A) CLEAR   Specific Gravity, Urine 1.018 1.005 - 1.030   pH 5.5 5.0 - 8.0   Glucose, UA NEGATIVE NEGATIVE mg/dL   Hgb urine dipstick SMALL (A) NEGATIVE   Bilirubin Urine SMALL (A) NEGATIVE   Ketones, ur 15 (A) NEGATIVE mg/dL   Protein, ur NEGATIVE NEGATIVE mg/dL   Urobilinogen, UA 0.2 0.0 - 1.0 mg/dL   Nitrite NEGATIVE NEGATIVE   Leukocytes, UA NEGATIVE NEGATIVE  Urine microscopic-add on     Status: Abnormal   Collection Time: 10/14/14  9:00 AM  Result Value Ref Range   Squamous Epithelial / LPF FEW (A) RARE   RBC / HPF 0-2 <3 RBC/hpf   Bacteria, UA FEW (A) RARE   Crystals URIC ACID CRYSTALS (A)  NEGATIVE  CBG monitoring, ED     Status: Abnormal   Collection Time: 10/14/14 12:18 PM  Result Value Ref Range   Glucose-Capillary 164 (H) 65 - 99 mg/dL  CBG monitoring, ED     Status: Abnormal   Collection Time: 10/14/14  5:10 PM  Result Value Ref Range   Glucose-Capillary 124 (H) 65 - 99 mg/dL  CBG monitoring, ED     Status: Abnormal   Collection Time: 10/15/14  8:07 AM  Result Value Ref Range   Glucose-Capillary 160 (H) 65 - 99 mg/dL  CBG monitoring, ED     Status: Abnormal   Collection Time: 10/15/14 11:19 AM  Result Value Ref Range   Glucose-Capillary 199 (H) 65 - 99 mg/dL    Vitals: Blood pressure 119/59, pulse 91, temperature 97.8 F (36.6 C), temperature source Oral, resp. rate 16, SpO2 99 %.  Risk to Self: Is patient at risk for suicide?: No, but patient needs Medical Clearance Risk to Others:   Prior Inpatient Therapy:   Prior Outpatient Therapy:    Current Facility-Administered Medications  Medication Dose Route Frequency Provider Last Rate Last Dose  . acetaminophen (TYLENOL) tablet 650 mg  650 mg Oral Q4H PRN Nat Christen, MD   650 mg at 10/13/14 2244  . alum & mag hydroxide-simeth (MAALOX/MYLANTA) 200-200-20 MG/5ML suspension 30 mL  30 mL  Oral PRN Nat Christen, MD   30 mL at 10/14/14 0806  . amLODipine (NORVASC) tablet 2.5 mg  2.5 mg Oral Daily Carmin Muskrat, MD   2.5 mg at 10/15/14 0846  . ibuprofen (ADVIL,MOTRIN) tablet 600 mg  600 mg Oral Q8H PRN Nat Christen, MD      . insulin aspart (novoLOG) injection 0-15 Units  0-15 Units Subcutaneous TID WC Nat Christen, MD   3 Units at 10/15/14 1148  . insulin glargine (LANTUS) injection 10 Units  10 Units Subcutaneous Daily Carmin Muskrat, MD   10 Units at 10/15/14 1157  . LORazepam (ATIVAN) tablet 1 mg  1 mg Oral Q8H PRN Nat Christen, MD   1 mg at 10/14/14 2150  . metFORMIN (GLUCOPHAGE) tablet 500 mg  500 mg Oral BID WC Carmin Muskrat, MD   500 mg at 10/15/14 0841  . nicotine (NICODERM CQ - dosed in mg/24 hours) patch 21 mg   21 mg Transdermal Daily Nat Christen, MD   21 mg at 10/13/14 2206  . ondansetron (ZOFRAN) tablet 4 mg  4 mg Oral Q8H PRN Nat Christen, MD   4 mg at 10/13/14 2223  . oxybutynin (DITROPAN) tablet 5 mg  5 mg Oral Daily Carmin Muskrat, MD   5 mg at 10/15/14 0841  . pantoprazole (PROTONIX) EC tablet 40 mg  40 mg Oral Daily Carmin Muskrat, MD   40 mg at 10/15/14 0813  . pravastatin (PRAVACHOL) tablet 40 mg  40 mg Oral Daily Carmin Muskrat, MD   40 mg at 10/15/14 0841  . risperiDONE (RISPERDAL M-TABS) disintegrating tablet 0.5 mg  0.5 mg Oral QHS Delfin Gant, NP   0.5 mg at 10/14/14 2150  . venlafaxine XR (EFFEXOR-XR) 24 hr capsule 75 mg  75 mg Oral Q breakfast Carmin Muskrat, MD   75 mg at 10/15/14 0842  . zolpidem (AMBIEN) tablet 5 mg  5 mg Oral QHS PRN Nat Christen, MD       Current Outpatient Prescriptions  Medication Sig Dispense Refill  . amLODipine (NORVASC) 2.5 MG tablet Take 2.5 mg by mouth daily.    . Ascorbic Acid (VITAMIN C) 1000 MG tablet Take 1,000 mg by mouth daily.    . Cholecalciferol (VITAMIN D) 2000 UNITS CAPS Take 2,000 Units by mouth daily.     Marland Kitchen docusate sodium 100 MG CAPS Take 100 mg by mouth 2 (two) times daily. (Patient taking differently: Take 100 mg by mouth daily. ) 10 capsule 0  . iron polysaccharides (NIFEREX) 150 MG capsule Take 150 mg by mouth daily.    . metFORMIN (GLUCOPHAGE) 500 MG tablet Take 500 mg by mouth 2 (two) times daily with a meal.     . naproxen (NAPROSYN) 375 MG tablet Take 375 mg by mouth 2 (two) times daily as needed for mild pain.     Marland Kitchen omeprazole (PRILOSEC) 20 MG capsule Take 20 mg by mouth daily.    Marland Kitchen oxybutynin (DITROPAN) 5 MG tablet Take 5 mg by mouth daily.    . pravastatin (PRAVACHOL) 40 MG tablet Take 40 mg by mouth daily.    . TRESIBA FLEXTOUCH 200 UNIT/ML SOPN Inject 10 Units into the skin daily.    Marland Kitchen venlafaxine (EFFEXOR) 37.5 MG tablet Take 37.5 mg by mouth daily.    . vitamin B-12 (CYANOCOBALAMIN) 1000 MCG tablet Take 1,000 mcg by  mouth daily.    Marland Kitchen oxyCODONE-acetaminophen (PERCOCET) 10-325 MG per tablet Take 1 tablet by mouth every 4 (four) hours as needed for  pain. (Patient not taking: Reported on 10/13/2014) 30 tablet 0  . risperiDONE (RISPERDAL M-TABS) 0.5 MG disintegrating tablet Take 1 tablet (0.5 mg total) by mouth at bedtime. 30 tablet 0  . venlafaxine XR (EFFEXOR-XR) 75 MG 24 hr capsule Take 1 capsule (75 mg total) by mouth daily with breakfast. 30 capsule 0    Musculoskeletal: Strength & Muscle Tone: was seen in bed lying down Gait & Station: was seen in bed lying down Patient leans: see above  Psychiatric Specialty Exam: Physical Exam  Review of Systems  Constitutional: Negative.   HENT: Negative.   Eyes: Negative.   Respiratory: Negative.   Cardiovascular: Negative.   Gastrointestinal: Negative.   Genitourinary: Negative.   Musculoskeletal: Negative.   Skin: Negative.   Neurological: Negative.   Endo/Heme/Allergies: Negative.     Blood pressure 119/59, pulse 91, temperature 97.8 F (36.6 C), temperature source Oral, resp. rate 16, SpO2 99 %.There is no weight on file to calculate BMI.  General Appearance: Casual  Eye Contact::  Good  Speech:  Clear and Coherent and Normal Rate  Volume:  Normal  Mood:  Anxious  Affect:  Congruent  Thought Process:  Coherent  Orientation:  Full (Time, Place, and Person)  Thought Content:  Reports decreased Paranoia since last night on Risperdal  Suicidal Thoughts:  No  Homicidal Thoughts:  No  Memory:  Immediate;   Good Recent;   Fair Remote;   Fair  Judgement:  Impaired  Insight:  Shallow  Psychomotor Activity:  Normal  Concentration:  Fair  Recall:  Norman  Language: Good  Akathisia:  NA  Handed:  Right  AIMS (if indicated):     Assets:  Desire for Improvement  ADL's:  Intact  Cognition: WNL  Sleep:      Medical Decision Making: Established Problem, Stable/Improving (1)  Disposition:  Discharge home, [facility]  Delfin Gant   PMHNP-BC 10/15/2014 3:54 PM . Patient seen face-to-face for this psychiatric evaluation, case discussed with treatment team, physician extender and treatment plan formulated. Reviewed the information documented and agree with the treatment plan.   Srija Southard,JANARDHAHA R. 10/16/2014 4:33 PM

## 2014-10-17 ENCOUNTER — Emergency Department
Admission: EM | Admit: 2014-10-17 | Discharge: 2014-10-18 | Disposition: A | Discharge status: Home / Self Care | Payer: Medicare Other | Attending: Emergency Medicine | Admitting: Emergency Medicine

## 2014-10-17 ENCOUNTER — Encounter: Payer: Self-pay | Admitting: Emergency Medicine

## 2014-10-17 DIAGNOSIS — Z79899 Other long term (current) drug therapy: Secondary | ICD-10-CM | POA: Diagnosis not present

## 2014-10-17 DIAGNOSIS — F329 Major depressive disorder, single episode, unspecified: Secondary | ICD-10-CM

## 2014-10-17 DIAGNOSIS — E119 Type 2 diabetes mellitus without complications: Secondary | ICD-10-CM | POA: Insufficient documentation

## 2014-10-17 DIAGNOSIS — I10 Essential (primary) hypertension: Secondary | ICD-10-CM | POA: Diagnosis not present

## 2014-10-17 DIAGNOSIS — F29 Unspecified psychosis not due to a substance or known physiological condition: Secondary | ICD-10-CM | POA: Insufficient documentation

## 2014-10-17 DIAGNOSIS — R45851 Suicidal ideations: Secondary | ICD-10-CM

## 2014-10-17 DIAGNOSIS — R32 Unspecified urinary incontinence: Secondary | ICD-10-CM

## 2014-10-17 DIAGNOSIS — Z87891 Personal history of nicotine dependence: Secondary | ICD-10-CM | POA: Insufficient documentation

## 2014-10-17 DIAGNOSIS — F419 Anxiety disorder, unspecified: Secondary | ICD-10-CM | POA: Diagnosis not present

## 2014-10-17 LAB — URINE DRUG SCREEN, QUALITATIVE (ARMC ONLY)
AMPHETAMINES, UR SCREEN: NOT DETECTED
Barbiturates, Ur Screen: NOT DETECTED
Benzodiazepine, Ur Scrn: POSITIVE — AB
CANNABINOID 50 NG, UR ~~LOC~~: NOT DETECTED
COCAINE METABOLITE, UR ~~LOC~~: NOT DETECTED
MDMA (Ecstasy)Ur Screen: NOT DETECTED
METHADONE SCREEN, URINE: NOT DETECTED
Opiate, Ur Screen: NOT DETECTED
Phencyclidine (PCP) Ur S: NOT DETECTED
TRICYCLIC, UR SCREEN: NOT DETECTED

## 2014-10-17 LAB — COMPREHENSIVE METABOLIC PANEL
ALT: 31 U/L (ref 14–54)
ANION GAP: 9 (ref 5–15)
AST: 37 U/L (ref 15–41)
Albumin: 4.2 g/dL (ref 3.5–5.0)
Alkaline Phosphatase: 67 U/L (ref 38–126)
BUN: 47 mg/dL — AB (ref 6–20)
CALCIUM: 9.8 mg/dL (ref 8.9–10.3)
CO2: 23 mmol/L (ref 22–32)
Chloride: 106 mmol/L (ref 101–111)
Creatinine, Ser: 1.49 mg/dL — ABNORMAL HIGH (ref 0.44–1.00)
GFR calc non Af Amer: 37 mL/min — ABNORMAL LOW (ref >60)
GFR, EST AFRICAN AMERICAN: 43 mL/min — AB (ref >60)
Glucose, Bld: 198 mg/dL — ABNORMAL HIGH (ref 65–99)
POTASSIUM: 4.3 mmol/L (ref 3.5–5.1)
SODIUM: 138 mmol/L (ref 135–145)
TOTAL PROTEIN: 7.8 g/dL (ref 6.5–8.1)
Total Bilirubin: 0.3 mg/dL (ref 0.3–1.2)

## 2014-10-17 LAB — CBC
HCT: 35.9 % (ref 35.0–47.0)
Hemoglobin: 11.4 g/dL — ABNORMAL LOW (ref 12.0–16.0)
MCH: 25.7 pg — AB (ref 26.0–34.0)
MCHC: 31.7 g/dL — ABNORMAL LOW (ref 32.0–36.0)
MCV: 81 fL (ref 80.0–100.0)
Platelets: 198 10*3/uL (ref 150–440)
RBC: 4.43 MIL/uL (ref 3.80–5.20)
RDW: 16.1 % — ABNORMAL HIGH (ref 11.5–14.5)
WBC: 7.5 10*3/uL (ref 3.6–11.0)

## 2014-10-17 LAB — GLUCOSE, CAPILLARY
GLUCOSE-CAPILLARY: 76 mg/dL (ref 65–99)
Glucose-Capillary: 77 mg/dL (ref 65–99)

## 2014-10-17 MED ORDER — VENLAFAXINE HCL ER 75 MG PO CP24
75.0000 mg | ORAL_CAPSULE | Freq: Every day | ORAL | Status: DC
  Administered 2014-10-18: 75 mg via ORAL
  Filled 2014-10-17: qty 1

## 2014-10-17 MED ORDER — DIPHENHYDRAMINE HCL 50 MG/ML IJ SOLN
INTRAMUSCULAR | Status: AC
  Administered 2014-10-17: 50 mg via INTRAMUSCULAR
  Filled 2014-10-17: qty 1

## 2014-10-17 MED ORDER — AMLODIPINE BESYLATE 5 MG PO TABS
2.5000 mg | ORAL_TABLET | Freq: Every day | ORAL | Status: DC
  Administered 2014-10-18: 2.5 mg via ORAL
  Filled 2014-10-17: qty 1

## 2014-10-17 MED ORDER — PANTOPRAZOLE SODIUM 40 MG PO TBEC
40.0000 mg | DELAYED_RELEASE_TABLET | Freq: Every day | ORAL | Status: DC
  Administered 2014-10-18: 40 mg via ORAL
  Filled 2014-10-17: qty 1

## 2014-10-17 MED ORDER — OXYBUTYNIN CHLORIDE 5 MG PO TABS
5.0000 mg | ORAL_TABLET | Freq: Two times a day (BID) | ORAL | Status: DC
  Administered 2014-10-18: 5 mg via ORAL
  Filled 2014-10-17 (×2): qty 1

## 2014-10-17 MED ORDER — RISPERIDONE 0.5 MG PO TBDP
0.5000 mg | ORAL_TABLET | Freq: Every day | ORAL | Status: DC
  Filled 2014-10-17: qty 1

## 2014-10-17 MED ORDER — HALOPERIDOL LACTATE 5 MG/ML IJ SOLN
5.0000 mg | Freq: Once | INTRAMUSCULAR | Status: AC
  Administered 2014-10-17: 5 mg via INTRAMUSCULAR

## 2014-10-17 MED ORDER — METFORMIN HCL 500 MG PO TABS
500.0000 mg | ORAL_TABLET | Freq: Two times a day (BID) | ORAL | Status: DC
  Administered 2014-10-18: 500 mg via ORAL
  Filled 2014-10-17: qty 1

## 2014-10-17 MED ORDER — HALOPERIDOL LACTATE 5 MG/ML IJ SOLN
INTRAMUSCULAR | Status: AC
  Administered 2014-10-17: 5 mg via INTRAMUSCULAR
  Filled 2014-10-17: qty 1

## 2014-10-17 MED ORDER — DIPHENHYDRAMINE HCL 50 MG/ML IJ SOLN
50.0000 mg | Freq: Once | INTRAMUSCULAR | Status: AC
  Administered 2014-10-17: 50 mg via INTRAMUSCULAR

## 2014-10-17 MED ORDER — PRAVASTATIN SODIUM 40 MG PO TABS
40.0000 mg | ORAL_TABLET | Freq: Every day | ORAL | Status: DC
  Filled 2014-10-17: qty 1

## 2014-10-17 MED ORDER — LORAZEPAM 2 MG PO TABS
2.0000 mg | ORAL_TABLET | Freq: Once | ORAL | Status: AC
  Administered 2014-10-17: 2 mg via ORAL

## 2014-10-17 MED ORDER — LORAZEPAM 2 MG PO TABS
ORAL_TABLET | ORAL | Status: AC
  Administered 2014-10-17: 2 mg via ORAL
  Filled 2014-10-17: qty 1

## 2014-10-17 NOTE — ED Notes (Signed)
ENVIRONMENTAL ASSESSMENT Potentially harmful objects out of patient reach: Yes.   Personal belongings secured: Yes.   Patient dressed in hospital provided attire only: No. Plastic bags out of patient reach: Yes.   Patient care equipment (cords, cables, call bells, lines, and drains) shortened, removed, or accounted for: Yes.   Equipment and supplies removed from bottom of stretcher: Yes.   Potentially toxic materials out of patient reach: Yes.   Sharps container removed or out of patient reach: Yes.

## 2014-10-17 NOTE — ED Notes (Signed)
BEHAVIORAL HEALTH ROUNDING Patient sleeping: Yes.   Patient alert and oriented: no Behavior appropriate: Yes.  ; If no, describe:  Nutrition and fluids offered: No Toileting and hygiene offered: No Sitter present: no Law enforcement present: Yes, ODS

## 2014-10-17 NOTE — ED Notes (Signed)
Pt resting in bed with eyes closed. No unusual behavior observed. Pt has no needs or concerns at this time. Will continue to monitor and f/u as needed.  

## 2014-10-17 NOTE — ED Notes (Signed)
Pt report received from [employer]. Pt care assumed. Pt resting in bed with eyes closed at this time.

## 2014-10-17 NOTE — ED Notes (Signed)
Pt to ed with c/o depression, stress, anxiety, for several weeks.  Pt states "I want to kill myself if they won't help me, my nerves are all tore up"  Pt has anxiety and depression meds but she has not taken them today.

## 2014-10-17 NOTE — ED Notes (Signed)
Pt continued to remain anxious stating "i am going to kill myself, do you understand that". MD ( Dr. Joni Fears) and Nurse in room. PT unable to express what she had planned, pt states "i just want to die". Nurse remained in room, pt

## 2014-10-17 NOTE — Consult Note (Signed)
[facility] Consult   Reason for Consult:  Consult for 64 year old woman with a recent diagnosis of depression and psychosis who is here under involuntary commitment paperwork Referring Physician:  Joni Fears Patient Identification: Erika Jacobs MRN:  [id number] Principal Diagnosis: Depression Diagnosis:   Patient Active Problem List   Diagnosis Date Noted  . Diabetes [E11.9] 10/17/2014  . Hypertension [I10] 10/17/2014  . Elevated cholesterol [E78.0] 10/17/2014  . Urinary incontinence [R32] 10/17/2014  . Psychoses [F29]   . Psychosis [F29] 10/14/2014  . Depression [F32.9]   . Suicidal ideation [R45.851]   . Cholecystitis with cholelithiasis [K80.10] 06/02/2013    Total Time spent with patient: 1 hour  Subjective:   Erika Jacobs is a 64 y.o. female patient admitted with "I'm hearing voices and I'm depressed". Patient reporting multiple symptoms of depression although she is not a very forthcoming historian.  HPI:  Information from the patient and the chart. Patient came to the emergency room today stating that she was anxious and needed help immediately or she was going to kill her self. She was given Benadryl Haldol and Ativan around noon today and has been very sedated ever since. I was finally able to get a little conversation with her. She tells me that her mood is very depressed and sad. She thinks it's been that way for years. She can't specify how long it's been getting worse. She says she has terrible stress on her but when I asked what it was all she can tell me is that her children bother her. She is sleeping poorly at night. Not eating. Says that she hears voices. She will not specify anything about what they actually say. All she says is "all kinds of things". She states that she is having suicidal ideation. She does not state any specific plan. This patient was just seen at [facility] emergency room 2 days ago for identical or similar complaints. She  stabilized in the day and was discharged with a plan to go to outpatient treatment. Apparently she did not follow up on it. She has not been able to tell me what has been bothering her since then.  Past psychiatric history: No known history of past psychiatric hospitalization. She had been treated with Effexor by a primary care doctor. Denies any history of suicide attempts. These psychotic symptoms appear to be a new complaint. Patient however states that her symptoms of been present for years.  Medical history: Diabetes, high blood pressure, elevated cholesterol, history of urinary incontinence, history of gallstones  Social history: Patient tells me that she lives by herself. This contradicts what she had said earlier when she said she lived in a house full of crazy people. She is not able to give me any further detail about her family or interactions at this point.  Substance abuse history: Denies any history of alcohol abuse or drug abuse. None documented. Ref current medication: Psychiatrically she is prescribed Risperdal M tabs a half milligram at night and Effexor extended release 75 mg a day. HPI Elements:   Quality:  Depression suicidal thoughts and hallucinations. Severity:  Severe and potentially life threatening. Timing:  Evidently have been going on for years but worse just in the last few weeks. Duration:  Chronic but worsening recently and up and down from day to day. Context:  Unknown what exactly might be causing this worsening.  Past Medical History:  Past Medical History  Diagnosis Date  . Diabetes mellitus without complication   .  Panic attack   . Hypertension   . Hypercholesteremia   . GERD (gastroesophageal reflux disease)   . Hypothyroid   . Overactive bladder   . Arthritis   . Anemia   . Shortness of breath     Past Surgical History  Procedure Laterality Date  . Tubal ligation    . Eye surgery Left   . Colonoscopy with esophagogastroduodenoscopy (egd) N/A  05/20/2013    Procedure: COLONOSCOPY WITH ESOPHAGOGASTRODUODENOSCOPY (EGD);  Surgeon: Rogene Houston, MD;  Location: [facility];  Service: Endoscopy;  Laterality: N/A;  730  . Cholecystectomy N/A 06/02/2013    Procedure: LAPAROSCOPIC CHOLECYSTECTOMY;  Surgeon: Scherry Ran, MD;  Location: [facility] ORS;  Service: General;  Laterality: N/A;   Family History: History reviewed. No pertinent family history. Social History:  History  Alcohol Use No     History  Drug Use No    History   Social History  . Marital Status: Divorced    Spouse Name: N/A  . Number of Children: N/A  . Years of Education: N/A   Social History Main Topics  . Smoking status: Former Smoker -- 2.00 packs/day for 30 years    Types: Cigarettes    Quit date: 05/20/2002  . Smokeless tobacco: Not on file  . Alcohol Use: No  . Drug Use: No  . Sexual Activity: Yes    Birth Control/ Protection: Post-menopausal   Other Topics Concern  . None   Social History Narrative   Additional Social History:                          Allergies:  No Known Allergies  Labs:  Results for orders placed or performed during the hospital encounter of 10/17/14 (from the past 48 hour(s))  CBC     Status: Abnormal   Collection Time: 10/17/14  9:25 AM  Result Value Ref Range   WBC 7.5 3.6 - 11.0 K/uL   RBC 4.43 3.80 - 5.20 MIL/uL   Hemoglobin 11.4 (L) 12.0 - 16.0 g/dL   HCT 35.9 35.0 - 47.0 %   MCV 81.0 80.0 - 100.0 fL   MCH 25.7 (L) 26.0 - 34.0 pg   MCHC 31.7 (L) 32.0 - 36.0 g/dL   RDW 16.1 (H) 11.5 - 14.5 %   Platelets 198 150 - 440 K/uL  Comprehensive metabolic panel     Status: Abnormal   Collection Time: 10/17/14  9:25 AM  Result Value Ref Range   Sodium 138 135 - 145 mmol/L   Potassium 4.3 3.5 - 5.1 mmol/L   Chloride 106 101 - 111 mmol/L   CO2 23 22 - 32 mmol/L   Glucose, Bld 198 (H) 65 - 99 mg/dL   BUN 47 (H) 6 - 20 mg/dL   Creatinine, Ser 1.49 (H) 0.44 - 1.00 mg/dL   Calcium 9.8 8.9 - 10.3 mg/dL    Total Protein 7.8 6.5 - 8.1 g/dL   Albumin 4.2 3.5 - 5.0 g/dL   AST 37 15 - 41 U/L   ALT 31 14 - 54 U/L   Alkaline Phosphatase 67 38 - 126 U/L   Total Bilirubin 0.3 0.3 - 1.2 mg/dL   GFR calc non Af Amer 37 (L) >60 mL/min   GFR calc Af Amer 43 (L) >60 mL/min    Comment: (NOTE) The eGFR has been calculated using the CKD EPI equation. This calculation has not been validated in all clinical situations. eGFR's persistently <60  mL/min signify possible Chronic Kidney Disease.    Anion gap 9 5 - 15  Urine Drug Screen, Qualitative ([facility] only)     Status: Abnormal   Collection Time: 10/17/14  9:25 AM  Result Value Ref Range   Tricyclic, Ur Screen NONE DETECTED NONE DETECTED   Amphetamines, Ur Screen NONE DETECTED NONE DETECTED   MDMA (Ecstasy)Ur Screen NONE DETECTED NONE DETECTED   Cocaine Metabolite,Ur [place] NONE DETECTED NONE DETECTED   Opiate, Ur Screen NONE DETECTED NONE DETECTED   Phencyclidine (PCP) Ur S NONE DETECTED NONE DETECTED   Cannabinoid 50 Ng, Ur [place] NONE DETECTED NONE DETECTED   Barbiturates, Ur Screen NONE DETECTED NONE DETECTED   Benzodiazepine, Ur Scrn POSITIVE (A) NONE DETECTED   Methadone Scn, Ur NONE DETECTED NONE DETECTED    Comment: (NOTE) 096  Tricyclics, urine               Cutoff 1000 ng/mL 200  Amphetamines, urine             Cutoff 1000 ng/mL 300  MDMA (Ecstasy), urine           Cutoff 500 ng/mL 400  Cocaine Metabolite, urine       Cutoff 300 ng/mL 500  Opiate, urine                   Cutoff 300 ng/mL 600  Phencyclidine (PCP), urine      Cutoff 25 ng/mL 700  Cannabinoid, urine              Cutoff 50 ng/mL 800  Barbiturates, urine             Cutoff 200 ng/mL 900  Benzodiazepine, urine           Cutoff 200 ng/mL 1000 Methadone, urine                Cutoff 300 ng/mL 1100 1200 The urine drug screen provides only a preliminary, unconfirmed 1300 analytical test result and should not be used for non-medical 1400 purposes. Clinical consideration and professional  judgment should 1500 be applied to any positive drug screen result due to possible 1600 interfering substances. A more specific alternate chemical method 1700 must be used in order to obtain a confirmed analytical result.  1800 Gas chromato graphy / mass spectrometry (GC/MS) is the preferred 1900 confirmatory method.     Vitals: Blood pressure 136/71, pulse 113, temperature 97.7 F (36.5 C), temperature source Oral, resp. rate 20, height 5' (1.524 m), weight 87.091 kg (192 lb), SpO2 100 %.  Risk to Self: Suicidal Ideation: Yes-Currently Present Suicidal Intent: Yes-Currently Present Is patient at risk for suicide?: Yes Suicidal Plan?: No Access to Means:  (unknown) Specify Access to Suicidal Means: states yes; does not state exactly what What has been your use of drugs/alcohol within the last 12 months?: none How many times?: 0 Other Self Harm Risks: making threatening self harm statements Triggers for Past Attempts: None known Intentional Self Injurious Behavior: None Risk to Others: Homicidal Ideation: No Thoughts of Harm to Others: No Current Homicidal Intent: No Current Homicidal Plan: No Access to Homicidal Means: No Identified Victim: none History of harm to others?: No Assessment of Violence: On admission Violent Behavior Description: none Does patient have access to weapons?: No Criminal Charges Pending?: No Does patient have a court date: No Prior Inpatient Therapy: Prior Inpatient Therapy: No Prior Outpatient Therapy: Prior Outpatient Therapy: No Does patient have an ACCT team?: No Does patient have Intensive  In-House Services?  : No Does patient have [facility] services? : No Does patient have [facility] services?: No  Current Facility-Administered Medications  Medication Dose Route Frequency Provider Last Rate Last Dose  . amLODipine (NORVASC) tablet 2.5 mg  2.5 mg Oral Daily Gonzella Lex, MD      . metFORMIN (GLUCOPHAGE) tablet 500 mg  500 mg Oral BID AC Gonzella Lex, MD      . oxybutynin (DITROPAN) tablet 5 mg  5 mg Oral BID Gonzella Lex, MD      . pantoprazole (PROTONIX) EC tablet 40 mg  40 mg Oral Daily Gonzella Lex, MD      . pravastatin (PRAVACHOL) tablet 40 mg  40 mg Oral q1800 Gonzella Lex, MD      . risperiDONE (RISPERDAL M-TABS) disintegrating tablet 0.5 mg  0.5 mg Oral QHS Gonzella Lex, MD      . Derrill Memo ON 10/18/2014] venlafaxine XR (EFFEXOR-XR) 24 hr capsule 75 mg  75 mg Oral Q breakfast Gonzella Lex, MD       Current Outpatient Prescriptions  Medication Sig Dispense Refill  . amLODipine (NORVASC) 2.5 MG tablet Take 2.5 mg by mouth daily.    . Ascorbic Acid (VITAMIN C) 1000 MG tablet Take 1,000 mg by mouth daily.    . Cholecalciferol (VITAMIN D) 2000 UNITS CAPS Take 2,000 Units by mouth daily.     Marland Kitchen docusate sodium 100 MG CAPS Take 100 mg by mouth 2 (two) times daily. (Patient taking differently: Take 100 mg by mouth daily. ) 10 capsule 0  . iron polysaccharides (NIFEREX) 150 MG capsule Take 150 mg by mouth daily.    . metFORMIN (GLUCOPHAGE) 500 MG tablet Take 500 mg by mouth 2 (two) times daily with a meal.     . omeprazole (PRILOSEC) 20 MG capsule Take 20 mg by mouth daily.    Marland Kitchen oxybutynin (DITROPAN) 5 MG tablet Take 5 mg by mouth daily.    . pravastatin (PRAVACHOL) 40 MG tablet Take 40 mg by mouth daily.    . risperiDONE (RISPERDAL M-TABS) 0.5 MG disintegrating tablet Take 1 tablet (0.5 mg total) by mouth at bedtime. 30 tablet 0  . venlafaxine XR (EFFEXOR-XR) 75 MG 24 hr capsule Take 1 capsule (75 mg total) by mouth daily with breakfast. 30 capsule 0  . vitamin B-12 (CYANOCOBALAMIN) 1000 MCG tablet Take 1,000 mcg by mouth daily.    . naproxen (NAPROSYN) 375 MG tablet Take 375 mg by mouth 2 (two) times daily as needed for mild pain.       Musculoskeletal: Strength & Muscle Tone: within normal limits Gait & Station: unable to stand Patient leans: N/A  Psychiatric Specialty Exam: Physical Exam  Constitutional: She  appears well-developed and well-nourished.  HENT:  Head: Normocephalic and atraumatic.  Eyes: Conjunctivae are normal. Pupils are equal, round, and reactive to light.  Neck: Normal range of motion.  Cardiovascular: Normal heart sounds.   Respiratory: Effort normal.  GI: Soft.  Musculoskeletal: Normal range of motion.  Neurological: She is alert.  Skin: Skin is warm and dry.  Psychiatric: Her speech is delayed. She is slowed and withdrawn. Cognition and memory are impaired. She expresses impulsivity. She exhibits a depressed mood. She expresses suicidal ideation. She exhibits abnormal recent memory.  Patient presents as a very withdrawn. Made no eye contact.    Review of Systems  Constitutional: Negative.   HENT: Negative.   Eyes: Negative.   Respiratory: Negative.   Cardiovascular:  Negative.   Gastrointestinal: Negative.   Musculoskeletal: Negative.   Skin: Negative.   Neurological: Negative.   Psychiatric/Behavioral: Positive for depression, suicidal ideas, hallucinations and memory loss. Negative for substance abuse. The patient is nervous/anxious and has insomnia.     Blood pressure 136/71, pulse 113, temperature 97.7 F (36.5 C), temperature source Oral, resp. rate 20, height 5' (1.524 m), weight 87.091 kg (192 lb), SpO2 100 %.Body mass index is 37.5 kg/(m^2).  General Appearance: Disheveled and Guarded  Eye Contact::  Absent  Speech:  Garbled and Slow  Volume:  Decreased  Mood:  Depressed  Affect:  Blunt  Thought Process:  Tangential  Orientation:  Full (Time, Place, and Person)  Thought Content:  Hallucinations: Auditory  Suicidal Thoughts:  Yes.  without intent/plan  Homicidal Thoughts:  No  Memory:  Immediate;   Fair Recent;   Poor Remote;   Poor  Judgement:  Impaired  Insight:  Lacking  Psychomotor Activity:  Decreased  Concentration:  Fair  Recall:  [employer] of Knowledge:Poor  Language: Fair  Akathisia:  No  Handed:  Right  AIMS (if indicated):      Assets:  Housing Social Support  ADL's:  Intact  Cognition: Impaired,  Mild  Sleep:      Medical Decision Making: New problem, with additional work up planned, Review of Psycho-Social Stressors (1), Review or order clinical lab tests (1), Review of Medication Regimen & Side Effects (2) and Review of New Medication or Change in Dosage (2)  Treatment Plan Summary: Medication management and Plan This 64 year old woman is presenting with a somewhat unusual presentation. This is the second or third time within a few days that she has presented with initial complaints of severe symptoms. Just a couple days ago they seem too of cleared up overnight. It's also notable to me that her symptoms are not consistent and are stated differently at different times even within the same hospital visit. It is possible that this patient may be feigning some of her symptoms for some reason although I don't have any direct proof. Right now she is still pretty sedated. Not really interactive. Based on her history she wouldn't meet criteria for inpatient hospitalization. We do not have any beds right now. I will continue her outpatient medication and we will reevaluate tomorrow for possible admission. Patient advised of the plan.  Plan:  Recommend psychiatric Inpatient admission when medically cleared. Supportive therapy provided about ongoing stressors. Disposition: Continue IVC. Possible admission tomorrow if still sick. Continue current medicine.  Viaan Knippenberg 10/17/2014 5:44 PM

## 2014-10-17 NOTE — ED Notes (Signed)
SItter at bedside 

## 2014-10-17 NOTE — ED Notes (Signed)
IVC/Consult ordered/All paperwork on chart

## 2014-10-17 NOTE — ED Notes (Signed)
Pt. Resting in bed. Pt anxious, stating "i need that mediations before i kill myself" Nurse attempted to communicate with pt, but pt remained anxious. Bed in lowest position, door left open.

## 2014-10-17 NOTE — ED Notes (Signed)
BEHAVIORAL HEALTH ROUNDING Patient sleeping: Yes.   Patient alert and oriented: not applicable Behavior appropriate: Yes.  ; If no, describe:  Nutrition and fluids offered: Yes  Toileting and hygiene offered: Yes  Sitter present: yes Law enforcement present: Yes  

## 2014-10-17 NOTE — ED Provider Notes (Signed)
[facility] Emergency Department Provider Note  ____________________________________________  Time seen: 9:40 AM  I have reviewed the triage vital signs and the nursing notes.   HISTORY  Chief Complaint Depression    HPI Erika Jacobs is a 64 y.o. female who complains of stress and anxiety. She states that she came to the ER today because she was referred here by Zacarias Pontes. Review of the records reveals that the patient was in the [facility] ER on June 25, and they plan to admit her for psychiatric stabilization with psychosis. However, the patient had a very good response to risperidoneand was discharged home with instructions to follow-up with [facility] [facility]. She notes that she is had ongoing racing thoughts and paranoia as she usually does. She also had a brief time period of suicidal thoughts this morning without any plans. The patient reports feeling at her baseline psychiatric state, and would like to be evaluated by psychiatry.     Past Medical History  Diagnosis Date  . Diabetes mellitus without complication   . Panic attack   . Hypertension   . Hypercholesteremia   . GERD (gastroesophageal reflux disease)   . Hypothyroid   . Overactive bladder   . Arthritis   . Anemia   . Shortness of breath     Patient Active Problem List   Diagnosis Date Noted  . Psychoses   . Psychosis 10/14/2014  . Depression   . Suicidal ideation   . Cholecystitis with cholelithiasis 06/02/2013    Past Surgical History  Procedure Laterality Date  . Tubal ligation    . Eye surgery Left   . Colonoscopy with esophagogastroduodenoscopy (egd) N/A 05/20/2013    Procedure: COLONOSCOPY WITH ESOPHAGOGASTRODUODENOSCOPY (EGD);  Surgeon: Rogene Houston, MD;  [facility];  Service: Endoscopy;  Laterality: N/A;  730  . Cholecystectomy N/A 06/02/2013    Procedure: LAPAROSCOPIC CHOLECYSTECTOMY;  Surgeon: Scherry Ran, MD;  Location:  [facility] ORS;  Service: General;  Laterality: N/A;    Current Outpatient Rx  Name  Route  Sig  Dispense  Refill  . amLODipine (NORVASC) 2.5 MG tablet   Oral   Take 2.5 mg by mouth daily.         . Ascorbic Acid (VITAMIN C) 1000 MG tablet   Oral   Take 1,000 mg by mouth daily.         . Cholecalciferol (VITAMIN D) 2000 UNITS CAPS   Oral   Take 2,000 Units by mouth daily.          Marland Kitchen docusate sodium 100 MG CAPS   Oral   Take 100 mg by mouth 2 (two) times daily. Patient taking differently: Take 100 mg by mouth daily.    10 capsule   0   . iron polysaccharides (NIFEREX) 150 MG capsule   Oral   Take 150 mg by mouth daily.         . metFORMIN (GLUCOPHAGE) 500 MG tablet   Oral   Take 500 mg by mouth 2 (two) times daily with a meal.          . omeprazole (PRILOSEC) 20 MG capsule   Oral   Take 20 mg by mouth daily.         Marland Kitchen oxybutynin (DITROPAN) 5 MG tablet   Oral   Take 5 mg by mouth daily.         . pravastatin (PRAVACHOL) 40 MG tablet   Oral  Take 40 mg by mouth daily.         . risperiDONE (RISPERDAL M-TABS) 0.5 MG disintegrating tablet   Oral   Take 1 tablet (0.5 mg total) by mouth at bedtime.   30 tablet   0   . venlafaxine XR (EFFEXOR-XR) 75 MG 24 hr capsule   Oral   Take 1 capsule (75 mg total) by mouth daily with breakfast.   30 capsule   0   . vitamin B-12 (CYANOCOBALAMIN) 1000 MCG tablet   Oral   Take 1,000 mcg by mouth daily.         . naproxen (NAPROSYN) 375 MG tablet   Oral   Take 375 mg by mouth 2 (two) times daily as needed for mild pain.            Allergies Review of patient's allergies indicates no known allergies.  History reviewed. No pertinent family history.  Social History History  Substance Use Topics  . Smoking status: Former Smoker -- 2.00 packs/day for 30 years    Types: Cigarettes    Quit date: 05/20/2002  . Smokeless tobacco: Not on file  . Alcohol Use: No    Review of Systems  Constitutional: No  fever or chills. No weight changes Eyes:No blurry vision or double vision.  ENT: No sore throat. Cardiovascular: No chest pain. Respiratory: No dyspnea or cough. Gastrointestinal: Negative for abdominal pain, vomiting and diarrhea.  No BRBPR or melena. Genitourinary: Negative for dysuria, urinary retention, bloody urine, or difficulty urinating. Musculoskeletal: Negative for back pain. No joint swelling or pain. Skin: Negative for rash. Neurological: Negative for headaches, focal weakness or numbness. Psychiatric:No anxiety or depression.   Endocrine:No hot/cold intolerance, changes in energy, or sleep difficulty.  10-point ROS otherwise negative.  ____________________________________________   PHYSICAL EXAM:  VITAL SIGNS: ED Triage Vitals  Enc Vitals Group     BP 10/17/14 0921 136/71 mmHg     Pulse Rate 10/17/14 0921 113     Resp 10/17/14 0921 20     Temp 10/17/14 0921 97.7 F (36.5 C)     Temp Source 10/17/14 0921 Oral     SpO2 10/17/14 0921 100 %     Weight 10/17/14 0921 192 lb (87.091 kg)     Height 10/17/14 0921 5' (1.524 m)     Head Cir --      Peak Flow --      Pain Score 10/17/14 0921 0     Pain Loc --      Pain Edu? --      Excl. in [place]? --      Constitutional: Alert and oriented. Well appearing and in no distress. Eyes: No scleral icterus. No conjunctival pallor. PERRL. EOMI ENT   Head: Normocephalic and atraumatic.   Nose: No congestion/rhinnorhea. No septal hematoma   Mouth/Throat: MMM, no pharyngeal erythema. No peritonsillar mass. No uvula shift.   Neck: No stridor. No SubQ emphysema. No meningismus. Hematological/Lymphatic/Immunilogical: No cervical lymphadenopathy. Cardiovascular: RRR. Normal and symmetric distal pulses are present in all extremities. No murmurs, rubs, or gallops. Respiratory: Normal respiratory effort without tachypnea nor retractions. Breath sounds are clear and equal bilaterally. No  wheezes/rales/rhonchi. Gastrointestinal: Soft and nontender. No distention. There is no CVA tenderness.  No rebound, rigidity, or guarding. Genitourinary: deferred Musculoskeletal: Nontender with normal range of motion in all extremities. No joint effusions.  No lower extremity tenderness.  No edema. Neurologic:   Normal speech and language.  CN 2-10 normal. Motor grossly intact. No  pronator drift.  Normal gait. No gross focal neurologic deficits are appreciated.  Skin:  Skin is warm, dry and intact. No rash noted.  No petechiae, purpura, or bullae. Psychiatric: Anxious affect, has trouble giving clear appropriate answers to questioning about sequence of events and reason for presentation. ____________________________________________    LABS (pertinent positives/negatives) (all labs ordered are listed, but only abnormal results are displayed) Labs Reviewed  CBC - Abnormal; Notable for the following:    Hemoglobin 11.4 (*)    MCH 25.7 (*)    MCHC 31.7 (*)    RDW 16.1 (*)    All other components within normal limits  COMPREHENSIVE METABOLIC PANEL - Abnormal; Notable for the following:    Glucose, Bld 198 (*)    BUN 47 (*)    Creatinine, Ser 1.49 (*)    GFR calc non Af Amer 37 (*)    GFR calc Af Amer 43 (*)    All other components within normal limits  URINE DRUG SCREEN, QUALITATIVE ([facility] ONLY) - Abnormal; Notable for the following:    Benzodiazepine, Ur Scrn POSITIVE (*)    All other components within normal limits   ____________________________________________   EKG    ____________________________________________    RADIOLOGY    ____________________________________________   PROCEDURES CRITICAL CARE Performed by: Joni Fears, German Manke   Total critical care time: 35 minutes  Critical care time was exclusive of separately billable procedures and treating other patients.  Critical care was necessary to treat or prevent imminent or life-threatening  deterioration.  Critical care was time spent personally by me on the following activities: development of treatment plan with patient and/or surrogate as well as nursing, discussions with consultants, evaluation of patient's response to treatment, examination of patient, obtaining history from patient or surrogate, ordering and performing treatments and interventions, ordering and review of laboratory studies, ordering and review of radiographic studies, pulse oximetry and re-evaluation of patient's condition.  ____________________________________________   INITIAL IMPRESSION / ASSESSMENT AND PLAN / ED COURSE  Pertinent labs & imaging results that were available during my care of the patient were reviewed by me and considered in my medical decision making (see chart for details).  Patient presents and her likely usual state of health. She is medically stable with no acute complaints at this time. She requests psychiatric evaluation according to her referral from [facility] in 2 days ago. No acute psychiatric complaints at this time although she does have recurrent suicidal ideation and chronic paranoia. We'll get a behavioral medicine intake assessment and proceed from there. ----------------------------------------- 11:20 AM on 10/17/2014 ----------------------------------------- Patient becoming increasingly agitated. Stating "let me out of here so I can kill myself." She is becoming increasingly disorganized, agitated, anxious, insisting that she wants to kill herself and that even though she is unable to describe a specific plan she is intent on doing so. Patient was given Ativan, IM Haldol,, IM Benadryl for chemical restraint, and involuntary commitment petition was initiated.  ----------------------------------------- 2:51 PM on 10/17/2014 -----------------------------------------  Patient now calm, sleeping. Awaiting psychiatric evaluation. She is medically stable at this time and lab  workup is unremarkable. We will await psychiatric evaluation and disposition. Care of the patient will be signed out to the oncoming provider.  ____________________________________________   FINAL CLINICAL IMPRESSION(S) / ED DIAGNOSES  Final diagnoses:  Psychosis, unspecified psychosis type  Suicidal ideation      Carrie Mew, MD 10/17/14 1451

## 2014-10-17 NOTE — ED Notes (Signed)
BEHAVIORAL HEALTH ROUNDING Patient sleeping: No. Patient alert and oriented: yes Behavior appropriate: Yes.   Nutrition and fluids offered: Yes  Toileting and hygiene offered: Yes  Sitter present: 15 min checks  Law enforcement present: Yes  

## 2014-10-17 NOTE — ED Notes (Signed)
Pt currently resting in bed at this time. Pt continues to cry. Sitter at bedside.

## 2014-10-17 NOTE — ED Notes (Signed)

## 2014-10-17 NOTE — ED Notes (Signed)
BEHAVIORAL HEALTH ROUNDING Patient sleeping: Yes.   Patient alert and oriented: not applicable Behavior appropriate: Yes.  ; If no, describe:  Nutrition and fluids offered: No Toileting and hygiene offered: Yes  Sitter present: yes Law enforcement present: Yes

## 2014-10-17 NOTE — ED Notes (Addendum)
Pt. Continued to remain anxious and walking back and forth in room. Pt continued to scream out "i want to kill myself, do you hear me". MD ( Dr. Joni Fears) and Nurse remains in room. Pt took her eye glasses off her face and broke them. Nurse step in and took glasses away from her. Glass are now currently at nurses station. MD ( Dr. Joni Fears) requested for sitter. Sitter currently at bedside. Pt continues to remain anxious but was able to sit down in bed.
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
## 2014-10-17 NOTE — ED Notes (Signed)
BEHAVIORAL HEALTH ROUNDING Patient sleeping: No. Patient alert and oriented: yes Behavior appropriate: No.; Pt was anxious and tearful in room  Nutrition and fluids offered: Yes  Toileting and hygiene offered: Yes  Sitter present: yes Law enforcement present: Yes

## 2014-10-17 NOTE — ED Notes (Signed)
Pt. Sleeping at this time. No acute distress noted. Sitter at bedside.

## 2014-10-17 NOTE — ED Notes (Addendum)
Pt. Laying in bed with sitter at bedside. Pt scratched her arm with her own finger nail. Bleeding controlled a this time, bandaide placed on site. Pt Continues to scream "i want to kill myself". Staff at bedside in attempt to keep pt calm.  MD ( Dr. Joni Fears) aware.

## 2014-10-17 NOTE — ED Notes (Signed)
Pt pacing back and forth in room stating "i want to kill myself, i am going to kill myself".  MD and Nurse x two in room to attempt calming pt down. Pt remained anxious and tearful.

## 2014-10-17 NOTE — ED Notes (Signed)
Pt's broken glasses that was broke earlier by pt was placed in pt's belongings bag. Nurse x two witnessed placement of glasses in pts belongings bag.

## 2014-10-18 ENCOUNTER — Inpatient Hospital Stay
Admission: EM | Admit: 2014-10-18 | Discharge: 2014-10-25 | DRG: 885 | Disposition: A | Discharge status: Home / Self Care | Payer: Medicare Other | Attending: Psychiatry | Admitting: Psychiatry

## 2014-10-18 DIAGNOSIS — Z87891 Personal history of nicotine dependence: Secondary | ICD-10-CM

## 2014-10-18 DIAGNOSIS — F315 Bipolar disorder, current episode depressed, severe, with psychotic features: Secondary | ICD-10-CM

## 2014-10-18 DIAGNOSIS — E559 Vitamin D deficiency, unspecified: Secondary | ICD-10-CM | POA: Diagnosis present

## 2014-10-18 DIAGNOSIS — E780 Pure hypercholesterolemia: Secondary | ICD-10-CM | POA: Diagnosis present

## 2014-10-18 DIAGNOSIS — F333 Major depressive disorder, recurrent, severe with psychotic symptoms: Secondary | ICD-10-CM

## 2014-10-18 DIAGNOSIS — K5900 Constipation, unspecified: Secondary | ICD-10-CM | POA: Diagnosis present

## 2014-10-18 DIAGNOSIS — E538 Deficiency of other specified B group vitamins: Secondary | ICD-10-CM | POA: Diagnosis present

## 2014-10-18 DIAGNOSIS — K219 Gastro-esophageal reflux disease without esophagitis: Secondary | ICD-10-CM

## 2014-10-18 DIAGNOSIS — R32 Unspecified urinary incontinence: Secondary | ICD-10-CM | POA: Diagnosis present

## 2014-10-18 DIAGNOSIS — I10 Essential (primary) hypertension: Secondary | ICD-10-CM | POA: Diagnosis present

## 2014-10-18 DIAGNOSIS — R45851 Suicidal ideations: Secondary | ICD-10-CM | POA: Diagnosis present

## 2014-10-18 DIAGNOSIS — E785 Hyperlipidemia, unspecified: Secondary | ICD-10-CM

## 2014-10-18 DIAGNOSIS — M549 Dorsalgia, unspecified: Secondary | ICD-10-CM | POA: Diagnosis present

## 2014-10-18 DIAGNOSIS — E039 Hypothyroidism, unspecified: Secondary | ICD-10-CM

## 2014-10-18 DIAGNOSIS — M1990 Unspecified osteoarthritis, unspecified site: Secondary | ICD-10-CM | POA: Diagnosis present

## 2014-10-18 DIAGNOSIS — F29 Unspecified psychosis not due to a substance or known physiological condition: Secondary | ICD-10-CM | POA: Diagnosis not present

## 2014-10-18 DIAGNOSIS — F312 Bipolar disorder, current episode manic severe with psychotic features: Secondary | ICD-10-CM | POA: Diagnosis not present

## 2014-10-18 DIAGNOSIS — Z79899 Other long term (current) drug therapy: Secondary | ICD-10-CM | POA: Diagnosis not present

## 2014-10-18 DIAGNOSIS — E119 Type 2 diabetes mellitus without complications: Secondary | ICD-10-CM | POA: Diagnosis present

## 2014-10-18 DIAGNOSIS — D649 Anemia, unspecified: Secondary | ICD-10-CM | POA: Diagnosis present

## 2014-10-18 LAB — LIPID PANEL
CHOL/HDL RATIO: 1.8 ratio
Cholesterol: 116 mg/dL (ref 0–200)
HDL: 63 mg/dL (ref >40)
LDL Cholesterol: 40 mg/dL (ref 0–99)
TRIGLYCERIDES: 67 mg/dL (ref <150)
VLDL: 13 mg/dL (ref 0–40)

## 2014-10-18 LAB — AMMONIA: AMMONIA: 15 umol/L (ref 9–35)

## 2014-10-18 LAB — TSH: TSH: 1.486 u[IU]/mL (ref 0.350–4.500)

## 2014-10-18 LAB — GLUCOSE, CAPILLARY
Glucose-Capillary: 260 mg/dL — ABNORMAL HIGH (ref 65–99)
Glucose-Capillary: 161 mg/dL — ABNORMAL HIGH (ref 65–99)

## 2014-10-18 MED ORDER — VITAMIN D 1000 UNITS PO TABS
2000.0000 [IU] | ORAL_TABLET | Freq: Every day | ORAL | Status: DC
  Administered 2014-10-18 – 2014-10-25 (×8): 2000 [IU] via ORAL
  Filled 2014-10-18 (×10): qty 2

## 2014-10-18 MED ORDER — PANTOPRAZOLE SODIUM 40 MG PO TBEC
40.0000 mg | DELAYED_RELEASE_TABLET | Freq: Every day | ORAL | Status: DC
  Administered 2014-10-18 – 2014-10-19 (×3): 40 mg via ORAL
  Filled 2014-10-18 (×2): qty 1

## 2014-10-18 MED ORDER — MAGNESIUM HYDROXIDE 400 MG/5ML PO SUSP: 30.0000 mL | Freq: Every day | ORAL | Status: DC | PRN

## 2014-10-18 MED ORDER — POLYSACCHARIDE IRON COMPLEX 150 MG PO CAPS
150.0000 mg | ORAL_CAPSULE | Freq: Every day | ORAL | Status: DC
  Administered 2014-10-19 – 2014-10-25 (×7): 150 mg via ORAL
  Filled 2014-10-18 (×8): qty 1

## 2014-10-18 MED ORDER — PRAVASTATIN SODIUM 10 MG PO TABS
40.0000 mg | ORAL_TABLET | Freq: Every day | ORAL | Status: DC
  Administered 2014-10-18 – 2014-10-25 (×8): 40 mg via ORAL
  Filled 2014-10-18 (×8): qty 4

## 2014-10-18 MED ORDER — AMLODIPINE BESYLATE 5 MG PO TABS
2.5000 mg | ORAL_TABLET | Freq: Every day | ORAL | Status: DC
  Administered 2014-10-18: 2.5 mg via ORAL
  Administered 2014-10-19: 5 mg via ORAL
  Filled 2014-10-18 (×2): qty 1

## 2014-10-18 MED ORDER — HALOPERIDOL 0.5 MG PO TABS
0.5000 mg | ORAL_TABLET | Freq: Two times a day (BID) | ORAL | Status: DC
  Administered 2014-10-18 – 2014-10-19 (×2): 0.5 mg via ORAL
  Filled 2014-10-18 (×2): qty 1

## 2014-10-18 MED ORDER — LOPERAMIDE HCL 2 MG PO CAPS
2.0000 mg | ORAL_CAPSULE | ORAL | Status: DC | PRN
  Administered 2014-10-18: 2 mg via ORAL

## 2014-10-18 MED ORDER — ALUM & MAG HYDROXIDE-SIMETH 200-200-20 MG/5ML PO SUSP: 30.0000 mL | ORAL | Status: DC | PRN

## 2014-10-18 MED ORDER — ACETAMINOPHEN 325 MG PO TABS
650.0000 mg | ORAL_TABLET | Freq: Four times a day (QID) | ORAL | Status: DC | PRN
  Administered 2014-10-24: 650 mg via ORAL
  Filled 2014-10-18: qty 2

## 2014-10-18 MED ORDER — LOPERAMIDE HCL 2 MG PO CAPS
ORAL_CAPSULE | ORAL | Status: AC
Start: 2014-10-18 — End: 2014-10-18
  Administered 2014-10-18: 2 mg via ORAL
  Filled 2014-10-18: qty 1

## 2014-10-18 MED ORDER — METFORMIN HCL 500 MG PO TABS
500.0000 mg | ORAL_TABLET | Freq: Two times a day (BID) | ORAL | Status: DC
  Administered 2014-10-18 – 2014-10-25 (×14): 500 mg via ORAL
  Filled 2014-10-18 (×14): qty 1

## 2014-10-18 MED ORDER — VENLAFAXINE HCL ER 75 MG PO CP24
150.0000 mg | ORAL_CAPSULE | Freq: Every day | ORAL | Status: DC
  Administered 2014-10-19 – 2014-10-21 (×3): 150 mg via ORAL
  Filled 2014-10-18 (×3): qty 2

## 2014-10-18 MED ORDER — OXYBUTYNIN CHLORIDE 5 MG PO TABS
5.0000 mg | ORAL_TABLET | Freq: Every day | ORAL | Status: DC
Start: 2014-10-18 — End: 2014-10-18

## 2014-10-18 MED ORDER — VITAMIN B-12 1000 MCG PO TABS
1000.0000 ug | ORAL_TABLET | Freq: Every day | ORAL | Status: DC
  Administered 2014-10-19 – 2014-10-25 (×7): 1000 ug via ORAL
  Filled 2014-10-18 (×8): qty 1

## 2014-10-18 MED ORDER — OXYBUTYNIN CHLORIDE 5 MG PO TABS
5.0000 mg | ORAL_TABLET | Freq: Every day | ORAL | Status: DC
  Administered 2014-10-19 – 2014-10-24 (×6): 5 mg via ORAL
  Filled 2014-10-18 (×6): qty 1

## 2014-10-18 NOTE — ED Notes (Signed)
Patient states she is having diarrhea today and would like medication to help with this.  Md to be notified.

## 2014-10-18 NOTE — ED Notes (Signed)
BEHAVIORAL HEALTH ROUNDING Patient sleeping: Yes.   Patient alert and oriented: not applicable Behavior appropriate: Yes.  ; If no, describe:  Nutrition and fluids offered: Patient sleeping Toileting and hygiene offered: Patient sleeping Sitter present: yes Law enforcement present: Yes   ENVIRONMENTAL ASSESSMENT Potentially harmful objects out of patient reach: Yes.   Personal belongings secured: Yes.   Patient dressed in hospital provided attire only: Yes.   Plastic bags out of patient reach: Yes.   Patient care equipment (cords, cables, call bells, lines, and drains) shortened, removed, or accounted for: Yes.   Equipment and supplies removed from bottom of stretcher: Yes.   Potentially toxic materials out of patient reach: Yes.   Sharps container removed or out of patient reach: Yes.

## 2014-10-18 NOTE — ED Notes (Signed)
BEHAVIORAL HEALTH ROUNDING Patient sleeping: No. Patient alert and oriented: yes Behavior appropriate: Yes.  ; If no, describe:  Nutrition and fluids offered: Yes  Toileting and hygiene offered: Yes  Sitter present: no Law enforcement present: Yes, ODS

## 2014-10-18 NOTE — ED Notes (Signed)
BEHAVIORAL HEALTH ROUNDING Patient sleeping: Yes.   Patient alert and oriented: sleeping Behavior appropriate: Yes.  ; If no, describe: sleeping Nutrition and fluids offered: sleeping Toileting and hygiene offered: sleeping Sitter present: no Law enforcement present: Yes  and ODS 

## 2014-10-18 NOTE — BHH Counselor (Signed)
Pt. is to be admitted to [facility] by Dr. Weber Cooks. Attending Physician will be Dr. Jerilee Hoh.  Pt. has been assigned to room 323, by [place].  Patient is able to transfer to the unit after 1:30pm. Intake Paper Work has been signed and placed on pt. chart. ER staff Holley Raring, ER Sect., Aleen Sells., RN & patient Access [place]) have been made aware of the admission.

## 2014-10-18 NOTE — ED Notes (Signed)
Pt report received from Burman Freestone. Pt transferred to ED accompanied by ED tech and [employer] officer without incident. Pt care assumed. Pt oriented to unit and advised that cameras are present in every room of unit for pt safety. Pt verbalizes understanding. Pt has no further needs or concerns at this time.

## 2014-10-18 NOTE — ED Notes (Signed)
CBG checked after patient had eaten her breakfast.

## 2014-10-18 NOTE — ED Notes (Signed)
BEHAVIORAL HEALTH ROUNDING Patient sleeping: Yes.   Patient alert and oriented: no Behavior appropriate: Yes.  ; If no, describe:  Nutrition and fluids offered: No Toileting and hygiene offered: No Sitter present: no Law enforcement present: Yes, ODS

## 2014-10-18 NOTE — ED Notes (Signed)
Pt resting in bed with eyes closed. No unusual behavior observed. Pt has no needs or concerns at this time. Will continue to monitor and f/u as needed.  

## 2014-10-18 NOTE — ED Notes (Signed)

## 2014-10-18 NOTE — ED Notes (Signed)
BEHAVIORAL HEALTH ROUNDING Patient sleeping: No. Patient alert and oriented: yes Behavior appropriate: Yes.  ; If no, describe:  Nutrition and fluids offered: Yes  Toileting and hygiene offered: Yes  Sitter present: yes Law enforcement present: Yes  

## 2014-10-18 NOTE — Progress Notes (Signed)
D: Pt denies SI/HI/AVH. Pt is pleasant and cooperative. Patient's affect is flat and sad, but brightens upon approach, speech is pressured and hyperverbal, appears less anxious, and is interacting with peers and staff appropriately.  A: Pt was offered support and encouragement. Pt was given scheduled medications. Pt was encouraged to attend groups. Q 15 minute checks were done for safety.  R: Pt attends groups and interacts well with peers and staff. Pt is taking medication. Pt has no complaints.Pt receptive to treatment and safety maintained on unit.

## 2014-10-18 NOTE — Tx Team (Signed)
Initial Interdisciplinary Treatment Plan   PATIENT STRESSORS: Financial difficulties Health problems living alone    PATIENT STRENGTHS: Curator fund of knowledge   PROBLEM LIST: Problem List/Patient Goals Date to be addressed Date deferred Reason deferred Estimated date of resolution  Major depression       Suicidal ideas                                                  DISCHARGE CRITERIA:  Ability to meet basic life and health needs Improved stabilization in mood, thinking, and/or behavior Motivation to continue treatment in a less acute level of care Verbal commitment to aftercare and medication compliance  PRELIMINARY DISCHARGE PLAN: Outpatient therapy  PATIENT/FAMIILY INVOLVEMENT: This treatment plan has been presented to and reviewed with the patient, Erika Jacobs, and/or family member, .  The patient and family have been given the opportunity to ask questions and make suggestions.  Raul Del 10/18/2014, 5:48 PM

## 2014-10-18 NOTE — [facility] Suicide Risk Assessment (Signed)
[facility] Admission Suicide Risk Assessment   Nursing information obtained from:    Demographic factors:    Current Mental Status:    Loss Factors:    Historical Factors:    Risk Reduction Factors:    Total Time spent with patient: 1 hour Principal Problem: Major depressive disorder, recurrent episode, severe, with psychotic behavior Diagnosis:   Patient Active Problem List   Diagnosis Date Noted  . Dyslipidemia [E78.5] 10/18/2014  . Major depressive disorder, recurrent episode, severe, with psychotic behavior [F33.3] 10/18/2014  . GERD (gastroesophageal reflux disease) [K21.9] 10/18/2014  . Hypothyroidism [E03.9] 10/18/2014  . MDD (major depressive disorder), recurrent, severe, with psychosis [F33.3] 10/18/2014  . Diabetes [E11.9] 10/17/2014  . Hypertension [I10] 10/17/2014  . Urinary incontinence [R32] 10/17/2014  . Suicidal ideation [R45.851]   . Cholecystitis with cholelithiasis [K80.10] 06/02/2013     Continued Clinical Symptoms:    The "Alcohol Use Disorders Identification Test", Guidelines for Use in Primary Care, Second Edition.  World Pharmacologist [facility]). Score between 0-7:  no or low risk or alcohol related problems. Score between 8-15:  moderate risk of alcohol related problems. Score between 16-19:  high risk of alcohol related problems. Score 20 or above:  warrants further diagnostic evaluation for alcohol dependence and treatment.   CLINICAL FACTORS:   Severe Anxiety and/or Agitation Depression:   Impulsivity Currently Psychotic  Psychiatric Specialty Exam: Physical Exam  ROS  Blood pressure 134/77, pulse 78, temperature 98 F (36.7 C), temperature source Oral, height 5' (1.524 m), SpO2 99 %.There is no weight on file to calculate BMI.                                                         COGNITIVE FEATURES THAT CONTRIBUTE TO RISK:  Closed-mindedness    SUICIDE RISK:   Moderate:  Frequent suicidal ideation with limited  intensity, and duration, some specificity in terms of plans, no associated intent, good self-control, limited dysphoria/symptomatology, some risk factors present, and identifiable protective factors, including available and accessible social support.  PLAN OF CARE: admit to [place] Making:  Established Problem, Worsening (2)  I certify that inpatient services furnished can reasonably be expected to improve the patient's condition.   Hildred Priest 10/18/2014, 4:54 PM

## 2014-10-18 NOTE — Progress Notes (Addendum)
Patient with depressed affect and cooperative behavior with admission interview and assessment. Skin check performed. Belongings search performed. Staff recommends patient keep all belongings in locker and patient insists are keeping 2 credit/bank cards in her possession. Patient is aware that she assumes responsibility for the cards. Denies SI/HI/AVH at this time. Patient is hyperverbal and limits set with fair result in order to focus on admission. No distress, no discomfort at this time. MD aware of patient request for back brace, cane and walker. Walker provided at this time as patient is placed on [place] protocol. Safety maintained. No contraband found.

## 2014-10-18 NOTE — H&P (Addendum)
Psychiatric Admission Assessment Adult  Patient Identification: Erika Jacobs MRN:  [id number] Date of Evaluation:  10/18/2014 Chief Complaint:  Major Depression Principal Diagnosis: Major depressive disorder, recurrent episode, severe, with psychotic behavior Diagnosis:   Patient Active Problem List   Diagnosis Date Noted  . Dyslipidemia [E78.5] 10/18/2014  . Major depressive disorder, recurrent episode, severe, with psychotic behavior [F33.3] 10/18/2014  . GERD (gastroesophageal reflux disease) [K21.9] 10/18/2014  . Hypothyroidism [E03.9] 10/18/2014  . Diabetes [E11.9] 10/17/2014  . Hypertension [I10] 10/17/2014  . Urinary incontinence [R32] 10/17/2014  . Suicidal ideation [R45.851]   . Cholecystitis with cholelithiasis [K80.10] 06/02/2013   History of Present Illness: Erika Jacobs is a 64 y.o. female who complains of stress and anxiety. She states that she came to the ER today because she was referred here by Erika Jacobs. Review of the records reveals that the patient was in the Erika Jacobs ER on June Jacobs, and they plan to admit her for psychiatric stabilization with psychosis. However, the patient had a very good response to risperidoneand was discharged home with instructions to follow-up with Erika Jacobs Erika Jacobs. She notes that she is had ongoing racing thoughts and paranoia as she usually does. She also had a brief time period of suicidal thoughts this morning without any plans.   She tells me that her mood is very depressed and sad. She thinks it's been that way for years. She can't specify how long it's been getting worse. She says she has terrible stress on her but when I asked what it was all she can tell me is that her children bother her. She is sleeping poorly at night. Not eating. Says that she hears voices. She will not specify anything about what they actually say. All she says is "all kinds of things". She states that she is having suicidal ideation.  She does not state any specific plan.   Patient during the interview reported that she felt the physicians in the emergency department were mocking her and were performing an [place] dance. She is stated that she became suicidal because nobody was helping her in the emergency department and she thinks they were doing that on purpose to push her to her limits.   Patient disliked the Risperdal as she reports she started having nightmares with this medication and she does not want to continue with.  She thinks that she's been giving medications that are causing severe side effects such as kidney damage when there is really no evidence of such things.   Substance abuse history: Denies any history of alcohol abuse or drug abuse. None documented.  Current medication: Psychiatrically she is prescribed Risperdal M tabs a half milligram at night and Effexor extended release 75 mg a day.   HPI Elements: Quality: Depression suicidal thoughts and hallucinations. Severity: Severe and potentially life threatening. Timing: Evidently have been going on for years but worse just in the last few weeks. Duration: Chronic but worsening recently and up and down from day to day. Context: Unknown what exactly might be causing this worsening.  Past psychiatric history: No known history of past psychiatric hospitalization. She had been treated with Effexor by a primary care doctor. Denies any history of suicide attempts. These psychotic symptoms appear to be a new complaint. Patient however states that her symptoms of been present for years.  Past Medical History: Diabetes, high blood pressure, elevated cholesterol, history of urinary incontinence, history of gallstones Past Medical History  Diagnosis Date  .  Diabetes mellitus without complication   . Panic attack   . Hypertension   . Hypercholesteremia   . GERD (gastroesophageal reflux disease)   . Hypothyroid   . Overactive bladder   . Arthritis   .  Anemia   . Shortness of breath     Past Surgical History  Procedure Laterality Date  . Tubal ligation    . Eye surgery Left   . Colonoscopy with esophagogastroduodenoscopy (egd) N/A 05/20/2013    Procedure: COLONOSCOPY WITH ESOPHAGOGASTRODUODENOSCOPY (EGD);  Surgeon: Rogene Houston, MD;  Location: [facility];  Service: Endoscopy;  Laterality: N/A;  730  . Cholecystectomy N/A 06/02/2013    Procedure: LAPAROSCOPIC CHOLECYSTECTOMY;  Surgeon: Scherry Ran, MD;  Location: [facility] ORS;  Service: General;  Laterality: N/A;   Family History: No family history on file. per son there is extensive family history of mental illness.  Social History: Patient tells me that she lives by herself. Son says she lives alone in [place].   Patient was incarcerated in [place] with some charges related to gun trafficking.  History  Alcohol Use No     History  Drug Use No    History   Social History  . Marital Status: Divorced    Spouse Name: N/A  . Number of Children: N/A  . Years of Education: N/A   Social History Main Topics  . Smoking status: Former Smoker -- 2.00 packs/day for 30 years    Types: Cigarettes    Quit date: 05/20/2002  . Smokeless tobacco: Not on file  . Alcohol Use: No  . Drug Use: No  . Sexual Activity: Yes    Birth Control/ Protection: Post-menopausal   Other Topics Concern  . Not on file   Social History Narrative    Musculoskeletal: Strength & Muscle Tone: within normal limits Gait & Station: normal Patient leans: N/A  Psychiatric Specialty Exam: Physical Exam  ROS  There were no vitals taken for this visit.There is no weight on file to calculate BMI.  General Appearance: Well Groomed  Eye Contact::  Good  Speech:  Pressured  Volume:  Normal  Mood:  Anxious  Affect:  Congruent  Thought Process:  Tangential  Orientation:  Full (Time, Jacobs, and Person)  Thought Content:  Hallucinations: None  Suicidal Thoughts:  No  Homicidal Thoughts:  No  Memory:   Immediate;   Fair Recent;   Fair Remote;   Fair  Judgement:  Fair  Insight:  Fair  Psychomotor Activity:  Increased  Concentration:  Fair  Recall:  NA  Fund of Knowledge:Good  Language: Good  Akathisia:  No  Handed:    AIMS (if indicated):     Assets:    ADL's:  Intact  Cognition: WNL  Sleep:      Physical examination completed in our emergency department. Constitutional: Alert and oriented. Well appearing and in no distress. Eyes: No scleral icterus. No conjunctival pallor. PERRL. EOMI ENT   Head: Normocephalic and atraumatic.   Nose: No congestion/rhinnorhea. No septal hematoma   Mouth/Throat: MMM, no pharyngeal erythema. No peritonsillar mass. No uvula shift.   Neck: No stridor. No SubQ emphysema. No meningismus. Hematological/Lymphatic/Immunilogical: No cervical lymphadenopathy. Cardiovascular: RRR. Normal and symmetric distal pulses are present in all extremities. No murmurs, rubs, or gallops. Respiratory: Normal respiratory effort without tachypnea nor retractions. Breath sounds are clear and equal bilaterally. No wheezes/rales/rhonchi. Gastrointestinal: Soft and nontender. No distention. There is no CVA tenderness. No rebound, rigidity, or guarding. Genitourinary: deferred Musculoskeletal: Nontender  with normal range of motion in all extremities. No joint effusions. No lower extremity tenderness. No edema. Neurologic:  Normal speech and language.  CN 2-10 normal. Motor grossly intact. No pronator drift. Normal gait. No gross focal neurologic deficits are appreciated.  Skin: Skin is warm, dry and intact. No rash noted. No petechiae, purpura, or bullae. Psychiatric: Anxious affect, has trouble giving clear appropriate answers to questioning about sequence of events and reason for presentation.  Allergies:  No Known Allergies   Lab Results:  Results for orders placed or performed during the hospital encounter of 10/17/14 (from the past 48 hour(s))   CBC     Status: Abnormal   Collection Time: 10/17/14  9:Jacobs AM  Result Value Ref Range   WBC 7.5 3.6 - 11.0 K/uL   RBC 4.43 3.80 - 5.20 MIL/uL   Hemoglobin 11.4 (L) 12.0 - 16.0 g/dL   HCT 35.9 35.0 - 47.0 %   MCV 81.0 80.0 - 100.0 fL   MCH Jacobs.7 (L) 26.0 - 34.0 pg   MCHC 31.7 (L) 32.0 - 36.0 g/dL   RDW 16.1 (H) 11.5 - 14.5 %   Platelets 198 150 - 440 K/uL  Comprehensive metabolic panel     Status: Abnormal   Collection Time: 10/17/14  9:Jacobs AM  Result Value Ref Range   Sodium 138 135 - 145 mmol/L   Potassium 4.3 3.5 - 5.1 mmol/L   Chloride 106 101 - 111 mmol/L   CO2 23 22 - 32 mmol/L   Glucose, Bld 198 (H) 65 - 99 mg/dL   BUN 47 (H) 6 - 20 mg/dL   Creatinine, Ser 1.49 (H) 0.44 - 1.00 mg/dL   Calcium 9.8 8.9 - 10.3 mg/dL   Total Protein 7.8 6.5 - 8.1 g/dL   Albumin 4.2 3.5 - 5.0 g/dL   AST 37 15 - 41 U/L   ALT 31 14 - 54 U/L   Alkaline Phosphatase 67 38 - 126 U/L   Total Bilirubin 0.3 0.3 - 1.2 mg/dL   GFR calc non Af Amer 37 (L) >60 mL/min   GFR calc Af Amer 43 (L) >60 mL/min    Comment: (NOTE) The eGFR has been calculated using the CKD EPI equation. This calculation has not been validated in all clinical situations. eGFR's persistently <60 mL/min signify possible Chronic Kidney Disease.    Anion gap 9 5 - 15  Urine Drug Screen, Qualitative ([facility] only)     Status: Abnormal   Collection Time: 10/17/14  9:Jacobs AM  Result Value Ref Range   Tricyclic, Ur Screen NONE DETECTED NONE DETECTED   Amphetamines, Ur Screen NONE DETECTED NONE DETECTED   MDMA (Ecstasy)Ur Screen NONE DETECTED NONE DETECTED   Cocaine Metabolite,Ur [place] NONE DETECTED NONE DETECTED   Opiate, Ur Screen NONE DETECTED NONE DETECTED   Phencyclidine (PCP) Ur S NONE DETECTED NONE DETECTED   Cannabinoid 50 Ng, Ur [place] NONE DETECTED NONE DETECTED   Barbiturates, Ur Screen NONE DETECTED NONE DETECTED   Benzodiazepine, Ur Scrn POSITIVE (A) NONE DETECTED   Methadone Scn, Ur NONE DETECTED NONE DETECTED    Comment:  (NOTE) 992  Tricyclics, urine               Cutoff 1000 ng/mL 200  Amphetamines, urine             Cutoff 1000 ng/mL 300  MDMA (Ecstasy), urine           Cutoff 500 ng/mL 400  Cocaine Metabolite, urine  Cutoff 300 ng/mL 500  Opiate, urine                   Cutoff 300 ng/mL 600  Phencyclidine (PCP), urine      Cutoff Jacobs ng/mL 700  Cannabinoid, urine              Cutoff 50 ng/mL 800  Barbiturates, urine             Cutoff 200 ng/mL 900  Benzodiazepine, urine           Cutoff 200 ng/mL 1000 Methadone, urine                Cutoff 300 ng/mL 1100 1200 The urine drug screen provides only a preliminary, unconfirmed 1300 analytical test result and should not be used for non-medical 1400 purposes. Clinical consideration and professional judgment should 1500 be applied to any positive drug screen result due to possible 1600 interfering substances. A more specific alternate chemical method 1700 must be used in order to obtain a confirmed analytical result.  1800 Gas chromato graphy / mass spectrometry (GC/MS) is the preferred 1900 confirmatory method.   Glucose, capillary     Status: None   Collection Time: 10/17/14  6:05 PM  Result Value Ref Range   Glucose-Capillary 77 65 - 99 mg/dL  Glucose, capillary     Status: None   Collection Time: 10/17/14 10:48 PM  Result Value Ref Range   Glucose-Capillary 76 65 - 99 mg/dL  Glucose, capillary     Status: Abnormal   Collection Time: 10/18/14  9:17 AM  Result Value Ref Range   Glucose-Capillary 260 (H) 65 - 99 mg/dL  Glucose, capillary     Status: Abnormal   Collection Time: 10/18/14 11:30 AM  Result Value Ref Range   Glucose-Capillary 161 (H) 65 - 99 mg/dL   Current Medications: No current facility-administered medications for this encounter.   PTA Medications: Prescriptions prior to admission  Medication Sig Dispense Refill Last Dose  . amLODipine (NORVASC) 2.5 MG tablet Take 2.5 mg by mouth daily.   10/16/2014 at Unknown time  .  Ascorbic Acid (VITAMIN C) 1000 MG tablet Take 1,000 mg by mouth daily.   10/16/2014 at Unknown time  . Cholecalciferol (VITAMIN D) 2000 UNITS CAPS Take 2,000 Units by mouth daily.    10/16/2014 at Unknown time  . docusate sodium 100 MG CAPS Take 100 mg by mouth 2 (two) times daily. (Patient taking differently: Take 100 mg by mouth daily. ) 10 capsule 0 10/16/2014 at Unknown time  . iron polysaccharides (NIFEREX) 150 MG capsule Take 150 mg by mouth daily.   10/16/2014 at Unknown time  . metFORMIN (GLUCOPHAGE) 500 MG tablet Take 500 mg by mouth 2 (two) times daily with a meal.    10/16/2014 at Unknown time  . naproxen (NAPROSYN) 375 MG tablet Take 375 mg by mouth 2 (two) times daily as needed for mild pain.    Past Week at Unknown time  . omeprazole (PRILOSEC) 20 MG capsule Take 20 mg by mouth daily.   10/16/2014 at Unknown time  . oxybutynin (DITROPAN) 5 MG tablet Take 5 mg by mouth daily.   10/16/2014 at Unknown time  . pravastatin (PRAVACHOL) 40 MG tablet Take 40 mg by mouth daily.   10/16/2014 at Unknown time  . risperiDONE (RISPERDAL M-TABS) 0.5 MG disintegrating tablet Take 1 tablet (0.5 mg total) by mouth at bedtime. 30 tablet 0 10/16/2014 at Unknown time  . venlafaxine XR (  EFFEXOR-XR) 75 MG 24 hr capsule Take 1 capsule (75 mg total) by mouth daily with breakfast. 30 capsule 0 10/16/2014 at Unknown time  . vitamin B-12 (CYANOCOBALAMIN) 1000 MCG tablet Take 1,000 mcg by mouth daily.   10/16/2014 at Unknown time      Results for orders placed or performed during the hospital encounter of 10/17/14 (from the past 72 hour(s))  CBC     Status: Abnormal   Collection Time: 10/17/14  9:Jacobs AM  Result Value Ref Range   WBC 7.5 3.6 - 11.0 K/uL   RBC 4.43 3.80 - 5.20 MIL/uL   Hemoglobin 11.4 (L) 12.0 - 16.0 g/dL   HCT 35.9 35.0 - 47.0 %   MCV 81.0 80.0 - 100.0 fL   MCH Jacobs.7 (L) 26.0 - 34.0 pg   MCHC 31.7 (L) 32.0 - 36.0 g/dL   RDW 16.1 (H) 11.5 - 14.5 %   Platelets 198 150 - 440 K/uL  Comprehensive  metabolic panel     Status: Abnormal   Collection Time: 10/17/14  9:Jacobs AM  Result Value Ref Range   Sodium 138 135 - 145 mmol/L   Potassium 4.3 3.5 - 5.1 mmol/L   Chloride 106 101 - 111 mmol/L   CO2 23 22 - 32 mmol/L   Glucose, Bld 198 (H) 65 - 99 mg/dL   BUN 47 (H) 6 - 20 mg/dL   Creatinine, Ser 1.49 (H) 0.44 - 1.00 mg/dL   Calcium 9.8 8.9 - 10.3 mg/dL   Total Protein 7.8 6.5 - 8.1 g/dL   Albumin 4.2 3.5 - 5.0 g/dL   AST 37 15 - 41 U/L   ALT 31 14 - 54 U/L   Alkaline Phosphatase 67 38 - 126 U/L   Total Bilirubin 0.3 0.3 - 1.2 mg/dL   GFR calc non Af Amer 37 (L) >60 mL/min   GFR calc Af Amer 43 (L) >60 mL/min    Comment: (NOTE) The eGFR has been calculated using the CKD EPI equation. This calculation has not been validated in all clinical situations. eGFR's persistently <60 mL/min signify possible Chronic Kidney Disease.    Anion gap 9 5 - 15  Urine Drug Screen, Qualitative ([facility] only)     Status: Abnormal   Collection Time: 10/17/14  9:Jacobs AM  Result Value Ref Range   Tricyclic, Ur Screen NONE DETECTED NONE DETECTED   Amphetamines, Ur Screen NONE DETECTED NONE DETECTED   MDMA (Ecstasy)Ur Screen NONE DETECTED NONE DETECTED   Cocaine Metabolite,Ur [place] NONE DETECTED NONE DETECTED   Opiate, Ur Screen NONE DETECTED NONE DETECTED   Phencyclidine (PCP) Ur S NONE DETECTED NONE DETECTED   Cannabinoid 50 Ng, Ur [place] NONE DETECTED NONE DETECTED   Barbiturates, Ur Screen NONE DETECTED NONE DETECTED   Benzodiazepine, Ur Scrn POSITIVE (A) NONE DETECTED   Methadone Scn, Ur NONE DETECTED NONE DETECTED    Comment: (NOTE) 734  Tricyclics, urine               Cutoff 1000 ng/mL 200  Amphetamines, urine             Cutoff 1000 ng/mL 300  MDMA (Ecstasy), urine           Cutoff 500 ng/mL 400  Cocaine Metabolite, urine       Cutoff 300 ng/mL 500  Opiate, urine                   Cutoff 300 ng/mL 600  Phencyclidine (PCP), urine  Cutoff Jacobs ng/mL 700  Cannabinoid, urine              Cutoff 50  ng/mL 800  Barbiturates, urine             Cutoff 200 ng/mL 900  Benzodiazepine, urine           Cutoff 200 ng/mL 1000 Methadone, urine                Cutoff 300 ng/mL 1100 1200 The urine drug screen provides only a preliminary, unconfirmed 1300 analytical test result and should not be used for non-medical 1400 purposes. Clinical consideration and professional judgment should 1500 be applied to any positive drug screen result due to possible 1600 interfering substances. A more specific alternate chemical method 1700 must be used in order to obtain a confirmed analytical result.  1800 Gas chromato graphy / mass spectrometry (GC/MS) is the preferred 1900 confirmatory method.   Glucose, capillary     Status: None   Collection Time: 10/17/14  6:05 PM  Result Value Ref Range   Glucose-Capillary 77 65 - 99 mg/dL  Glucose, capillary     Status: None   Collection Time: 10/17/14 10:48 PM  Result Value Ref Range   Glucose-Capillary 76 65 - 99 mg/dL  Glucose, capillary     Status: Abnormal   Collection Time: 10/18/14  9:17 AM  Result Value Ref Range   Glucose-Capillary 260 (H) 65 - 99 mg/dL  Glucose, capillary     Status: Abnormal   Collection Time: 10/18/14 11:30 AM  Result Value Ref Range   Glucose-Capillary 161 (H) 65 - 99 mg/dL     Treatment Plan Summary: Daily contact with patient to assess and evaluate symptoms and progress in treatment and Medication management   64 year old Caucasian female with history of depression. Patient presents to the hospital with a possibly new onset paranoia and suicidal ideation. The patient has been in 3 different hospitals over the last couple of weeks, [facility] and 2 days ago [facility]. Per family pt has always been paranoid but lately has become much worse and patient is not able to function.  Patient  has made clear persecutory statements to the family much more intense and severe than in the past.  Family worries about  the amount of medication patient is taking.  They also wonder about the possibility of dementia.  MDD with psychotic features: will continue effexor XR but will increase dose to 150 mg po q day.  For psychosis pt will be started on haldol 0.5 mg po bid.  Patient is displaying in tangential thought processes and her speech is somewhat pressured. Bipolar disorder also needs to be considered as part of the working diagnosis.  HTN: continue norvas 2.5 mg po q day  Dyslipidemia: continue pravachol 40 mg po q day  DM: continue metformin 500 mg po bid  Vit B12 def: will check levels today.  Continue b12 1000 mcg /day  Vit D def: continue vit D 2000 U q day  Anemia: continue Fe So4  150 mg po q day  Urinary incontinence: continue ditropan 5 mg po qhs  GERD: continue PPI q day  Back pain: will consult PT as pt uses a back brace at home.  Collateral info: will contact pt's family 51 [phone] (son) and [phone] Brother  Labs: HIV, RPR, Vit B12, Ammonia, TSH, lipid panel and HbA1c.  Medical Decision Making:  Established Problem, Worsening (2)    >  90 minutes.  Chart review and coordination of care.  Collateral info obtained from pt's son.  I certify that inpatient services furnished can reasonably be expected to improve the patient's condition.   Hildred Priest 6/29/20164:10 PM

## 2014-10-18 NOTE — ED Notes (Signed)
ED BHU [place] Is the patient under IVC or is there intent for IVC: Yes.   Is the patient medically cleared: Yes.   Is there vacancy in the ED BHU: Yes.   Is the population mix appropriate for patient: Yes.   Is the patient awaiting placement in inpatient or outpatient setting: Yes.   Has the patient had a psychiatric consult: Yes.   Survey of unit performed for contraband, proper placement and condition of furniture, tampering with fixtures in bathroom, shower, and each patient room: Yes.  ; Findings:  APPEARANCE/BEHAVIOR cooperative NEURO ASSESSMENT Orientation: AAO x 3 Hallucinations: No.None noted (Hallucinations) Speech: Normal Gait: normal RESPIRATORY ASSESSMENT wnl CARDIOVASCULAR ASSESSMENT regular rate and rhythm, S1, S2 normal, no murmur, click, rub or gallop and wnl GASTROINTESTINAL ASSESSMENT wnl EXTREMITIES wnl PLAN OF CARE Provide calm/safe environment. Vital signs assessed twice daily. ED BHU Assessment once each 12-hour shift. Collaborate with intake RN daily or as condition indicates. Assure the ED provider has rounded once each shift. Provide and encourage hygiene. Provide redirection as needed. Assess for escalating behavior; address immediately and inform ED provider.  Assess family dynamic and appropriateness for visitation as needed: Yes.  ; If necessary, describe findings:  Educate the patient/family about BHU procedures/visitation: Yes.  ; If necessary, describe findings:

## 2014-10-18 NOTE — Plan of Care (Signed)
Problem: Alteration in mood Goal: LTG-Patient reports reduction in suicidal thoughts (Patient reports reduction in suicidal thoughts and is able to verbalize a safety plan for whenever patient is feeling suicidal)  Outcome: Progressing Patient denies SI/HI and also contracts for safety.  Goal: LTG-Pt's behavior demonstrates decreased signs of depression (Patient's behavior demonstrates decreased signs of depression to the point the patient is safe to return home and continue treatment in an outpatient setting)  Outcome: Progressing Interacting with peers and attending unit programs.

## 2014-10-18 NOTE — ED Notes (Signed)
BEHAVIORAL HEALTH ROUNDING Patient sleeping: Yes.   Patient alert and oriented: not applicable Behavior appropriate: Yes.  ; If no, describe:  Nutrition and fluids offered: Patient sleeping Toileting and hygiene offered: Patient sleeping Sitter present: yes Law enforcement present: Yes

## 2014-10-18 NOTE — ED Notes (Signed)
Patient showered.

## 2014-10-18 NOTE — ED Notes (Signed)
BEHAVIORAL HEALTH ROUNDING Patient sleeping: Yes.   Patient alert and oriented: no Behavior appropriate: Yes.  ; If no, describe:  Nutrition and fluids offered: Yes  Toileting and hygiene offered: Yes  Sitter present: no Law enforcement present: Yes, ODS

## 2014-10-19 ENCOUNTER — Inpatient Hospital Stay: Payer: Medicare Other

## 2014-10-19 LAB — VITAMIN B12: VITAMIN B 12: 741 pg/mL (ref 180–914)

## 2014-10-19 LAB — GLUCOSE, CAPILLARY
GLUCOSE-CAPILLARY: 250 mg/dL — AB (ref 65–99)
GLUCOSE-CAPILLARY: 157 mg/dL — AB (ref 65–99)

## 2014-10-19 LAB — HEMOGLOBIN A1C: Hgb A1c MFr Bld: 10.9 % — ABNORMAL HIGH (ref 4.0–6.0)

## 2014-10-19 MED ORDER — WHITE PETROLATUM GEL: Status: DC | PRN

## 2014-10-19 MED ORDER — INSULIN ASPART 100 UNIT/ML ~~LOC~~ SOLN
0.0000 [IU] | Freq: Three times a day (TID) | SUBCUTANEOUS | Status: DC
  Administered 2014-10-19: 3 [IU] via SUBCUTANEOUS
  Administered 2014-10-20: 2 [IU] via SUBCUTANEOUS
  Administered 2014-10-20: 1 [IU] via SUBCUTANEOUS
  Administered 2014-10-21: 100 [IU] via SUBCUTANEOUS
  Administered 2014-10-22 – 2014-10-23 (×2): 1 [IU] via SUBCUTANEOUS
  Administered 2014-10-24: 100 [IU] via SUBCUTANEOUS
  Administered 2014-10-24: 1 [IU] via SUBCUTANEOUS
  Administered 2014-10-24: 100 [IU] via SUBCUTANEOUS
  Administered 2014-10-25: 2 [IU] via SUBCUTANEOUS
  Filled 2014-10-19 (×3): qty 1
  Filled 2014-10-19: qty 3
  Filled 2014-10-19: qty 2
  Filled 2014-10-19 (×3): qty 1
  Filled 2014-10-19: qty 2
  Filled 2014-10-19: qty 1

## 2014-10-19 MED ORDER — GADOBENATE DIMEGLUMINE 529 MG/ML IV SOLN
10.0000 mL | Freq: Once | INTRAVENOUS | Status: AC
  Administered 2014-10-19: 9 mL via INTRAVENOUS

## 2014-10-19 MED ORDER — PANTOPRAZOLE SODIUM 40 MG PO TBEC
40.0000 mg | DELAYED_RELEASE_TABLET | Freq: Every day | ORAL | Status: DC
  Administered 2014-10-20 – 2014-10-25 (×6): 40 mg via ORAL
  Filled 2014-10-19 (×6): qty 1

## 2014-10-19 MED ORDER — AMLODIPINE BESYLATE 5 MG PO TABS
5.0000 mg | ORAL_TABLET | Freq: Every day | ORAL | Status: DC
  Administered 2014-10-20 – 2014-10-25 (×6): 5 mg via ORAL
  Filled 2014-10-19 (×6): qty 1

## 2014-10-19 MED ORDER — ARIPIPRAZOLE 10 MG PO TABS
10.0000 mg | ORAL_TABLET | Freq: Every day | ORAL | Status: DC
  Administered 2014-10-20: 10 mg via ORAL
  Filled 2014-10-19: qty 1

## 2014-10-19 MED ORDER — INSULIN GLARGINE 100 UNIT/ML ~~LOC~~ SOLN
9.0000 [IU] | Freq: Every day | SUBCUTANEOUS | Status: DC
  Administered 2014-10-19 – 2014-10-24 (×6): 9 [IU] via SUBCUTANEOUS
  Filled 2014-10-19 (×7): qty 0.09

## 2014-10-19 MED ORDER — WHITE PETROLATUM GEL
Status: DC | PRN
  Filled 2014-10-19: qty 5

## 2014-10-19 NOTE — BHH Group Notes (Signed)
[place] Group Notes:  (Nursing/MHT/Case Management/Adjunct)  Date:  10/19/2014  Time:  11:54 PM  Type of Therapy:  Evening Wrap-up Group  Participation Level:  Did Not Attend  Participation Quality:  N/A  Affect:  N/A  Cognitive:  N/A  Insight:  None  Engagement in Group:  None  Modes of Intervention:  Activity  Summary of Progress/Problems:  Levonne Spiller 10/19/2014, 11:54 PM

## 2014-10-19 NOTE — Plan of Care (Signed)
Problem: Alteration in mood Goal: LTG-Patient reports reduction in suicidal thoughts (Patient reports reduction in suicidal thoughts and is able to verbalize a safety plan for whenever patient is feeling suicidal)  Outcome: Progressing Denies SI Goal: LTG-Pt's behavior demonstrates decreased signs of depression (Patient's behavior demonstrates decreased signs of depression to the point the patient is safe to return home and continue treatment in an outpatient setting)  Outcome: Not Progressing Rates depression as a 9/10

## 2014-10-19 NOTE — Progress Notes (Signed)
Presents with sad affect.  Denies SI. Endorses SI.  Rates depression as a 9/10.  Medication and group compliant.  Uses walker for ambulation without difficult.  Safety maintained
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
## 2014-10-19 NOTE — BHH Group Notes (Signed)
[place] Group Notes:  (Nursing/MHT/Case Management/Adjunct)  Date:  10/19/2014  Time:  2:02PM Type of Therapy:  Group Therapy  Participation Level:  Active  Participation Quality:  Sharing and Supportive  Affect:  Appropriate  Cognitive:  Disorganized  Insight:  Appropriate  Engagement in Group:  Engaged  Modes of Intervention:  Activity  Summary of Progress/Problems:  Erika Jacobs Erika Jacobs 10/19/2014, 5:24 PM

## 2014-10-19 NOTE — Progress Notes (Signed)
Inpatient Diabetes Program Recommendations  AACE/ADA: New Consensus Statement on Inpatient Glycemic Control (2013)  Target Ranges:  Prepandial:   less than 140 mg/dL      Peak postprandial:   less than 180 mg/dL (1-2 hours)      Critically ill patients:  140 - 180 mg/dL   Reason for assessment: elevated A1C of 10.9%  Diabetes history: Type 2 Outpatient Diabetes medications: Metformin 500mg  bid- not taking as ordered (per note) Current orders for Inpatient glycemic control: Metformin 500mg  bid  Optimal- consider starting the patient on Lantus insulin 9 units qday (0.1unit/kg).  Check CBG tid and hs and start Novolog sensitive correction 0-9 units tid and 0-5 units qhs. D/C home on this if the patient will take it and if the patient can take it safely.  If insulin is not an option at home, consider increasing Metformin to 1000mg  bid and start Glipizide 10mg  q day.  Patient should eat three meals/day to prevent low blood sugars.   Gentry Fitz, RN, BA, MHA, CDE Diabetes Coordinator Inpatient Diabetes Program  [phone] (Team Pager) [phone] ([place]) 10/19/2014 12:29 PM

## 2014-10-19 NOTE — Progress Notes (Signed)
[facility] MD Progress Note  10/19/2014 12:52 PM BRIDGETTE NECOCHEA  MRN:  [id number] Subjective:  Patient denies SI, HI or having auditory or visual hallucinations. Patient denies major problems with sleep, appetite, energy or concentration. Patient denies side effects from medications. Patient denies having major physical complaints today.  Per nursing: Pt denies SI/HI/AVH. Pt is pleasant and cooperative. Patient's affect is flat and sad, but brightens upon approach, speech is pressured and hyperverbal, appears less anxious, and is interacting with peers and staff appropriately.   Principal Problem: Major depressive disorder, recurrent episode, severe, with psychotic behavior Diagnosis:   Patient Active Problem List   Diagnosis Date Noted  . Dyslipidemia [E78.5] 10/18/2014  . Major depressive disorder, recurrent episode, severe, with psychotic behavior [F33.3] 10/18/2014  . GERD (gastroesophageal reflux disease) [K21.9] 10/18/2014  . Hypothyroidism [E03.9] 10/18/2014  . MDD (major depressive disorder), recurrent, severe, with psychosis [F33.3] 10/18/2014  . Major depression [F32.2] 10/18/2014  . Diabetes [E11.9] 10/17/2014  . Hypertension [I10] 10/17/2014  . Urinary incontinence [R32] 10/17/2014  . Suicidal ideation [R45.851]   . Cholecystitis with cholelithiasis [K80.10] 06/02/2013   Total Time spent with patient: 30 minutes   Past Medical History:  Past Medical History  Diagnosis Date  . Diabetes mellitus without complication   . Panic attack   . Hypertension   . Hypercholesteremia   . GERD (gastroesophageal reflux disease)   . Hypothyroid   . Overactive bladder   . Arthritis   . Anemia   . Shortness of breath     Past Surgical History  Procedure Laterality Date  . Tubal ligation    . Eye surgery Left   . Colonoscopy with esophagogastroduodenoscopy (egd) N/A 05/20/2013    Procedure: COLONOSCOPY WITH ESOPHAGOGASTRODUODENOSCOPY (EGD);  Surgeon: Rogene Houston, MD;  Location:  [facility];  Service: Endoscopy;  Laterality: N/A;  730  . Cholecystectomy N/A 06/02/2013    Procedure: LAPAROSCOPIC CHOLECYSTECTOMY;  Surgeon: Scherry Ran, MD;  Location: [facility] ORS;  Service: General;  Laterality: N/A;   Family History: History reviewed. No pertinent family history. Social History:  History  Alcohol Use No     History  Drug Use No    History   Social History  . Marital Status: Divorced    Spouse Name: N/A  . Number of Children: N/A  . Years of Education: N/A   Social History Main Topics  . Smoking status: Former Smoker -- 2.00 packs/day for 30 years    Types: Cigarettes    Quit date: 05/20/2002  . Smokeless tobacco: Not on file  . Alcohol Use: No  . Drug Use: No  . Sexual Activity: Yes    Birth Control/ Protection: Post-menopausal   Other Topics Concern  . None   Social History Narrative   Additional History:    Sleep: Good  Appetite:  Good   Assessment:   Musculoskeletal: Strength & Muscle Tone: within normal limits Gait & Station: normal Patient leans: N/A   Psychiatric Specialty Exam: Physical Exam  Review of Systems  Constitutional: Negative.   HENT: Negative.   Eyes: Negative.   Respiratory: Negative.   Cardiovascular: Negative.   Gastrointestinal: Negative.   Genitourinary: Negative.   Musculoskeletal: Negative.   Skin: Negative.   Neurological: Negative.   Endo/Heme/Allergies: Negative.   Psychiatric/Behavioral: Negative.     Blood pressure 156/76, pulse 98, temperature 98.6 F (37 C), temperature source Oral, resp. rate 18, height 5' (1.524 m), weight 86.183 kg (190 lb), SpO2 99 %.Body mass index  is 37.11 kg/(m^2).  General Appearance: Well Groomed  Engineer, water::  Good  Speech:  Clear and Coherent  Volume:  Normal  Mood:  Euthymic  Affect:  Congruent  Thought Process:  Linear  Orientation:  Full (Time, Place, and Person)  Thought Content:  Hallucinations: None  Suicidal Thoughts:  No  Homicidal Thoughts:  No   Memory:  Immediate;   Good Recent;   Good Remote;   Good  Judgement:  Fair  Insight:  Fair  Psychomotor Activity:  Normal  Concentration:  Good  Recall:  NA  Fund of Knowledge:Good  Language: Good  Akathisia:  No  Handed:    AIMS (if indicated):     Assets:  Communication Skills Desire for Improvement Financial Resources/Insurance Housing Social Support  ADL's:  Intact  Cognition: WNL  Sleep:  Number of Hours: 1.5     Current Medications: Current Facility-Administered Medications  Medication Dose Route Frequency Provider Last Rate Last Dose  . acetaminophen (TYLENOL) tablet 650 mg  650 mg Oral Q6H PRN Hildred Priest, MD      . alum & mag hydroxide-simeth (MAALOX/MYLANTA) 200-200-20 MG/5ML suspension 30 mL  30 mL Oral Q4H PRN Hildred Priest, MD      . amLODipine (NORVASC) tablet 2.5 mg  2.5 mg Oral Daily Hildred Priest, MD   5 mg at 10/19/14 [id number]  . cholecalciferol (VITAMIN D) tablet 2,000 Units  2,000 Units Oral Daily Hildred Priest, MD   2,000 Units at 10/19/14 0925  . haloperidol (HALDOL) tablet 0.5 mg  0.5 mg Oral BID Hildred Priest, MD   0.5 mg at 10/19/14 [id number]  . insulin aspart (novoLOG) injection 0-9 Units  0-9 Units Subcutaneous TID WC Hildred Priest, MD      . insulin glargine (LANTUS) injection 9 Units  9 Units Subcutaneous QHS Hildred Priest, MD      . iron polysaccharides (NIFEREX) capsule 150 mg  150 mg Oral Daily Hildred Priest, MD   150 mg at 10/19/14 [id number]  . magnesium hydroxide (MILK OF MAGNESIA) suspension 30 mL  30 mL Oral Daily PRN Hildred Priest, MD      . metFORMIN (GLUCOPHAGE) tablet 500 mg  500 mg Oral BID WC Hildred Priest, MD   500 mg at 10/19/14 [id number]  . oxybutynin (DITROPAN) tablet 5 mg  5 mg Oral QHS Hildred Priest, MD      . Derrill Memo ON 10/20/2014] pantoprazole (PROTONIX) EC tablet 40 mg  40 mg Oral QAC breakfast Hildred Priest, MD      . pravastatin (PRAVACHOL) tablet 40 mg  40 mg Oral Daily Hildred Priest, MD   40 mg at 10/19/14 0925  . venlafaxine XR (EFFEXOR-XR) 24 hr capsule 150 mg  150 mg Oral Q breakfast Hildred Priest, MD   150 mg at 10/19/14 0925  . vitamin B-12 (CYANOCOBALAMIN) tablet 1,000 mcg  1,000 mcg Oral Daily Hildred Priest, MD   1,000 mcg at 10/19/14 0925  . white petrolatum (VASELINE) gel   Topical PRN Gonzella Lex, MD        Lab Results:  Results for orders placed or performed during the hospital encounter of 10/18/14 (from the past 48 hour(s))  TSH     Status: None   Collection Time: 10/18/14  6:31 PM  Result Value Ref Range   TSH 1.486 0.350 - 4.500 uIU/mL  Hemoglobin A1c     Status: Abnormal   Collection Time: 10/18/14  6:31 PM  Result Value Ref Range   Hgb A1c MFr Bld  10.9 (H) 4.0 - 6.0 %  Lipid panel     Status: None   Collection Time: 10/18/14  6:31 PM  Result Value Ref Range   Cholesterol 116 0 - 200 mg/dL   Triglycerides 67 <150 mg/dL   HDL 63 >40 mg/dL   Total CHOL/HDL Ratio 1.8 RATIO   VLDL 13 0 - 40 mg/dL   LDL Cholesterol 40 0 - 99 mg/dL    Comment:        Total Cholesterol/HDL:CHD Risk Coronary Heart Disease Risk Table                     Men   Women  1/2 Average Risk   3.4   3.3  Average Risk       5.0   4.4  2 X Average Risk   9.6   7.1  3 X Average Risk  23.4   11.0        Use the calculated Patient Ratio above and the CHD Risk Table to determine the patient's CHD Risk.        ATP III CLASSIFICATION (LDL):  <100     mg/dL   Optimal  100-129  mg/dL   Near or Above                    Optimal  130-159  mg/dL   Borderline  160-189  mg/dL   High  >190     mg/dL   Very High   Ammonia     Status: None   Collection Time: 10/18/14  6:31 PM  Result Value Ref Range   Ammonia 15 9 - 35 umol/L    Physical Findings: AIMS: Facial and Oral Movements Muscles of Facial Expression: None, normal Lips and Perioral  Area: None, normal Jaw: None, normal Tongue: None, normal,Extremity Movements Upper (arms, wrists, hands, fingers): None, normal Lower (legs, knees, ankles, toes): None, normal, Trunk Movements Neck, shoulders, hips: None, normal, Overall Severity Severity of abnormal movements (highest score from questions above): None, normal Incapacitation due to abnormal movements: None, normal Patient's awareness of abnormal movements (rate only patient's report): No Awareness, Dental Status Current problems with teeth and/or dentures?: Yes Does patient usually wear dentures?: Yes (at home)  CIWA:    COWS:     Treatment Plan Summary: Daily contact with patient to assess and evaluate symptoms and progress in treatment and Medication management  65 year old Caucasian female with history of depression. Patient presents to the hospital with a possibly new onset paranoia and suicidal ideation. The patient has been in 3 different hospitals over the last couple of weeks, [facility] and 2 days ago [facility]. Per family pt has always been paranoid but lately has become much worse and patient is not able to function. Patient has made clear persecutory statements to the family much more intense and severe than in the past. Family worries about the amount of medication patient is taking. They also wonder about the possibility of dementia.  MDD with psychotic features: will continue effexor XR but will increase dose to 150 mg po q day. For psychosis pt will be started on haldol 0.5 mg po bid. Patient is displaying in tangential thought processes and her speech is somewhat pressured. Bipolar disorder also needs to be considered as part of the working diagnosis.  HTN: continue norvas but will increase to 5 mg po q day.  Dyslipidemia: continue pravachol 40 mg po q day  DM:  continue metformin 500 mg po bid.  HbA1c >10 today.  Nurse Diabetes specilist recommended lantus 9 mg qhs. CBG  tid and supplemental insulin tid.   Vit B12 def: will check levels today. Continue b12 1000 mcg /day.  Pending b12 results  Vit D def: continue vit D 2000 U q day  Anemia: continue Fe So4 150 mg po q day  Urinary incontinence: continue ditropan 5 mg po qhs  GERD: continue PPI q day  Back pain: will consult PT as pt uses a back brace at home.Pending consult  Collateral info: contacted pt's family [phone] (son) and [phone] Brother  Labs: Lipid panel was wnl. Ammonia was 15.  TSH was wnl. Pending HIV, RPR, Vit B12  Imaging: Brain MRI ordered.  Medical Decision Making:  New problem, with additional work up planned     Hildred Priest 10/19/2014, 12:52 PM

## 2014-10-19 NOTE — BHH Group Notes (Signed)
[place] Group Notes:  (Nursing/MHT/Case Management/Adjunct)  Date:  10/19/2014  Time:  9:48 AM  Type of Therapy:  Psychoeducational Skills  Participation Level:  Active  Participation Quality:  Appropriate, Attentive and Sharing  Affect:  Appropriate  Cognitive:  Appropriate and Oriented  Insight:  Appropriate and Good  Engagement in Group:  Engaged  Modes of Intervention:  Discussion, Education, Exploration and Support  Summary of Progress/Problems:  [name] 10/19/2014, 9:48 AM

## 2014-10-19 NOTE — Progress Notes (Signed)
PT Cancellation Note  Patient Details Name: Erika Jacobs MRN: [id number] DOB: April 07, 1951   Cancelled Treatment:    Reason Eval/Treat Not Completed: Medical issues which prohibited therapy;Patient at procedure or test/unavailable.  Will try later if time permits   Ramond Dial 10/19/2014, 3:08 PM   Mee Hives, PT MS Acute Rehab Dept. Number: [facility] [id number] and [place] [phone]

## 2014-10-19 NOTE — BHH Group Notes (Signed)
[place] Group Notes:  (Nursing/MHT/Case Management/Adjunct)  Date:  10/19/2014  Time:  2:31 AM  Type of Therapy:  Group Therapy  Participation Level:  Active  Participation Quality:  Appropriate  Affect:  Appropriate  Cognitive:  Appropriate  Insight:  Good  Engagement in Group:  Engaged  Modes of Intervention:  n/a  Summary of Progress/Problems:  Erika Jacobs 10/19/2014, 2:31 AM

## 2014-10-20 DIAGNOSIS — F315 Bipolar disorder, current episode depressed, severe, with psychotic features: Secondary | ICD-10-CM

## 2014-10-20 LAB — GLUCOSE, CAPILLARY
GLUCOSE-CAPILLARY: 172 mg/dL — AB (ref 65–99)
Glucose-Capillary: 122 mg/dL — ABNORMAL HIGH (ref 65–99)
Glucose-Capillary: 128 mg/dL — ABNORMAL HIGH (ref 65–99)
Glucose-Capillary: 182 mg/dL — ABNORMAL HIGH (ref 65–99)

## 2014-10-20 MED ORDER — ARIPIPRAZOLE 15 MG PO TABS
15.0000 mg | ORAL_TABLET | Freq: Every day | ORAL | Status: DC
  Administered 2014-10-21 – 2014-10-23 (×3): 15 mg via ORAL
  Filled 2014-10-20 (×3): qty 1

## 2014-10-20 MED ORDER — ZOLPIDEM TARTRATE 5 MG PO TABS: 5.0000 mg | ORAL_TABLET | Freq: Every evening | ORAL | Status: DC | PRN

## 2014-10-20 NOTE — Plan of Care (Signed)
Problem: Alteration in mood Goal: LTG-Pt's behavior demonstrates decreased signs of depression (Patient's behavior demonstrates decreased signs of depression to the point the patient is safe to return home and continue treatment in an outpatient setting)  Outcome: Progressing Rates depression as 4

## 2014-10-20 NOTE — Progress Notes (Signed)
[facility] MD Progress Note  10/20/2014 12:37 PM Erika Jacobs  MRN:  [id number] Subjective:  Patient denies SI, HI or having auditory or visual hallucinations. Patient denies major problems with  appetite, energy or concentration.  Reports sleep was poor last night (5 H), says is not rare for her to only get 3 h "that is normal for me". Patient denies side effects from medications. Patient denies having major physical complaints today.   At examination the patient continues to be tangential. She requires frequent redirection as she will not answer questions directly unless prompted a couple of times.  Patient is also hyperverbal and frequently interrupts this Probation officer during conversation. Patient did reported yesterday that in the past he was mentioned to her that she had bipolar disorder. This was done by a physician that saw her in [place] many years ago. Per the patient's family she does display a history of impulsive behaviors .  Has been in prison in [place] due to helping her ex-husband smuggle guns into the [place].  She has been married 5 times and has been involved with a gang members.  Per nursing: appears to have manic symptoms.  Principal Problem: Severe manic bipolar 1 disorder with psychotic behavior Diagnosis:   Patient Active Problem List   Diagnosis Date Noted  . Severe manic bipolar 1 disorder with psychotic behavior [F31.2] 10/20/2014  . Dyslipidemia [E78.5] 10/18/2014  . GERD (gastroesophageal reflux disease) [K21.9] 10/18/2014  . Hypothyroidism [E03.9] 10/18/2014  . Diabetes [E11.9] 10/17/2014  . Hypertension [I10] 10/17/2014  . Urinary incontinence [R32] 10/17/2014  . Suicidal ideation [R45.851]   . Cholecystitis with cholelithiasis [K80.10] 06/02/2013   Total Time spent with patient: 30 minutes   Past Medical History:  Past Medical History  Diagnosis Date  . Diabetes mellitus without complication   . Panic attack   . Hypertension   . Hypercholesteremia   . GERD  (gastroesophageal reflux disease)   . Hypothyroid   . Overactive bladder   . Arthritis   . Anemia   . Shortness of breath     Past Surgical History  Procedure Laterality Date  . Tubal ligation    . Eye surgery Left   . Colonoscopy with esophagogastroduodenoscopy (egd) N/A 05/20/2013    Procedure: COLONOSCOPY WITH ESOPHAGOGASTRODUODENOSCOPY (EGD);  Surgeon: Rogene Houston, MD;  Location: [facility] ENDO SUITE;  Service: Endoscopy;  Laterality: N/A;  730  . Cholecystectomy N/A 06/02/2013    Procedure: LAPAROSCOPIC CHOLECYSTECTOMY;  Surgeon: Scherry Ran, MD;  Location: [facility] ORS;  Service: General;  Laterality: N/A;   Family History: History reviewed. No pertinent family history. Social History:  History  Alcohol Use No     History  Drug Use No    History   Social History  . Marital Status: Divorced    Spouse Name: N/A  . Number of Children: N/A  . Years of Education: N/A   Social History Main Topics  . Smoking status: Former Smoker -- 2.00 packs/day for 30 years    Types: Cigarettes    Quit date: 05/20/2002  . Smokeless tobacco: Not on file  . Alcohol Use: No  . Drug Use: No  . Sexual Activity: Yes    Birth Control/ Protection: Post-menopausal   Other Topics Concern  . None   Social History Narrative   Additional History:    Sleep: Poor  Appetite:  Good   Assessment:   Musculoskeletal: Strength & Muscle Tone: within normal limits Gait & Station: normal Patient leans: N/A  Psychiatric Specialty Exam: Physical Exam   Review of Systems  Constitutional: Negative.   HENT: Negative.   Eyes: Negative.   Respiratory: Negative.   Cardiovascular: Negative.   Gastrointestinal: Negative.   Genitourinary: Negative.   Musculoskeletal: Negative.   Skin: Negative.   Neurological: Negative.   Endo/Heme/Allergies: Negative.   Psychiatric/Behavioral: Negative.     Blood pressure 145/65, pulse 87, temperature 98.5 F (36.9 C), temperature source Oral, resp. rate  18, height 5' (1.524 m), weight 86.183 kg (190 lb), SpO2 100 %.Body mass index is 37.11 kg/(m^2).  General Appearance: Well Groomed  Engineer, water::  Good  Speech:  Clear and Coherent  Volume:  Normal  Mood:  Euthymic  Affect:  Congruent  Thought Process:  Linear  Orientation:  Full (Time, Place, and Person)  Thought Content:  Hallucinations: None  Suicidal Thoughts:  No  Homicidal Thoughts:  No  Memory:  Immediate;   Good Recent;   Good Remote;   Good  Judgement:  Fair  Insight:  Fair  Psychomotor Activity:  Normal  Concentration:  Good  Recall:  NA  Fund of Knowledge:Good  Language: Good  Akathisia:  No  Handed:    AIMS (if indicated):     Assets:  Communication Skills Desire for Improvement Financial Resources/Insurance Housing Social Support  ADL's:  Intact  Cognition: WNL  Sleep:  Number of Hours: 5.15     Current Medications: Current Facility-Administered Medications  Medication Dose Route Frequency Provider Last Rate Last Dose  . acetaminophen (TYLENOL) tablet 650 mg  650 mg Oral Q6H PRN Hildred Priest, MD      . alum & mag hydroxide-simeth (MAALOX/MYLANTA) 200-200-20 MG/5ML suspension 30 mL  30 mL Oral Q4H PRN Hildred Priest, MD      . amLODipine (NORVASC) tablet 5 mg  5 mg Oral Daily Hildred Priest, MD   5 mg at 10/20/14 0957  . [START ON 10/21/2014] ARIPiprazole (ABILIFY) tablet 15 mg  15 mg Oral Daily Hildred Priest, MD      . cholecalciferol (VITAMIN D) tablet 2,000 Units  2,000 Units Oral Daily Hildred Priest, MD   2,000 Units at 10/19/14 [phone]  . insulin aspart (novoLOG) injection 0-9 Units  0-9 Units Subcutaneous TID WC Hildred Priest, MD   2 Units at 10/20/14 [id number]  . insulin glargine (LANTUS) injection 9 Units  9 Units Subcutaneous QHS Hildred Priest, MD   9 Units at 10/19/14 2213  . iron polysaccharides (NIFEREX) capsule 150 mg  150 mg Oral Daily Hildred Priest, MD   150  mg at 10/20/14 0957  . magnesium hydroxide (MILK OF MAGNESIA) suspension 30 mL  30 mL Oral Daily PRN Hildred Priest, MD      . metFORMIN (GLUCOPHAGE) tablet 500 mg  500 mg Oral BID WC Hildred Priest, MD   500 mg at 10/20/14 0815  . oxybutynin (DITROPAN) tablet 5 mg  5 mg Oral QHS Hildred Priest, MD   5 mg at 10/19/14 2213  . pantoprazole (PROTONIX) EC tablet 40 mg  40 mg Oral QAC breakfast Hildred Priest, MD   40 mg at 10/20/14 0814  . pravastatin (PRAVACHOL) tablet 40 mg  40 mg Oral Daily Hildred Priest, MD   40 mg at 10/20/14 0956  . venlafaxine XR (EFFEXOR-XR) 24 hr capsule 150 mg  150 mg Oral Q breakfast Hildred Priest, MD   150 mg at 10/20/14 0814  . vitamin B-12 (CYANOCOBALAMIN) tablet 1,000 mcg  1,000 mcg Oral Daily Hildred Priest, MD   1,000 mcg at  10/20/14 0957  . white petrolatum (VASELINE) gel   Topical PRN Gonzella Lex, MD      . zolpidem (AMBIEN) tablet 5 mg  5 mg Oral QHS PRN Hildred Priest, MD        Lab Results:  Results for orders placed or performed during the hospital encounter of 10/18/14 (from the past 48 hour(s))  TSH     Status: None   Collection Time: 10/18/14  6:31 PM  Result Value Ref Range   TSH 1.486 0.350 - 4.500 uIU/mL  Vitamin B12     Status: None   Collection Time: 10/18/14  6:31 PM  Result Value Ref Range   Vitamin B-12 741 180 - 914 pg/mL    Comment: (NOTE) This assay is not validated for testing neonatal or myeloproliferative syndrome specimens for Vitamin B12 levels. Performed at [facility]   Hemoglobin A1c     Status: Abnormal   Collection Time: 10/18/14  6:31 PM  Result Value Ref Range   Hgb A1c MFr Bld 10.9 (H) 4.0 - 6.0 %  Lipid panel     Status: None   Collection Time: 10/18/14  6:31 PM  Result Value Ref Range   Cholesterol 116 0 - 200 mg/dL   Triglycerides 67 <150 mg/dL   HDL 63 >40 mg/dL   Total CHOL/HDL Ratio 1.8 RATIO   VLDL 13 0 - 40  mg/dL   LDL Cholesterol 40 0 - 99 mg/dL    Comment:        Total Cholesterol/HDL:CHD Risk Coronary Heart Disease Risk Table                     Men   Women  1/2 Average Risk   3.4   3.3  Average Risk       5.0   4.4  2 X Average Risk   9.6   7.1  3 X Average Risk  23.4   11.0        Use the calculated Patient Ratio above and the CHD Risk Table to determine the patient's CHD Risk.        ATP III CLASSIFICATION (LDL):  <100     mg/dL   Optimal  100-129  mg/dL   Near or Above                    Optimal  130-159  mg/dL   Borderline  160-189  mg/dL   High  >190     mg/dL   Very High   Ammonia     Status: None   Collection Time: 10/18/14  6:31 PM  Result Value Ref Range   Ammonia 15 9 - 35 umol/L  Glucose, capillary     Status: Abnormal   Collection Time: 10/19/14  5:25 PM  Result Value Ref Range   Glucose-Capillary 250 (H) 65 - 99 mg/dL  Glucose, capillary     Status: Abnormal   Collection Time: 10/19/14  8:21 PM  Result Value Ref Range   Glucose-Capillary 157 (H) 65 - 99 mg/dL  Glucose, capillary     Status: Abnormal   Collection Time: 10/20/14  7:05 AM  Result Value Ref Range   Glucose-Capillary 172 (H) 65 - 99 mg/dL  Glucose, capillary     Status: Abnormal   Collection Time: 10/20/14 12:09 PM  Result Value Ref Range   Glucose-Capillary 122 (H) 65 - 99 mg/dL    Physical Findings: AIMS: Facial and Oral Movements  Muscles of Facial Expression: None, normal Lips and Perioral Area: None, normal Jaw: None, normal Tongue: None, normal,Extremity Movements Upper (arms, wrists, hands, fingers): None, normal Lower (legs, knees, ankles, toes): None, normal, Trunk Movements Neck, shoulders, hips: None, normal, Overall Severity Severity of abnormal movements (highest score from questions above): None, normal Incapacitation due to abnormal movements: None, normal Patient's awareness of abnormal movements (rate only patient's report): No Awareness, Dental Status Current problems  with teeth and/or dentures?: Yes Does patient usually wear dentures?: Yes (at home)  CIWA:    COWS:     Treatment Plan Summary: Daily contact with patient to assess and evaluate symptoms and progress in treatment and Medication management  64 year old Caucasian female with history of depression. Patient presents to the hospital with a possibly new onset paranoia and suicidal ideation. The patient has been in 3 different hospitals over the last couple of weeks, [facility] and 2 days ago [facility]. Per family pt has always been paranoid but lately has become much worse and patient is not able to function. Patient has made clear persecutory statements to the family much more intense and severe than in the past. Family worries about the amount of medication patient is taking. They also wonder about the possibility of dementia.  Mania/psychosis: continue effexor XR 150 mg po q day.  Abilify 10 mg was started today.  Diagnosis has been changed to bipolar disorder.  Abilify will be increased to 15 mg po q day tomorrow  Insomnia: will start ambien prn  HTN: continue norvas 5 mg po q day.  If BP continues to be elevated over the weekend please increase norvasc to 10 mg o q day.  Dyslipidemia: continue pravachol 40 mg po q day  DM: continue metformin 500 mg po bid.  HbA1c >10 today.  Nurse Diabetes specilist recommended lantus 9 mg qhs. CBG tid and supplemental insulin tid.   Vit B12 def: will check levels today. Continue b12 1000 mcg /day.   Vit D def: continue vit D 2000 U q day  Anemia: continue Fe So4 150 mg po q day  Urinary incontinence: continue ditropan 5 mg po qhs  GERD: continue PPI q day  Back pain: will consult PT as pt uses a back brace at home.Pending consult, they came to see pt yesterday while she was in radiology.  Collateral info: contacted pt's family [phone] (son) and [phone] Brother  Labs: Lipid panel was wnl. Ammonia was 15.   TSH was wnl. Vit B12 was wnl. Pending HIV, RPR.  Imaging: Brain MRI completed on 6/30---neg  Medical Decision Making:  New problem, with additional work up planned     Hildred Priest 10/20/2014, 12:37 PM

## 2014-10-20 NOTE — Progress Notes (Signed)
Inpatient Diabetes Program Recommendations  AACE/ADA: New Consensus Statement on Inpatient Glycemic Control (2013)  Target Ranges:  Prepandial:   less than 140 mg/dL      Peak postprandial:   less than 180 mg/dL (1-2 hours)      Critically ill patients:  140 - 180 mg/dL    Reason for assessment: elevated A1C of 10.9%  Diabetes history: Type 2 Outpatient Diabetes medications: Metformin 500mg  bid- not taking as ordered (per note) Current orders for Inpatient glycemic control: Metformin 500mg  bid, Lantus 9 units qhs, Novolog sensitive correction 0-9 units tid  Consider adding hs correction insulin; Novolog hs correction scale 0-5 units    Gentry Fitz, RN, [place], [place], CDE Diabetes Coordinator Inpatient Diabetes Program  [phone] (Team Pager) [phone] ([place]) 10/20/2014 10:29 AM

## 2014-10-20 NOTE — Tx Team (Signed)
Interdisciplinary Treatment Plan Update (Adult)  Date: 10/20/2014  Time Reviewed:  2:15 PM   Progress in Treatment: Attending groups: Yes. Participating in groups:  Yes. Taking medication as prescribed:  Yes. Tolerating medication:  Yes. Family/Significant othe contact made:  No, will contact:  Son  Patient understands diagnosis:  No. Limited insight  Discussing patient identified problems/goals with staff: Yes Medical problems stabilized or resolved:  Yes. Denies suicidal/homicidal ideation: Yes  Issues/concerns per patient self-inventory: None   Other:  New problem(s) identified: N/A  Discharge Plan or Barriers: Pt plans to return home ans follow up with outpatient.   Reason for Continuation of Hospitalization: Suicidal Ideations  Delusions  Comments:Erika Jacobs is a 64 y.o. female who complains of stress and anxiety. She states that she came to the ER today because she was referred here by [facility]ac[facility] Pontes. Review of the records reveals that the patient was in the [facility] ER on June 25, and they plan to admit her for psychiatric stabilization with psychosis. However, the patient had a very good response to risperidoneand was discharged home with instructions to follow-up with [facility]. She notes that she is had ongoing racing thoughts and paranoia as she usually does. She also had a brief time period of suicidal thoughts this morning without any plans. She tells me that her mood is very depressed and sad. She thinks it's been that way for years. She can't specify how long it's been getting worse. She says she has terrible stress on her but when I asked what it was all she can tell me is that her children bother her. She is sleeping poorly at night. Not eating. Says that she hears voices. She will not specify anything about what they actually say. All she says is "all kinds of things". She states that she is having suicidal ideation. She does not state any  specific plan.  Patient during the interview reported that she felt the physicians in the emergency department were mocking her and were performing an [place] dance. She is stated that she became suicidal because nobody was helping her in the emergency department and she thinks they were doing that on purpose to push her to her limits.    Estimated length of stay: 5-7 days   New goal(s): NA  Review of initial/current patient goals per problem list:    Attendees: Patient: [name]  6/23/20162:27 PM  Family:   6/23/20162:27 PM  Physician:  Merlyn Albert, MD 6/23/20162:27 PM  Nursing:     6/23/20162:27 PM  [place], [place] 6/23/20162:27 PM  Other:  Toma Copier, LCSW 6/23/20162:27 PM  Other:  Silva Bandy, RN  6/23/20162:27 PM  Other:  Barry Dienes Liaison  6/23/20162:27 PM  Other:   6/23/20162:27 PM  Other:  6/23/20162:27 PM  Other:  6/23/20162:27 PM  Other:  6/23/20162:27 PM  Other:  6/23/20162:27 PM  Other:  6/23/20162:27 PM  Other:  6/23/20162:27 PM  Other:   6/23/20162:27 PM   Scribe for Treatment Team:   Wray Kearns, MSW, LCSWA 10/20/2014 2:13 PM

## 2014-10-20 NOTE — Evaluation (Signed)
Physical Therapy Evaluation Patient Details Name: Erika Jacobs MRN: [id number] DOB: [birth year] Today's Date: 10/20/2014   History of Present Illness  64 yo female with onset of LBP from chronic issues, admitted for depression and suicidal ideation with symptoms resolving.   Clinical Impression  Pt will be seen for one more visit to review simple stretches which pt wrote for herself as instructed by PT.  Will check on her to see that she is doing well and will not need further instruction as her initial instruction was repetitive.  Will likely be ready to DC     Follow Up Recommendations Other (comment) (PT to see one more visit to check on her performance of ex)    Equipment Recommendations  None recommended by PT    Recommendations for Other Services       Precautions / Restrictions Precautions Precautions: None Precaution Comments: Using RW for support to spine Restrictions Weight Bearing Restrictions: No      Mobility  Bed Mobility Overal bed mobility: Modified Independent                Transfers Overall transfer level: Modified independent Equipment used: Rolling walker (2 wheeled);None (walked with and without walker)                Ambulation/Gait Ambulation/Gait assistance: Independent Ambulation Distance (Feet): 200 Feet Assistive device: None   Gait velocity: normal Gait velocity interpretation: Below normal speed for age/gender General Gait Details: even stride with no favoring of any LE and no lateral listing of gait  Stairs            Wheelchair Mobility    Modified Rankin (Stroke Patients Only)       Balance Overall balance assessment: Modified Independent                                           Pertinent Vitals/Pain Pain Assessment: Faces Faces Pain Scale: Hurts little more Pain Location: back with stretches Pain Intervention(s): Monitored during session;Other (comment) (Stretches applied in  therapy)    Home Living Family/patient expects to be discharged to:: Private residence Living Arrangements: Alone   Type of Home: [place]: One [place]: Environmental consultant - 2 wheels;Cane - single point      Prior Function Level of Independence: Independent               Hand Dominance        Extremity/Trunk Assessment   Upper Extremity Assessment: Overall WFL for tasks assessed           Lower Extremity Assessment: Overall WFL for tasks assessed      Cervical / Trunk Assessment: Normal  Communication   Communication: No difficulties  Cognition Arousal/Alertness: Awake/alert Behavior During Therapy: Flat affect Overall Cognitive Status: Within Functional Limits for tasks assessed                      General Comments General comments (skin integrity, edema, etc.): Pt was seen and noted comfortable tolerance for ther ex with inconsistent amounts of stretch.  Her plan is to do the stretches to back and neck as instructed for chronic pain and therapy will ck on her next week.    Exercises Other Exercises Other Exercises: Lumbar flexion Other Exercises: Lumbar rotation sitting Other Exercises: Lumbar side bend Other  Exercises: Cervical rotation Other Exercises: Cervical sidebend      Assessment/Plan    PT Assessment Patient needs continued PT services  PT Diagnosis Other (comment) (Lumbar ROM losses related to disuse)   PT Problem List    PT Treatment Interventions Therapeutic exercise   PT Goals (Current goals can be found in the Care Plan section) Acute Rehab PT Goals Patient Stated Goal: to move comfortably PT Goal Formulation: With patient Time For Goal Achievement: 10/27/14 Potential to Achieve Goals: Good    Frequency Min 2X/week   Barriers to discharge        Co-evaluation               End of Session   Activity Tolerance: Patient tolerated treatment well;No increased pain Patient left: in chair;with  nursing/sitter in room Nurse Communication: Mobility status         Time: [id number] PT Time Calculation (min) (ACUTE ONLY): 30 min   Charges:   PT Evaluation $Initial PT Evaluation Tier I: 1 Procedure PT Treatments $Therapeutic Exercise: 8-22 mins   PT G Codes:        Ramond Dial 11-04-14, 1:27 PM   Mee Hives, PT MS Acute Rehab Dept. Number: [facility] [phone] and [place] [phone]

## 2014-10-20 NOTE — BHH Group Notes (Signed)
[place] Group Notes:  (Nursing/MHT/Case Management/Adjunct)  Date:  10/20/2014  Time:  12:01 PM  Type of Therapy:  Psychoeducational Skills  Participation Level:  Active  Participation Quality:  Appropriate  Affect:  Appropriate  Cognitive:  Appropriate  Insight:  Appropriate  Engagement in Group:  Engaged  Modes of Intervention:  Discussion and Education  Summary of Progress/Problems:  [name] 10/20/2014, 12:01 PM

## 2014-10-20 NOTE — BHH Group Notes (Signed)
[facility] LCSW Aftercare Discharge Planning Group Note   10/20/2014 3:16 PM  Participation Quality:  Active   Mood/Affect:  Flat  Depression Rating:  8  Anxiety Rating:  5  Thoughts of Suicide:  No Will you contract for safety?   NA  Current AVH:  No  Plan for Discharge/Comments:  Pt plans to return home and follow up with outpatient.   Transportation Means: Family    Supports: [place] MSW, [employer]

## 2014-10-20 NOTE — Plan of Care (Signed)
Problem: Acute Rehab PT Goals(only PT should resolve) Goal: Pt/caregiver will Perform Home Exercise Program To move neck and low back for 10 x 2 sets daily with correct stretch technique

## 2014-10-20 NOTE — Progress Notes (Signed)
More visible in milieu.  Smiles easily.  Denies SI.  Medication and group compliant.

## 2014-10-20 NOTE — Plan of Care (Signed)
Problem: Alteration in mood Goal: LTG-Patient reports reduction in suicidal thoughts (Patient reports reduction in suicidal thoughts and is able to verbalize a safety plan for whenever patient is feeling suicidal)  Outcome: Progressing Denies SI     

## 2014-10-21 DIAGNOSIS — F312 Bipolar disorder, current episode manic severe with psychotic features: Principal | ICD-10-CM

## 2014-10-21 LAB — GLUCOSE, CAPILLARY
GLUCOSE-CAPILLARY: 121 mg/dL — AB (ref 65–99)
GLUCOSE-CAPILLARY: 103 mg/dL — AB (ref 65–99)
Glucose-Capillary: 92 mg/dL (ref 65–99)
Glucose-Capillary: 100 mg/dL — ABNORMAL HIGH (ref 65–99)

## 2014-10-21 MED ORDER — DOCUSATE SODIUM 100 MG PO CAPS
100.0000 mg | ORAL_CAPSULE | Freq: Two times a day (BID) | ORAL | Status: DC
  Administered 2014-10-21 – 2014-10-25 (×8): 100 mg via ORAL
  Filled 2014-10-21 (×8): qty 1

## 2014-10-21 MED ORDER — VENLAFAXINE HCL ER 75 MG PO CP24
75.0000 mg | ORAL_CAPSULE | Freq: Every day | ORAL | Status: DC
  Administered 2014-10-22 – 2014-10-25 (×4): 75 mg via ORAL
  Filled 2014-10-21 (×4): qty 1

## 2014-10-21 MED ORDER — POLYETHYLENE GLYCOL 3350 17 G PO PACK
17.0000 g | PACK | Freq: Every day | ORAL | Status: DC
  Administered 2014-10-22 – 2014-10-24 (×2): 17 g via ORAL
  Filled 2014-10-21 (×3): qty 1

## 2014-10-21 MED ORDER — ASPIRIN 81 MG PO CHEW
81.0000 mg | CHEWABLE_TABLET | Freq: Every day | ORAL | Status: DC
  Administered 2014-10-22 – 2014-10-25 (×4): 81 mg via ORAL
  Filled 2014-10-21 (×4): qty 1

## 2014-10-21 NOTE — BHH Group Notes (Signed)
[place] Group Notes:  (Nursing/MHT/Case Management/Adjunct)  Date:  10/21/2014  Time:  3:43 AM  Type of Therapy:  Group Therapy  Participation Level:  Active  Participation Quality:  Appropriate  Affect:  Appropriate  Cognitive:  Appropriate  Insight:  Good  Engagement in Group:  Engaged  Modes of Intervention:  n/a  Summary of Progress/Problems:  Erika Jacobs 10/21/2014, 3:43 AM

## 2014-10-21 NOTE — Progress Notes (Signed)
Pleasant and cooperative.  Denies SI or depression. Continues to be hyper verbal and fixated on medications.  Verbalizes that she feels that her medication dose is to high and causing her to feel drowsy.  Actively participates in groups.  Safety maintained.

## 2014-10-21 NOTE — Plan of Care (Signed)
Problem: Alteration in mood Goal: LTG-Patient reports reduction in suicidal thoughts (Patient reports reduction in suicidal thoughts and is able to verbalize a safety plan for whenever patient is feeling suicidal)  Outcome: Progressing Denies SI     

## 2014-10-21 NOTE — Progress Notes (Signed)
D: Pt denies SI/HI/AVH. Pt is pleasant and cooperative. Patient's affect is flat but brightens upon approach, patient appears less anxious and she is interacting with peers and staff appropriately.  A: Pt was offered support and encouragement. Pt was given scheduled medications. Pt was encouraged to attend groups. Q 15 minute checks were done for safety.  R:Pt attends groups and interacts well with peers and staff. Pt is taking medication. Pt has no complaints.Pt receptive to treatment and safety maintained on unit.

## 2014-10-21 NOTE — Plan of Care (Signed)
Problem: Alteration in mood Goal: LTG-Patient reports reduction in suicidal thoughts (Patient reports reduction in suicidal thoughts and is able to verbalize a safety plan for whenever patient is feeling suicidal)  Outcome: Progressing Patient denies SI/HI/AVH, interacting appropriately.

## 2014-10-21 NOTE — BHH Group Notes (Signed)
[place] LCSW Group Therapy  10/21/2014 3:31 PM  Type of Therapy:  Group Therapy  Participation Level:  Minimal  Participation Quality:  Attentive  Affect:  Flat  Cognitive:  Alert  Insight:  Improving  Engagement in Therapy:  Improving  Modes of Intervention:  Discussion, Education, Socialization and Support  Summary of Progress/Problems: Patients were encouraged to define what community means to them. They were encouraged to identify formal and informal support within their community. Erika Jacobs states she is doing ok today. She feels like her mind is becoming "clearer." She attended group and stayed the entire time. She sat quietly and listened to other group members.    [place] MSW, [place]  10/21/2014, 3:31 PM

## 2014-10-21 NOTE — Progress Notes (Signed)
[facility] MD Progress Note  10/21/2014 4:30 PM Erika Jacobs  MRN:  [id number]  Subjective:  Erika Jacobs still appears hypomanic, disorganized, unable to focus. She is preoccupied with inconsistencies of her medication regimen. She wants aspirin 81 mg, Colace, and fiber. She feels that Ativan prescribed yesterday slows her down and makes her sluggish. She seems to have little insight into her problems. She denies any symptoms of depression anxiety or psychosis. She denies symptoms of bipolar mania. There are no somatic complaints except for constipation. She accepts and tolerates medication well. She has been active in groups. The patient is other concern about her discharge arrangements. She does not want to return to [place] to stay by herself instead she would like to move to [place] on with her sister-in-law. She will need a follow-up locally.  Principal Problem: Severe manic bipolar 1 disorder with psychotic behavior Diagnosis:   Patient Active Problem List   Diagnosis Date Noted  . Severe manic bipolar 1 disorder with psychotic behavior [F31.2] 10/20/2014  . Dyslipidemia [E78.5] 10/18/2014  . GERD (gastroesophageal reflux disease) [K21.9] 10/18/2014  . Hypothyroidism [E03.9] 10/18/2014  . Diabetes [E11.9] 10/17/2014  . Hypertension [I10] 10/17/2014  . Urinary incontinence [R32] 10/17/2014  . Suicidal ideation [R45.851]   . Cholecystitis with cholelithiasis [K80.10] 06/02/2013   Total Time spent with patient: 20 minutes   Past Medical History:  Past Medical History  Diagnosis Date  . Diabetes mellitus without complication   . Panic attack   . Hypertension   . Hypercholesteremia   . GERD (gastroesophageal reflux disease)   . Hypothyroid   . Overactive bladder   . Arthritis   . Anemia   . Shortness of breath     Past Surgical History  Procedure Laterality Date  . Tubal ligation    . Eye surgery Left   . Colonoscopy with esophagogastroduodenoscopy (egd) N/A 05/20/2013     Procedure: COLONOSCOPY WITH ESOPHAGOGASTRODUODENOSCOPY (EGD);  Surgeon: Rogene Houston, MD;  Location: [facility];  Service: Endoscopy;  Laterality: N/A;  730  . Cholecystectomy N/A 06/02/2013    Procedure: LAPAROSCOPIC CHOLECYSTECTOMY;  Surgeon: Scherry Ran, MD;  Location: [facility] ORS;  Service: General;  Laterality: N/A;   Family History: History reviewed. No pertinent family history. Social History:  History  Alcohol Use No     History  Drug Use No    History   Social History  . Marital Status: Divorced    Spouse Name: N/A  . Number of Children: N/A  . Years of Education: N/A   Social History Main Topics  . Smoking status: Former Smoker -- 2.00 packs/day for 30 years    Types: Cigarettes    Quit date: 05/20/2002  . Smokeless tobacco: Not on file  . Alcohol Use: No  . Drug Use: No  . Sexual Activity: Yes    Birth Control/ Protection: Post-menopausal   Other Topics Concern  . None   Social History Narrative   Additional History:    Sleep: Good  Appetite:  Good   Assessment:   Musculoskeletal: Strength & Muscle Tone: within normal limits Gait & Station: normal Patient leans: N/A   Psychiatric Specialty Exam: Physical Exam  Nursing note and vitals reviewed.   Review of Systems  All other systems reviewed and are negative.   Blood pressure 137/80, pulse 84, temperature 97.5 F (36.4 C), temperature source Oral, resp. rate 18, height 5' (1.524 m), weight 86.183 kg (190 lb), SpO2 100 %.Body mass index is 37.[place]  kg/(m^2).  General Appearance: Disheveled  Eye Sport and exercise psychologist::  Fair  Speech:  Pressured  Volume:  Normal  Mood:  Dysphoric  Affect:  Labile  Thought Process:  Disorganized  Orientation:  Full (Time, Place, and Person)  Thought Content:  WDL  Suicidal Thoughts:  No  Homicidal Thoughts:  No  Memory:  Immediate;   Fair Recent;   Fair Remote;   Good  Judgement:  Impaired  Insight:  Lacking  Psychomotor Activity:  Normal  Concentration:  Fair   Recall:  [employer] of Knowledge:Fair  Language: Fair  Akathisia:  No  Handed:  Right  AIMS (if indicated):     Assets:  Communication Skills Desire for Improvement Financial Resources/Insurance  ADL's:  Intact  Cognition: WNL  Sleep:  Number of Hours: 6.75     Current Medications: Current Facility-Administered Medications  Medication Dose Route Frequency Provider Last Rate Last Dose  . acetaminophen (TYLENOL) tablet 650 mg  650 mg Oral Q6H PRN Hildred Priest, MD      . alum & mag hydroxide-simeth (MAALOX/MYLANTA) 200-200-20 MG/5ML suspension 30 mL  30 mL Oral Q4H PRN Hildred Priest, MD      . amLODipine (NORVASC) tablet 5 mg  5 mg Oral Daily Hildred Priest, MD   5 mg at 10/21/14 0908  . ARIPiprazole (ABILIFY) tablet 15 mg  15 mg Oral Daily Hildred Priest, MD   15 mg at 10/21/14 0908  . cholecalciferol (VITAMIN D) tablet 2,000 Units  2,000 Units Oral Daily Hildred Priest, MD   2,000 Units at 10/21/14 0908  . insulin aspart (novoLOG) injection 0-9 Units  0-9 Units Subcutaneous TID WC Hildred Priest, MD   100 Units at 10/21/14 0916  . insulin glargine (LANTUS) injection 9 Units  9 Units Subcutaneous QHS Hildred Priest, MD   9 Units at 10/20/14 2214  . iron polysaccharides (NIFEREX) capsule 150 mg  150 mg Oral Daily Hildred Priest, MD   150 mg at 10/21/14 0908  . magnesium hydroxide (MILK OF MAGNESIA) suspension 30 mL  30 mL Oral Daily PRN Hildred Priest, MD      . metFORMIN (GLUCOPHAGE) tablet 500 mg  500 mg Oral BID WC Hildred Priest, MD   500 mg at 10/21/14 0908  . oxybutynin (DITROPAN) tablet 5 mg  5 mg Oral QHS Hildred Priest, MD   5 mg at 10/20/14 2214  . pantoprazole (PROTONIX) EC tablet 40 mg  40 mg Oral QAC breakfast Hildred Priest, MD   40 mg at 10/21/14 0704  . pravastatin (PRAVACHOL) tablet 40 mg  40 mg Oral Daily Hildred Priest, MD    40 mg at 10/21/14 0908  . venlafaxine XR (EFFEXOR-XR) 24 hr capsule 150 mg  150 mg Oral Q breakfast Hildred Priest, MD   150 mg at 10/21/14 0907  . vitamin B-12 (CYANOCOBALAMIN) tablet 1,000 mcg  1,000 mcg Oral Daily Hildred Priest, MD   1,000 mcg at 10/21/14 0907  . white petrolatum (VASELINE) gel   Topical PRN Gonzella Lex, MD      . zolpidem (AMBIEN) tablet 5 mg  5 mg Oral QHS PRN Hildred Priest, MD        Lab Results:  Results for orders placed or performed during the hospital encounter of 10/18/14 (from the past 48 hour(s))  Glucose, capillary     Status: Abnormal   Collection Time: 10/19/14  5:25 PM  Result Value Ref Range   Glucose-Capillary 250 (H) 65 - 99 mg/dL  Glucose, capillary  Status: Abnormal   Collection Time: 10/19/14  8:21 PM  Result Value Ref Range   Glucose-Capillary 157 (H) 65 - 99 mg/dL  Glucose, capillary     Status: Abnormal   Collection Time: 10/20/14  7:05 AM  Result Value Ref Range   Glucose-Capillary 172 (H) 65 - 99 mg/dL  Glucose, capillary     Status: Abnormal   Collection Time: 10/20/14 12:09 PM  Result Value Ref Range   Glucose-Capillary 122 (H) 65 - 99 mg/dL  Glucose, capillary     Status: Abnormal   Collection Time: 10/20/14  4:50 PM  Result Value Ref Range   Glucose-Capillary 128 (H) 65 - 99 mg/dL   Comment 1 Notify RN   Glucose, capillary     Status: Abnormal   Collection Time: 10/20/14  9:28 PM  Result Value Ref Range   Glucose-Capillary 182 (H) 65 - 99 mg/dL  Glucose, capillary     Status: Abnormal   Collection Time: 10/21/14  7:02 AM  Result Value Ref Range   Glucose-Capillary 121 (H) 65 - 99 mg/dL  Glucose, capillary     Status: None   Collection Time: 10/21/14 11:52 AM  Result Value Ref Range   Glucose-Capillary 92 65 - 99 mg/dL   Comment 1 Notify RN     Physical Findings: AIMS: Facial and Oral Movements Muscles of Facial Expression: None, normal Lips and Perioral Area: None, normal Jaw:  None, normal Tongue: None, normal,Extremity Movements Upper (arms, wrists, hands, fingers): None, normal Lower (legs, knees, ankles, toes): None, normal, Trunk Movements Neck, shoulders, hips: None, normal, Overall Severity Severity of abnormal movements (highest score from questions above): None, normal Incapacitation due to abnormal movements: None, normal Patient's awareness of abnormal movements (rate only patient's report): No Awareness, Dental Status Current problems with teeth and/or dentures?: Yes Does patient usually wear dentures?: Yes (at home)  CIWA:    COWS:     Treatment Plan Summary: Daily contact with patient to assess and evaluate symptoms and progress in treatment and Medication management   Medical Decision Making:  Established Problem, Stable/Improving (1), Review of Psycho-Social Stressors (1), Review or order clinical lab tests (1), Review of Medication Regimen & Side Effects (2) and Review of New Medication or Change in Dosage (5)   64 year old Caucasian female with history of depression. Patient presents to the hospital with a possibly new onset paranoia and suicidal ideation. The patient has been in 3 different hospitals over the last couple of weeks, [facility] and 2 days ago [facility]. Per family pt has always been paranoid but lately has become much worse and patient is not able to function. Patient has made clear persecutory statements to the family much more intense and severe than in the past. Family worries about the amount of medication patient is taking. They also wonder about the possibility of dementia.  Mania/psychosis: continue effexor XR 150 mg po q day. Abilify 10 mg was started today. Diagnosis has been changed to bipolar disorder. Abilify will be increased to 15 mg po q day tomorrow  Insomnia: will start ambien prn  HTN: continue norvas 5 mg po q day. If BP continues to be elevated over the weekend please increase  norvasc to 10 mg o q day.  Dyslipidemia: continue pravachol 40 mg po q day  DM: continue metformin 500 mg po bid. HbA1c >10 today. Nurse Diabetes specilist recommended lantus 9 mg qhs. CBG tid and supplemental insulin tid.   Vit B12 def:  will check levels today. Continue b12 1000 mcg /day.   Vit D def: continue vit D 2000 U q day  Anemia: continue Fe So4 150 mg po q day  Urinary incontinence: continue ditropan 5 mg po qhs  GERD: continue PPI q day  Back pain: will consult PT as pt uses a back brace at home.Pending consult, they came to see pt yesterday while she was in radiology.  Constipation: Colace and Miralax.   Stroke prevention. ASA 81 mg.  Collateral info: contacted pt's family [phone] (son) and [phone] Brother  Labs: Lipid panel was wnl. Ammonia was 15. TSH was wnl. Vit B12 was wnl. Pending HIV, RPR.  Imaging: Brain MRI completed on 6/30---neg      Jairus Tonne 10/21/2014, 4:30 PM

## 2014-10-22 LAB — GLUCOSE, CAPILLARY
GLUCOSE-CAPILLARY: 115 mg/dL — AB (ref 65–99)
Glucose-Capillary: 121 mg/dL — ABNORMAL HIGH (ref 65–99)
Glucose-Capillary: 116 mg/dL — ABNORMAL HIGH (ref 65–99)
Glucose-Capillary: 133 mg/dL — ABNORMAL HIGH (ref 65–99)

## 2014-10-22 NOTE — Plan of Care (Signed)
Problem: Alteration in mood Goal: LTG-Patient reports reduction in suicidal thoughts (Patient reports reduction in suicidal thoughts and is able to verbalize a safety plan for whenever patient is feeling suicidal)  Outcome: Progressing Patient denies suicidal ideation.     

## 2014-10-22 NOTE — Progress Notes (Signed)
She is pleasant & cooperative.Denies depression & suicidal ideation.Appripriate with staff & peers.Compliant with meds.Attended groups.No issues verbalized.

## 2014-10-22 NOTE — [facility] Group Notes (Signed)
[facility] Group Notes:  (Nursing/MHT/Case Management/Adjunct)  Date:  10/22/2014  Time:  12:07 AM  Type of Therapy:  Group Therapy  Participation Level:  Active  Participation Quality:  Appropriate  Affect:  Appropriate  Cognitive:  Appropriate  Insight:  Appropriate  Engagement in Group:  Engaged  Modes of Intervention:  n/a  Summary of Progress/Problems:  Erika Jacobs 10/22/2014, 12:07 AM

## 2014-10-22 NOTE — Progress Notes (Signed)
Pleasant and interacting with peers. Gait steady with walker. Was medication compliant. Erika Jacobs had an uneventful night.

## 2014-10-22 NOTE — Progress Notes (Signed)
[facility] MD Progress Note  10/22/2014 2:44 PM DOLLINDA DICELLO  MRN:  [id number]  Subjective:  Miss Losier reports feeling much better today after her Effexor was decreased to 75 mg. She strongly believes that it made her confused yesterday. She reports good sleep. Her appetite is fair. She denies any somatic symptoms today after she had a good bowel movement. She denies any symptoms of bipolar mania or psychotic symptoms. She is still pressured, intrusive, tangential, slightly disorganized. She participates in groups.  Principal Problem: Severe manic bipolar 1 disorder with psychotic behavior Diagnosis:   Patient Active Problem List   Diagnosis Date Noted  . Severe manic bipolar 1 disorder with psychotic behavior [F31.2] 10/20/2014  . Dyslipidemia [E78.5] 10/18/2014  . GERD (gastroesophageal reflux disease) [K21.9] 10/18/2014  . Hypothyroidism [E03.9] 10/18/2014  . Diabetes [E11.9] 10/17/2014  . Hypertension [I10] 10/17/2014  . Urinary incontinence [R32] 10/17/2014  . Suicidal ideation [R45.851]   . Cholecystitis with cholelithiasis [K80.10] 06/02/2013   Total Time spent with patient: 20 minutes   Past Medical History:  Past Medical History  Diagnosis Date  . Diabetes mellitus without complication   . Panic attack   . Hypertension   . Hypercholesteremia   . GERD (gastroesophageal reflux disease)   . Hypothyroid   . Overactive bladder   . Arthritis   . Anemia   . Shortness of breath     Past Surgical History  Procedure Laterality Date  . Tubal ligation    . Eye surgery Left   . Colonoscopy with esophagogastroduodenoscopy (egd) N/A 05/20/2013    Procedure: COLONOSCOPY WITH ESOPHAGOGASTRODUODENOSCOPY (EGD);  Surgeon: Rogene Houston, MD;  Location: [facility];  Service: Endoscopy;  Laterality: N/A;  730  . Cholecystectomy N/A 06/02/2013    Procedure: LAPAROSCOPIC CHOLECYSTECTOMY;  Surgeon: Scherry Ran, MD;  Location: [facility];  Service: General;  Laterality: N/A;    Family History: History reviewed. No pertinent family history. Social History:  History  Alcohol Use No     History  Drug Use No    History   Social History  . Marital Status: Divorced    Spouse Name: N/A  . Number of Children: N/A  . Years of Education: N/A   Social History Main Topics  . Smoking status: Former Smoker -- 2.00 packs/day for 30 years    Types: Cigarettes    Quit date: 05/20/2002  . Smokeless tobacco: Not on file  . Alcohol Use: No  . Drug Use: No  . Sexual Activity: Yes    Birth Control/ Protection: Post-menopausal   Other Topics Concern  . None   Social History Narrative   Additional History:    Sleep: Fair  Appetite:  Fair   Assessment:   Musculoskeletal: Strength & Muscle Tone: within normal limits Gait & Station: normal Patient leans: N/A   Psychiatric Specialty Exam: Physical Exam  Nursing note and vitals reviewed.   Review of Systems  Gastrointestinal: Positive for constipation.  All other systems reviewed and are negative.   Blood pressure 147/84, pulse 92, temperature 98.2 F (36.8 C), temperature source Oral, resp. rate 20, height 5' (1.524 m), weight 86.183 kg (190 lb), SpO2 100 %.Body mass index is 37.11 kg/(m^2).  General Appearance: Casual  Eye Contact::  Good  Speech:  Clear and Coherent and Pressured  Volume:  Increased  Mood:  Euphoric  Affect:  Labile  Thought Process:  Disorganized  Orientation:  Full (Time, Place, and Person)  Thought Content:  WDL  Suicidal  Thoughts:  No  Homicidal Thoughts:  No  Memory:  Immediate;   Fair Recent;   Fair Remote;   Fair  Judgement:  Fair  Insight:  Fair  Psychomotor Activity:  Normal  Concentration:  Fair  Recall:  [employer] of Knowledge:Fair  Language: Fair  Akathisia:  No  Handed:  Right  AIMS (if indicated):     Assets:  Communication Skills Desire for Improvement  ADL's:  Intact  Cognition: WNL  Sleep:  Number of Hours: 6     Current  Medications: Current Facility-Administered Medications  Medication Dose Route Frequency Provider Last Rate Last Dose  . acetaminophen (TYLENOL) tablet 650 mg  650 mg Oral Q6H PRN Hildred Priest, MD      . alum & mag hydroxide-simeth (MAALOX/MYLANTA) 200-200-20 MG/5ML suspension 30 mL  30 mL Oral Q4H PRN Hildred Priest, MD      . amLODipine (NORVASC) tablet 5 mg  5 mg Oral Daily Hildred Priest, MD   5 mg at 10/22/14 0853  . ARIPiprazole (ABILIFY) tablet 15 mg  15 mg Oral Daily Hildred Priest, MD   15 mg at 10/22/14 0852  . aspirin chewable tablet 81 mg  81 mg Oral Daily Jolanta B Pucilowska, MD   81 mg at 10/22/14 0852  . cholecalciferol (VITAMIN D) tablet 2,000 Units  2,000 Units Oral Daily Hildred Priest, MD   2,000 Units at 10/22/14 [phone]  . docusate sodium (COLACE) capsule 100 mg  100 mg Oral BID Clovis Fredrickson, MD   100 mg at 10/22/14 0852  . insulin aspart (novoLOG) injection 0-9 Units  0-9 Units Subcutaneous TID WC Hildred Priest, MD   1 Units at 10/22/14 1200  . insulin glargine (LANTUS) injection 9 Units  9 Units Subcutaneous QHS Hildred Priest, MD   9 Units at 10/21/14 2050  . iron polysaccharides (NIFEREX) capsule 150 mg  150 mg Oral Daily Hildred Priest, MD   150 mg at 10/22/14 0853  . magnesium hydroxide (MILK OF MAGNESIA) suspension 30 mL  30 mL Oral Daily PRN Hildred Priest, MD      . metFORMIN (GLUCOPHAGE) tablet 500 mg  500 mg Oral BID WC Hildred Priest, MD   500 mg at 10/22/14 0852  . oxybutynin (DITROPAN) tablet 5 mg  5 mg Oral QHS Hildred Priest, MD   5 mg at 10/21/14 2052  . pantoprazole (PROTONIX) EC tablet 40 mg  40 mg Oral QAC breakfast Hildred Priest, MD   40 mg at 10/22/14 0852  . polyethylene glycol (MIRALAX / GLYCOLAX) packet 17 g  17 g Oral Daily Jolanta B Pucilowska, MD   17 g at 10/22/14 0853  . pravastatin (PRAVACHOL) tablet 40 mg   40 mg Oral Daily Hildred Priest, MD   40 mg at 10/22/14 0854  . venlafaxine XR (EFFEXOR-XR) 24 hr capsule 75 mg  75 mg Oral Q breakfast Jolanta B Pucilowska, MD   75 mg at 10/22/14 0853  . vitamin B-12 (CYANOCOBALAMIN) tablet 1,000 mcg  1,000 mcg Oral Daily Hildred Priest, MD   1,000 mcg at 10/22/14 0853  . white petrolatum (VASELINE) gel   Topical PRN Gonzella Lex, MD      . zolpidem (AMBIEN) tablet 5 mg  5 mg Oral QHS PRN Hildred Priest, MD        Lab Results:  Results for orders placed or performed during the hospital encounter of 10/18/14 (from the past 48 hour(s))  Glucose, capillary     Status: Abnormal  Collection Time: 10/20/14  4:50 PM  Result Value Ref Range   Glucose-Capillary 128 (H) 65 - 99 mg/dL   Comment 1 Notify RN   Glucose, capillary     Status: Abnormal   Collection Time: 10/20/14  9:28 PM  Result Value Ref Range   Glucose-Capillary 182 (H) 65 - 99 mg/dL  Glucose, capillary     Status: Abnormal   Collection Time: 10/21/14  7:02 AM  Result Value Ref Range   Glucose-Capillary 121 (H) 65 - 99 mg/dL  Glucose, capillary     Status: None   Collection Time: 10/21/14 11:52 AM  Result Value Ref Range   Glucose-Capillary 92 65 - 99 mg/dL   Comment 1 Notify RN   Glucose, capillary     Status: Abnormal   Collection Time: 10/21/14  4:30 PM  Result Value Ref Range   Glucose-Capillary 100 (H) 65 - 99 mg/dL   Comment 1 Notify RN   Glucose, capillary     Status: Abnormal   Collection Time: 10/21/14  8:49 PM  Result Value Ref Range   Glucose-Capillary 103 (H) 65 - 99 mg/dL  Glucose, capillary     Status: Abnormal   Collection Time: 10/22/14  6:53 AM  Result Value Ref Range   Glucose-Capillary 115 (H) 65 - 99 mg/dL   Comment 1 Notify RN   Glucose, capillary     Status: Abnormal   Collection Time: 10/22/14 11:56 AM  Result Value Ref Range   Glucose-Capillary 121 (H) 65 - 99 mg/dL   Comment 1 Notify RN     Physical Findings: AIMS:  Facial and Oral Movements Muscles of Facial Expression: None, normal Lips and Perioral Area: None, normal Jaw: None, normal Tongue: None, normal,Extremity Movements Upper (arms, wrists, hands, fingers): None, normal Lower (legs, knees, ankles, toes): None, normal, Trunk Movements Neck, shoulders, hips: None, normal, Overall Severity Severity of abnormal movements (highest score from questions above): None, normal Incapacitation due to abnormal movements: None, normal Patient's awareness of abnormal movements (rate only patient's report): No Awareness, Dental Status Current problems with teeth and/or dentures?: Yes Does patient usually wear dentures?: Yes (at home)  CIWA:    COWS:     Treatment Plan Summary: Daily contact with patient to assess and evaluate symptoms and progress in treatment and Medication management   Medical Decision Making:  Established Problem, Stable/Improving (1), Review of Psycho-Social Stressors (1), Review or order clinical lab tests (1), Review of Medication Regimen & Side Effects (2) and Review of New Medication or Change in Dosage (40)   64 year old Caucasian female with history of depression. Patient presents to the hospital with a possibly new onset paranoia and suicidal ideation. The patient has been in 3 different hospitals over the last couple of weeks, [facility] and 2 days ago [facility]. Per family pt has always been paranoid but lately has become much worse and patient is not able to function. Patient has made clear persecutory statements to the family much more intense and severe than in the past. Family worries about the amount of medication patient is taking. They also wonder about the possibility of dementia.  Mania/psychosis: we lowered effexor XR to 75 mg po q day. Abilify 10 mg was started today. Diagnosis has been changed to bipolar disorder. Abilify will be increased to 15 mg po q day tomorrow  Insomnia: will  start ambien prn  HTN: continue norvas 5 mg po q day. If BP continues to be elevated over  the weekend please increase norvasc to 10 mg o q day.  Dyslipidemia: continue pravachol 40 mg po q day  DM: continue metformin 500 mg po bid. HbA1c >10 today. Nurse Diabetes specilist recommended lantus 9 mg qhs. CBG tid and supplemental insulin tid.   Vit B12 def: will check levels today. Continue b12 1000 mcg /day.   Vit D def: continue vit D 2000 U q day  Anemia: continue Fe So4 150 mg po q day  Urinary incontinence: continue ditropan 5 mg po qhs  GERD: continue PPI q day  Back pain: will consult PT as pt uses a back brace at home.Pending consult, they came to see pt yesterday while she was in radiology.  Constipation: Colace and Miralax.   Stroke prevention. ASA 81 mg.  Collateral info: contacted pt's family [phone] (son) and [phone] Brother  Labs: Lipid panel was wnl. Ammonia was 15. TSH was wnl. Vit B12 was wnl. Pending HIV, RPR.  Imaging: Brain MRI completed on 6/30---neg  10/22/2014 treatment plan and medication regimen was reviewed with the patient. No further changes were offered today.     Jolanta Pucilowska 10/22/2014, 2:44 PM

## 2014-10-23 LAB — GLUCOSE, CAPILLARY
GLUCOSE-CAPILLARY: 132 mg/dL — AB (ref 65–99)
GLUCOSE-CAPILLARY: 114 mg/dL — AB (ref 65–99)
Glucose-Capillary: 117 mg/dL — ABNORMAL HIGH (ref 65–99)
Glucose-Capillary: 121 mg/dL — ABNORMAL HIGH (ref 65–99)

## 2014-10-23 MED ORDER — ARIPIPRAZOLE 15 MG PO TABS: 15.0000 mg | ORAL_TABLET | Freq: Every day | ORAL | Status: DC

## 2014-10-23 NOTE — BHH Group Notes (Signed)
[facility] LCSW Group Therapy  10/23/2014 2:10 PM  Type of Therapy:  Group Therapy  Participation Level:  Active  Participation Quality:  Appropriate and Attentive  Affect:  Appropriate  Cognitive:  Alert, Appropriate and Oriented  Insight:  Engaged  Engagement in Therapy:  Engaged  Modes of Intervention:  Education, Socialization and Support  Summary of Progress/Problems:  Patient attended and participated in group discussion appropriately. Patient shared that her best Independence Day Celebration memory is "cookouts and visiting [facility] for vacation".   Keene Breath, MSW, LCSWA 10/23/2014, 2:10 PM

## 2014-10-23 NOTE — Progress Notes (Signed)
Patient is alert and oriented x 4. Her mood is euthymic with congruent affect. Denies SI or HI. No evidence of psychotic thinking. Speech is clear and coherent, thoughts are organized. Patient reports she feels " much better."  Patient is visible on the unit, attending groups, and social with select peers. She is taking all medications as directed, and is not reporting any adverse effects. Will continue with current treatment plan, maintain safety precautions.

## 2014-10-23 NOTE — B[facility] Group Notes (Signed)
[facility] Group Notes:  (Nursing/MHT/Case Management/Adjunct)  Date:  10/23/2014  Time:  12:16 AM  Type of Therapy:  Group Therapy  Participation Level:  Active  Participation Quality:  Appropriate  Affect:  Appropriate  Cognitive:  Appropriate  Insight:  Appropriate  Engagement in Group:  Engaged  Modes of Intervention:  Discussion  Summary of Progress/Problems:  Gresham Caetano Joy Arad Burston 10/23/2014, 12:16 AM

## 2014-10-23 NOTE — Progress Notes (Signed)
D: Pt denies SI/HI/AVH. Pt is pleasant and cooperative. Pt stated 'she feels better from taking medication" patient appears less anxious speech is non pressured non hyper verbal, thoughts are organized and she is interacting with peers and staff appropriately.  A: Pt was offered support and encouragement. Pt was given scheduled medications. Pt was encouraged to attend groups. Q 15 minute checks were done for safety.  R:Pt attends groups and interacts well with peers and staff. Pt is taking medication. Pt has no complaints.Pt receptive to treatment and safety maintained on unit.

## 2014-10-23 NOTE — [facility] Group Notes (Signed)
[facility] Group Notes:  (Nursing/MHT/Case Management/Adjunct)  Date:  10/23/2014  Time:  10:40 PM  Type of Therapy:  Psychoeducational Skills  Participation Level:  Active  Participation Quality:  Appropriate, Attentive and Sharing  Affect:  Appropriate  Cognitive:  Alert, Appropriate and Oriented  Insight:  Appropriate and Good  Engagement in Group:  Engaged  Modes of Intervention:  Discussion and Exploration  Summary of Progress/Problems:  [name] 10/23/2014, 10:40 PM

## 2014-10-23 NOTE — Progress Notes (Signed)
[facility] MD Progress Note  10/23/2014 2:26 PM [name]  MRN:  [id number]  Subjective:  Mrs. Erika Jacobs continues to improve. She seems much more relaxed and in control of her behavior. She is no more intrusive or excessively talkative. She is able to focus on problems at hand. She is quite good negotiating changes in her medicines. For example she feels that Abilify in the morning makes her feel weird and that she would not do better to drive her car. She is asking to move Abilify to nighttime tomorrow. She still plans to stay in [place] area at least for a period of time following discharge to make sure that she is in good control of her behavior before she goes home where she lives along. Sleep and appetite are good. There are no somatic complaints. She denies any psychotic symptoms. She is active in groups.  Principal Problem: Severe manic bipolar 1 disorder with psychotic behavior Diagnosis:   Patient Active Problem List   Diagnosis Date Noted  . Severe manic bipolar 1 disorder with psychotic behavior [F31.2] 10/20/2014  . Dyslipidemia [E78.5] 10/18/2014  . GERD (gastroesophageal reflux disease) [K21.9] 10/18/2014  . Hypothyroidism [E03.9] 10/18/2014  . Diabetes [E11.9] 10/17/2014  . Hypertension [I10] 10/17/2014  . Urinary incontinence [R32] 10/17/2014  . Suicidal ideation [R45.851]   . Cholecystitis with cholelithiasis [K80.10] 06/02/2013   Total Time spent with patient: 20 minutes   Past Medical History:  Past Medical History  Diagnosis Date  . Diabetes mellitus without complication   . Panic attack   . Hypertension   . Hypercholesteremia   . GERD (gastroesophageal reflux disease)   . Hypothyroid   . Overactive bladder   . Arthritis   . Anemia   . Shortness of breath     Past Surgical History  Procedure Laterality Date  . Tubal ligation    . Eye surgery Left   . Colonoscopy with esophagogastroduodenoscopy (egd) N/A 05/20/2013    Procedure: COLONOSCOPY WITH  ESOPHAGOGASTRODUODENOSCOPY (EGD);  Surgeon: Rogene Houston, MD;  Location: [facility];  Service: Endoscopy;  Laterality: N/A;  730  . Cholecystectomy N/A 06/02/2013    Procedure: LAPAROSCOPIC CHOLECYSTECTOMY;  Surgeon: Scherry Ran, MD;  Location: [facility] ORS;  Service: General;  Laterality: N/A;   Family History: History reviewed. No pertinent family history. Social History:  History  Alcohol Use No     History  Drug Use No    History   Social History  . Marital Status: Divorced    Spouse Name: N/A  . Number of Children: N/A  . Years of Education: N/A   Social History Main Topics  . Smoking status: Former Smoker -- 2.00 packs/day for 30 years    Types: Cigarettes    Quit date: 05/20/2002  . Smokeless tobacco: Not on file  . Alcohol Use: No  . Drug Use: No  . Sexual Activity: Yes    Birth Control/ Protection: Post-menopausal   Other Topics Concern  . None   Social History Narrative   Additional History:    Sleep: Good  Appetite:  Good   Assessment:   Musculoskeletal: Strength & Muscle Tone: within normal limits Gait & Station: normal Patient leans: N/A   Psychiatric Specialty Exam: Physical Exam  Nursing note and vitals reviewed.   Review of Systems  All other systems reviewed and are negative.   Blood pressure 133/83, pulse 84, temperature 98.5 F (36.9 C), temperature source Oral, resp. rate 20, height 5' (1.524 m), weight 86.183  kg (190 lb), SpO2 100 %.Body mass index is 37.11 kg/(m^2).  General Appearance: Casual  Eye Contact::  Good  Speech:  Normal Rate  Volume:  Normal  Mood:  Euthymic  Affect:  Appropriate  Thought Process:  Goal Directed  Orientation:  Full (Time, Place, and Person)  Thought Content:  WDL  Suicidal Thoughts:  No  Homicidal Thoughts:  No  Memory:  Immediate;   Fair Recent;   Fair Remote;   Fair  Judgement:  Fair  Insight:  Fair  Psychomotor Activity:  Normal  Concentration:  Fair  Recall:  [employer] of  Jacobs  Language: Fair  Akathisia:  No  Handed:  Right  AIMS (if indicated):     Assets:  Communication Skills Desire for Improvement Financial Resources/Insurance Housing Physical Health Resilience Social Support  ADL's:  Intact  Cognition: WNL  Sleep:  Number of Hours: 6     Current Medications: Current Facility-Administered Medications  Medication Dose Route Frequency Provider Last Rate Last Dose  . acetaminophen (TYLENOL) tablet 650 mg  650 mg Oral Q6H PRN Hildred Priest, MD      . alum & mag hydroxide-simeth (MAALOX/MYLANTA) 200-200-20 MG/5ML suspension 30 mL  30 mL Oral Q4H PRN Hildred Priest, MD      . amLODipine (NORVASC) tablet 5 mg  5 mg Oral Daily Hildred Priest, MD   5 mg at 10/23/14 0929  . [START ON 10/25/2014] ARIPiprazole (ABILIFY) tablet 15 mg  15 mg Oral QHS Juquan Reznick B Verba Ainley, MD      . aspirin chewable tablet 81 mg  81 mg Oral Daily Roiza Wiedel B Sahid Borba, MD   81 mg at 10/23/14 0929  . cholecalciferol (VITAMIN D) tablet 2,000 Units  2,000 Units Oral Daily Hildred Priest, MD   2,000 Units at 10/23/14 [phone]  . docusate sodium (COLACE) capsule 100 mg  100 mg Oral BID Clovis Fredrickson, MD   100 mg at 10/23/14 0934  . insulin aspart (novoLOG) injection 0-9 Units  0-9 Units Subcutaneous TID WC Hildred Priest, MD   1 Units at 10/23/14 0830  . insulin glargine (LANTUS) injection 9 Units  9 Units Subcutaneous QHS Hildred Priest, MD   9 Units at 10/22/14 2119  . iron polysaccharides (NIFEREX) capsule 150 mg  150 mg Oral Daily Hildred Priest, MD   150 mg at 10/23/14 0934  . magnesium hydroxide (MILK OF MAGNESIA) suspension 30 mL  30 mL Oral Daily PRN Hildred Priest, MD      . metFORMIN (GLUCOPHAGE) tablet 500 mg  500 mg Oral BID WC Hildred Priest, MD   500 mg at 10/23/14 0830  . oxybutynin (DITROPAN) tablet 5 mg  5 mg Oral QHS Hildred Priest, MD   5 mg at  10/22/14 2119  . pantoprazole (PROTONIX) EC tablet 40 mg  40 mg Oral QAC breakfast Hildred Priest, MD   40 mg at 10/23/14 0830  . polyethylene glycol (MIRALAX / GLYCOLAX) packet 17 g  17 g Oral Daily Cherlynn Popiel B Kristena Wilhelmi, MD   17 g at 10/22/14 0853  . pravastatin (PRAVACHOL) tablet 40 mg  40 mg Oral Daily Hildred Priest, MD   40 mg at 10/23/14 [id number]  . venlafaxine XR (EFFEXOR-XR) 24 hr capsule 75 mg  75 mg Oral Q breakfast Bevin Das B Tishie Altmann, MD   75 mg at 10/23/14 0830  . vitamin B-12 (CYANOCOBALAMIN) tablet 1,000 mcg  1,000 mcg Oral Daily Hildred Priest, MD   1,000 mcg at 10/23/14 [id number]  . white petrolatum (VASELINE)  gel   Topical PRN Gonzella Lex, MD      . zolpidem (AMBIEN) tablet 5 mg  5 mg Oral QHS PRN Hildred Priest, MD        Lab Results:  Results for orders placed or performed during the hospital encounter of 10/18/14 (from the past 48 hour(s))  Glucose, capillary     Status: Abnormal   Collection Time: 10/21/14  4:30 PM  Result Value Ref Range   Glucose-Capillary 100 (H) 65 - 99 mg/dL   Comment 1 Notify RN   Glucose, capillary     Status: Abnormal   Collection Time: 10/21/14  8:49 PM  Result Value Ref Range   Glucose-Capillary 103 (H) 65 - 99 mg/dL  Glucose, capillary     Status: Abnormal   Collection Time: 10/22/14  6:53 AM  Result Value Ref Range   Glucose-Capillary 115 (H) 65 - 99 mg/dL   Comment 1 Notify RN   Glucose, capillary     Status: Abnormal   Collection Time: 10/22/14 11:56 AM  Result Value Ref Range   Glucose-Capillary 121 (H) 65 - 99 mg/dL   Comment 1 Notify RN   Glucose, capillary     Status: Abnormal   Collection Time: 10/22/14  4:33 PM  Result Value Ref Range   Glucose-Capillary 116 (H) 65 - 99 mg/dL  Glucose, capillary     Status: Abnormal   Collection Time: 10/22/14  8:20 PM  Result Value Ref Range   Glucose-Capillary 133 (H) 65 - 99 mg/dL   Comment 1 Notify RN   Glucose, capillary     Status: Abnormal    Collection Time: 10/23/14  6:53 AM  Result Value Ref Range   Glucose-Capillary 132 (H) 65 - 99 mg/dL  Glucose, capillary     Status: Abnormal   Collection Time: 10/23/14 12:02 PM  Result Value Ref Range   Glucose-Capillary 117 (H) 65 - 99 mg/dL   Comment 1 Notify RN     Physical Findings: AIMS: Facial and Oral Movements Muscles of Facial Expression: None, normal Lips and Perioral Area: None, normal Jaw: None, normal Tongue: None, normal,Extremity Movements Upper (arms, wrists, hands, fingers): None, normal Lower (legs, knees, ankles, toes): None, normal, Trunk Movements Neck, shoulders, hips: None, normal, Overall Severity Severity of abnormal movements (highest score from questions above): None, normal Incapacitation due to abnormal movements: None, normal Patient's awareness of abnormal movements (rate only patient's report): No Awareness, Dental Status Current problems with teeth and/or dentures?: Yes Does patient usually wear dentures?: Yes (at home)  CIWA:    COWS:     Treatment Plan Summary: Daily contact with patient to assess and evaluate symptoms and progress in treatment and Medication management   Medical Decision Making:  Established Problem, Stable/Improving (1), Review of Psycho-Social Stressors (1), Review or order clinical lab tests (1), Review of Medication Regimen & Side Effects (2) and Review of New Medication or Change in Dosage (32)   64 year old Caucasian female with history of depression. Patient presents to the hospital with a possibly new onset paranoia and suicidal ideation. The patient has been in 3 different hospitals over the last couple of weeks, [facility] and 2 days ago [facility]. Per family pt has always been paranoid but lately has become much worse and patient is not able to function. Patient has made clear persecutory statements to the family much more intense and severe than in the past. Family worries about the  amount of medication patient  is taking. They also wonder about the possibility of dementia.  Mania/psychosis: we lowered effexor XR to 75 mg po q day. Abilify 10 mg was started today. Diagnosis has been changed to bipolar disorder. Abilify was increased to 15 mg po nightly beginning 10/24/2014.  Insomnia: will start ambien prn  HTN: continue norvas 5 mg po q day. If BP continues to be elevated over the weekend please increase norvasc to 10 mg o q day.  Dyslipidemia: continue pravachol 40 mg po q day  DM: continue metformin 500 mg po bid. HbA1c >10 today. Nurse Diabetes specilist recommended lantus 9 mg qhs. CBG tid and supplemental insulin tid.   Vit B12 def: will check levels today. Continue b12 1000 mcg /day.   Vit D def: continue vit D 2000 U q day  Anemia: continue Fe So4 150 mg po q day  Urinary incontinence: continue ditropan 5 mg po qhs  GERD: continue PPI q day  Back pain: will consult PT as pt uses a back brace at home.Pending consult, they came to see pt yesterday while she was in radiology.  Constipation: Colace and Miralax.   Stroke prevention. ASA 81 mg.  Collateral info: contacted pt's family [phone] (son) and [phone] Brother  Labs: Lipid panel was wnl. Ammonia was 15. TSH was wnl. Vit B12 was wnl. Pending HIV, RPR.  Imaging: Brain MRI completed on 6/30---neg  10/23/2014 treatment plan and medication regimen was reviewed with the patient.       Jackquelyn Sundberg 10/23/2014, 2:26 PM

## 2014-10-23 NOTE — Plan of Care (Signed)
Problem: Alteration in mood Goal: LTG-Pt's behavior demonstrates decreased signs of depression (Patient's behavior demonstrates decreased signs of depression to the point the patient is safe to return home and continue treatment in an outpatient setting)  Outcome: Progressing Patient interacting with peers, attending group meeting , appears less anxious.

## 2014-10-24 LAB — GLUCOSE, CAPILLARY
Glucose-Capillary: 142 mg/dL — ABNORMAL HIGH (ref 65–99)
Glucose-Capillary: 125 mg/dL — ABNORMAL HIGH (ref 65–99)
Glucose-Capillary: 146 mg/dL — ABNORMAL HIGH (ref 65–99)
Glucose-Capillary: 118 mg/dL — ABNORMAL HIGH (ref 65–99)

## 2014-10-24 MED ORDER — VENLAFAXINE HCL ER 75 MG PO CP24: 75.0000 mg | ORAL_CAPSULE | Freq: Every day | ORAL | Status: DC

## 2014-10-24 MED ORDER — ZOLPIDEM TARTRATE 5 MG PO TABS: 5.0000 mg | ORAL_TABLET | Freq: Every evening | ORAL | Status: DC | PRN

## 2014-10-24 MED ORDER — AMLODIPINE BESYLATE 5 MG PO TABS: 5.0000 mg | ORAL_TABLET | Freq: Every day | ORAL | Status: DC

## 2014-10-24 MED ORDER — INSULIN GLARGINE 100 UNIT/ML ~~LOC~~ SOLN: 9.0000 [IU] | Freq: Every day | SUBCUTANEOUS | Status: DC

## 2014-10-24 MED ORDER — ARIPIPRAZOLE 15 MG PO TABS: 15.0000 mg | ORAL_TABLET | Freq: Every day | ORAL | Status: DC

## 2014-10-24 MED ORDER — ZOLPIDEM TARTRATE 5 MG PO TABS
5.0000 mg | ORAL_TABLET | Freq: Every day | ORAL | Status: DC
  Administered 2014-10-24: 5 mg via ORAL
  Filled 2014-10-24: qty 1

## 2014-10-24 NOTE — Plan of Care (Signed)
Problem: Alteration in mood Goal: LTG-Patient reports reduction in suicidal thoughts (Patient reports reduction in suicidal thoughts and is able to verbalize a safety plan for whenever patient is feeling suicidal)  Outcome: Progressing denies

## 2014-10-24 NOTE — [facility] Group Notes (Signed)
[facility] Group Notes:  (Nursing/MHT/Case Management/Adjunct)  Date:  10/24/2014  Time:  12:18 PM  Type of Therapy:  Psychoeducational Skills  Participation Level:  Active  Participation Quality:  Appropriate  Affect:  Appropriate  Cognitive:  Appropriate  Insight:  Appropriate  Engagement in Group:  Engaged  Modes of Intervention:  Discussion and Education  Summary of Progress/Problems:  Drake Leach 10/24/2014, 12:18 PM

## 2014-10-24 NOTE — Progress Notes (Signed)
Patient is cooperative and oriented x 4. Her mood remains euthymic with congruent affect. Denies SI or HI. No evidence of psychotic thinking. Speech is clear and coherent, thoughts are organized. Patient reports she feels " much better." Patient is visible on the unit, attending groups, and social with select peers. She is taking all medications as directed, and is not reporting any adverse effects. Will continue with current treatment plan, maintain safety precautions.

## 2014-10-24 NOTE — [facility] Group Notes (Signed)
[place] Group Notes:  (Nursing/MHT/Case Management/Adjunct)  Date:  10/24/2014  Time:  10:17 PM  Type of Therapy:  Group Therapy  Participation Level:  Active  Participation Quality:  Appropriate and Attentive  Affect:  Appropriate  Cognitive:  Alert and Appropriate  Insight:  Appropriate  Engagement in Group:  Engaged  Modes of Intervention:  Discussion  Summary of Progress/Problems:  Tatelyn Vanhecke Joy Climmie Buelow 10/24/2014, 10:17 PM

## 2014-10-24 NOTE — Progress Notes (Signed)
Physical Therapy Treatment Patient Details Name: Erika Jacobs MRN: [id number] DOB: 1950/11/19 Today's Date: 10/24/2014    History of Present Illness 64 yo female with onset of LBP from chronic issues, admitted for depression and suicidal ideation with symptoms resolving.     PT Comments    Pt appears independent with HEP (pt initially requiring a couple vc's for cervical ex's but then pt was independent with cervical and lumbar stretching HEP).  Pt 0/10 pain beginning and end of session.  No further acute PT needs identified.  Will complete PT order (discharge pt in house).  Follow Up Recommendations  No PT follow up     Equipment Recommendations  None recommended by PT    Recommendations for Other Services       Precautions / Restrictions Precautions Precautions: None Restrictions Weight Bearing Restrictions: No    Mobility  Bed Mobility Overal bed mobility: Independent                Transfers Overall transfer level: Modified independent Equipment used:  (with RW and no AD)                Ambulation/Gait Ambulation/Gait assistance: Modified independent (Device/Increase time) Ambulation Distance (Feet): 150 Feet Assistive device: Rolling walker (2 wheeled) Gait Pattern/deviations: WFL(Within Functional Limits) Gait velocity: normal       Stairs            Wheelchair Mobility    Modified Rankin (Stroke Patients Only)       Balance                                    Cognition Arousal/Alertness: Awake/alert Behavior During Therapy: WFL for tasks assessed/performed Overall Cognitive Status: Within Functional Limits for tasks assessed                      Exercises Other Exercises Other Exercises: Lumbar flexion (standing) x15 seconds Other Exercises: Lumbar rotation (sitting) x15 seconds Other Exercises: Lumbar side bend (standing) x15 seconds Other Exercises: Cervical rotation (sitting) x15 seconds Other  Exercises: Cervical sidebend (sitting) x15 seconds    General Comments  Nursing cleared pt for participation in PT.  Pt agreeable to PT session.      Pertinent Vitals/Pain Pain Assessment: 0-10 Pain Score: 2  (0/10 at rest; 2/10 with stretching L posterior lateral portion of neck) Pain Location: L posterior lateral portion of neck Pain Intervention(s): Limited activity within patient's tolerance;Monitored during session    Home Living                      Prior Function            PT Goals (current goals can now be found in the care plan section) Acute Rehab PT Goals Patient Stated Goal: to understand ex's PT Goal Formulation: With patient Time For Goal Achievement: 10/27/14 Potential to Achieve Goals: Good Progress towards PT goals: Goals met/education completed, patient discharged from PT    Frequency  Other (Comment) (Discharge pt in house)    PT Plan Frequency needs to be updated (Discharge pt in house)    Co-evaluation             End of Session   Activity Tolerance: Patient tolerated treatment well (Pt reporting 0/10 pain end of session) Patient left:  (in room with RW)     Time: 6629-4765 PT Time  Calculation (min) (ACUTE ONLY): 13 min  Charges:  $Therapeutic Exercise: 8-22 mins                    G CodesLeitha Bleak 11/08/14, 3:04 PM Leitha Bleak, [place]

## 2014-10-24 NOTE — Progress Notes (Signed)
[facility] MD Progress Note  10/24/2014 3:46 PM Erika Jacobs  MRN:  [id number]  Subjective:  [name] continues to improve. She reports not feeling that her thoughts are racing or feeling jittery. He reports that the Abilify was making her tired and sedated and therefore the doctor on call over the weekend changing the Abilify to evening, tonight will eat her first dose of Abilify daily at bedtime. Per nursing she has not been sleeping well. Last night she slept only 3 hours.  Patient has been compliant with her medication and is not as argumentative about them.  Her speech is no longer pressure and she does not appear to have as much psychomotor agitation. Patient denies side effects from medications other than having some headaches. She denies any other physical complaints. She denies SI, HI or having auditory or visual hallucinations. She denies major problems with her appetite and she does describe having low energy which she thinks is secondary to taking the Abilify in the morning and since admission has been having problems with insomnia.  Principal Problem: Severe manic bipolar 1 disorder with psychotic behavior Diagnosis:   Patient Active Problem List   Diagnosis Date Noted  . Severe manic bipolar 1 disorder with psychotic behavior [F31.2] 10/20/2014  . Dyslipidemia [E78.5] 10/18/2014  . GERD (gastroesophageal reflux disease) [K21.9] 10/18/2014  . Hypothyroidism [E03.9] 10/18/2014  . Diabetes [E11.9] 10/17/2014  . Hypertension [I10] 10/17/2014  . Urinary incontinence [R32] 10/17/2014  . Suicidal ideation [R45.851]   . Cholecystitis with cholelithiasis [K80.10] 06/02/2013   Total Time spent with patient: 30 minutes   Past Medical History:  Past Medical History  Diagnosis Date  . Diabetes mellitus without complication   . Panic attack   . Hypertension   . Hypercholesteremia   . GERD (gastroesophageal reflux disease)   . Hypothyroid   . Overactive bladder   . Arthritis   .  Anemia   . Shortness of breath     Past Surgical History  Procedure Laterality Date  . Tubal ligation    . Eye surgery Left   . Colonoscopy with esophagogastroduodenoscopy (egd) N/A 05/20/2013    Procedure: COLONOSCOPY WITH ESOPHAGOGASTRODUODENOSCOPY (EGD);  Surgeon: Rogene Houston, MD;  Location: [facility];  Service: Endoscopy;  Laterality: N/A;  730  . Cholecystectomy N/A 06/02/2013    Procedure: LAPAROSCOPIC CHOLECYSTECTOMY;  Surgeon: Scherry Ran, MD;  Location: [facility] ORS;  Service: General;  Laterality: N/A;   Family History: History reviewed. No pertinent family history. Social History:  History  Alcohol Use No     History  Drug Use No    History   Social History  . Marital Status: Divorced    Spouse Name: N/A  . Number of Children: N/A  . Years of Education: N/A   Social History Main Topics  . Smoking status: Former Smoker -- 2.00 packs/day for 30 years    Types: Cigarettes    Quit date: 05/20/2002  . Smokeless tobacco: Not on file  . Alcohol Use: No  . Drug Use: No  . Sexual Activity: Yes    Birth Control/ Protection: Post-menopausal   Other Topics Concern  . None   Social History Narrative   Additional History:    Sleep: Good  Appetite:  Good   Assessment:   Musculoskeletal: Strength & Muscle Tone: within normal limits Gait & Station: normal Patient leans: N/A   Psychiatric Specialty Exam: Physical Exam  Nursing note and vitals reviewed.   Review of Systems  Constitutional: Negative.   Eyes: Negative.   Respiratory: Negative.   Cardiovascular: Negative.   Gastrointestinal: Negative.   Genitourinary: Negative.   Musculoskeletal: Negative.   Skin: Negative.   Neurological: Positive for headaches.  Endo/Heme/Allergies: Negative.   Psychiatric/Behavioral: Negative for depression, suicidal ideas and hallucinations. The patient has insomnia.   All other systems reviewed and are negative.   Blood pressure 117/74, pulse 88,  temperature 97.9 F (36.6 C), temperature source Oral, resp. rate 20, height 5' (1.524 m), weight 86.183 kg (190 lb), SpO2 100 %.Body mass index is 37.11 kg/(m^2).  General Appearance: Casual  Eye Contact::  Good  Speech:  Normal Rate  Volume:  Normal  Mood:  Euthymic  Affect:  Appropriate  Thought Process:  Goal Directed  Orientation:  Full (Time, Place, and Person)  Thought Content:  WDL  Suicidal Thoughts:  No  Homicidal Thoughts:  No  Memory:  Immediate;   Fair Recent;   Fair Remote;   Fair  Judgement:  Fair  Insight:  Fair  Psychomotor Activity:  Normal  Concentration:  Fair  Recall:  [employer] [place]  Language: Fair  Akathisia:  No  Handed:  Right  AIMS (if indicated):     Assets:  Communication Skills Desire for Improvement Financial Resources/Insurance Housing Physical Health Resilience Social Support  ADL's:  Intact  Cognition: WNL  Sleep:  Number of Hours: 3     Current Medications: Current Facility-Administered Medications  Medication Dose Route Frequency Provider Last Rate Last Dose  . acetaminophen (TYLENOL) tablet 650 mg  650 mg Oral Q6H PRN Hildred Priest, MD      . alum & mag hydroxide-simeth (MAALOX/MYLANTA) 200-200-20 MG/5ML suspension 30 mL  30 mL Oral Q4H PRN Hildred Priest, MD      . amLODipine (NORVASC) tablet 5 mg  5 mg Oral Daily Hildred Priest, MD   5 mg at 10/24/14 0945  . [START ON 10/25/2014] ARIPiprazole (ABILIFY) tablet 15 mg  15 mg Oral QHS Jolanta B Pucilowska, MD      . aspirin chewable tablet 81 mg  81 mg Oral Daily Jolanta B Pucilowska, MD   81 mg at 10/24/14 0945  . cholecalciferol (VITAMIN D) tablet 2,000 Units  2,000 Units Oral Daily Hildred Priest, MD   2,000 Units at 10/24/14 0945  . docusate sodium (COLACE) capsule 100 mg  100 mg Oral BID Clovis Fredrickson, MD   100 mg at 10/24/14 0945  . insulin aspart (novoLOG) injection 0-9 Units  0-9 Units Subcutaneous TID WC Hildred Priest, MD   1 Units at 10/24/14 1154  . insulin glargine (LANTUS) injection 9 Units  9 Units Subcutaneous QHS Hildred Priest, MD   9 Units at 10/23/14 2126  . iron polysaccharides (NIFEREX) capsule 150 mg  150 mg Oral Daily Hildred Priest, MD   150 mg at 10/24/14 0945  . magnesium hydroxide (MILK OF MAGNESIA) suspension 30 mL  30 mL Oral Daily PRN Hildred Priest, MD      . metFORMIN (GLUCOPHAGE) tablet 500 mg  500 mg Oral BID WC Hildred Priest, MD   500 mg at 10/24/14 [id number]  . oxybutynin (DITROPAN) tablet 5 mg  5 mg Oral QHS Hildred Priest, MD   5 mg at 10/23/14 2126  . pantoprazole (PROTONIX) EC tablet 40 mg  40 mg Oral QAC breakfast Hildred Priest, MD   40 mg at 10/24/14 [id number]  . polyethylene glycol (MIRALAX / GLYCOLAX) packet 17 g  17 g Oral Daily Jolanta B  Pucilowska, MD   17 g at 10/24/14 0944  . pravastatin (PRAVACHOL) tablet 40 mg  40 mg Oral Daily Hildred Priest, MD   40 mg at 10/24/14 0945  . venlafaxine XR (EFFEXOR-XR) 24 hr capsule 75 mg  75 mg Oral Q breakfast Jolanta B Pucilowska, MD   75 mg at 10/24/14 0832  . vitamin B-12 (CYANOCOBALAMIN) tablet 1,000 mcg  1,000 mcg Oral Daily Hildred Priest, MD   1,000 mcg at 10/24/14 0945  . white petrolatum (VASELINE) gel   Topical PRN Gonzella Lex, MD      . zolpidem (AMBIEN) tablet 5 mg  5 mg Oral QHS Hildred Priest, MD        Lab Results:  Results for orders placed or performed during the hospital encounter of 10/18/14 (from the past 48 hour(s))  Glucose, capillary     Status: Abnormal   Collection Time: 10/22/14  4:33 PM  Result Value Ref Range   Glucose-Capillary 116 (H) 65 - 99 mg/dL  Glucose, capillary     Status: Abnormal   Collection Time: 10/22/14  8:20 PM  Result Value Ref Range   Glucose-Capillary 133 (H) 65 - 99 mg/dL   Comment 1 Notify RN   Glucose, capillary     Status: Abnormal   Collection Time: 10/23/14  6:53  AM  Result Value Ref Range   Glucose-Capillary 132 (H) 65 - 99 mg/dL  Glucose, capillary     Status: Abnormal   Collection Time: 10/23/14 12:02 PM  Result Value Ref Range   Glucose-Capillary 117 (H) 65 - 99 mg/dL   Comment 1 Notify RN   Glucose, capillary     Status: Abnormal   Collection Time: 10/23/14  4:24 PM  Result Value Ref Range   Glucose-Capillary 114 (H) 65 - 99 mg/dL   Comment 1 Notify RN   Glucose, capillary     Status: Abnormal   Collection Time: 10/23/14  8:09 PM  Result Value Ref Range   Glucose-Capillary 121 (H) 65 - 99 mg/dL   Comment 1 Notify RN   Glucose, capillary     Status: Abnormal   Collection Time: 10/24/14  6:47 AM  Result Value Ref Range   Glucose-Capillary 142 (H) 65 - 99 mg/dL  Glucose, capillary     Status: Abnormal   Collection Time: 10/24/14 11:53 AM  Result Value Ref Range   Glucose-Capillary 125 (H) 65 - 99 mg/dL   Comment 1 Notify RN     Physical Findings: AIMS: Facial and Oral Movements Muscles of Facial Expression: None, normal Lips and Perioral Area: None, normal Jaw: None, normal Tongue: None, normal,Extremity Movements Upper (arms, wrists, hands, fingers): None, normal Lower (legs, knees, ankles, toes): None, normal, Trunk Movements Neck, shoulders, hips: None, normal, Overall Severity Severity of abnormal movements (highest score from questions above): None, normal Incapacitation due to abnormal movements: None, normal Patient's awareness of abnormal movements (rate only patient's report): No Awareness, Dental Status Current problems with teeth and/or dentures?: Yes Does patient usually wear dentures?: Yes (at home)  CIWA:    COWS:     Treatment Plan Summary: Daily contact with patient to assess and evaluate symptoms and progress in treatment and Medication management   Medical Decision Making:  Established Problem, Stable/Improving (1), Review of Psycho-Social Stressors (1), Review or order clinical lab tests (1), Review of  Medication Regimen & Side Effects (2) and Review of New Medication or Change in Dosage (68)   64 year old Caucasian female with history of depression.  Patient presents to the hospital with a possibly new onset paranoia and suicidal ideation. The patient has been in 3 different hospitals over the last couple of weeks, [facility] and 2 days ago [facility]. Per family pt has always been paranoid but lately has become much worse and patient is not able to function. Patient has made clear persecutory statements to the family much more intense and severe than in the past. Family worries about the amount of medication patient is taking. They also wonder about the possibility of dementia.  Mania/psychosis: Continue Abilify 15 mg by mouth daily at bedtime, tonight will be her first dose of Abilify in the evening as she has been reporting that the Abilify in the morning next or sedated. Effexor has been decreased to 75 mg by mouth daily  Insomnia: Will change the Ambien to daily at bedtime.  HTN: continue norvas 5 mg po q day.   Dyslipidemia: continue pravachol 40 mg po q day  DM: continue metformin 500 mg po bid. HbA1c >10 today. Nurse Diabetes specilist recommended lantus 9 mg qhs. CBG tid and supplemental insulin tid.   Vit B12 def: Levels of B12 were within the normal limits. Continue b12 1000 mcg /day.   Vit D def: continue vit D 2000 U q day  Anemia: continue Fe So4 150 mg po q day  Urinary incontinence: continue ditropan 5 mg po qhs  GERD: continue PPI q day  Back pain: PT seein the pt: "Pt will be seen for one more visit to review simple stretches which pt wrote for herself as instructed by PT. Will check on her to see that she is doing well and will not need further instruction as her initial instruction was repetitive. Will likely be ready to DC "  Constipation: Colace and Miralax.   Stroke prevention. ASA 81 mg.  Collateral info: contacted pt's  family [phone] (son) and [phone] Brother.  Plan to contact family to arrange for discharge likely tomorrow.  Labs: Lipid panel was wnl. Ammonia was 15. TSH was wnl. Vit B12 was wnl. Pending HIV, RPR.  Imaging: Brain MRI completed on 6/30---neg      Hildred Priest 10/24/2014, 3:46 PM

## 2014-10-24 NOTE — Plan of Care (Signed)
Problem: Alteration in mood Goal: LTG-Pt's behavior demonstrates decreased signs of depression (Patient's behavior demonstrates decreased signs of depression to the point the patient is safe to return home and continue treatment in an outpatient setting)  Outcome: Progressing Denies depression

## 2014-10-24 NOTE — Progress Notes (Signed)
Medication and group compliant.  Interacting well with peers.  Denies SI or depression.

## 2014-10-25 LAB — RPR
RPR Ser Ql: NONREACTIVE
RPR Ser Ql: NONREACTIVE

## 2014-10-25 LAB — HIV-1 RNA QUANT-NO REFLEX-BLD: HIV 1 RNA QUANT: UNDETERMINED {copies}/mL

## 2014-10-25 LAB — HIV ANTIBODY (ROUTINE TESTING W REFLEX): HIV SCREEN 4TH GENERATION: NONREACTIVE

## 2014-10-25 LAB — GLUCOSE, CAPILLARY: Glucose-Capillary: 132 mg/dL — ABNORMAL HIGH (ref 65–99)

## 2014-10-25 NOTE — Plan of Care (Signed)
Problem: Alteration in mood Goal: LTG-Patient reports reduction in suicidal thoughts (Patient reports reduction in suicidal thoughts and is able to verbalize a safety plan for whenever patient is feeling suicidal)  Outcome: Progressing Patient denies SI/HI interacting appropriately.

## 2014-10-25 NOTE — Progress Notes (Signed)
[facility] H&P Discharge Summary faxed to [facility] for hospital follow-up

## 2014-10-25 NOTE — BHH Suicide Risk Assessment (Signed)
[facility] Discharge Suicide Risk Assessment   Demographic Factors:  Divorced or widowed  Total Time spent with patient: 30 minutes    Psychiatric Specialty Exam: Physical Exam  ROS  Blood pressure 120/64, pulse 87, temperature 98.3 F (36.8 C), temperature source Oral, resp. rate 20, height 5' (1.524 m), weight 86.183 kg (190 lb), SpO2 99 %.Body mass index is 37.11 kg/(m^2).                                                       Have you used any form of tobacco in the last 30 days? (Cigarettes, Smokeless Tobacco, Cigars, and/or Pipes): No  Has this patient used any form of tobacco in the last 30 days? (Cigarettes, Smokeless Tobacco, Cigars, and/or Pipes) No  Mental Status Per Nursing Assessment::   On Admission:  NA  Current Mental Status by Physician: denies SI, HI or A/VH.  Mood is euthymic no evidence of mania or psychosis  Loss Factors: NA  Historical Factors: NA  Risk Reduction Factors:   Sense of responsibility to family, Living with another person, especially a relative and Positive social support  Continued Clinical Symptoms:  Previous Psychiatric Diagnoses and Treatments  Cognitive Features That Contribute To Risk:  None    Suicide Risk:  Minimal: No identifiable suicidal ideation.  Patients presenting with no risk factors but with morbid ruminations; may be classified as minimal risk based on the severity of the depressive symptoms  Principal Problem: Severe manic bipolar 1 disorder with psychotic behavior Discharge Diagnoses:  Patient Active Problem List   Diagnosis Date Noted  . Severe manic bipolar 1 disorder with psychotic behavior [F31.2] 10/20/2014  . Dyslipidemia [E78.5] 10/18/2014  . GERD (gastroesophageal reflux disease) [K21.9] 10/18/2014  . Hypothyroidism [E03.9] 10/18/2014  . Diabetes [E11.9] 10/17/2014  . Hypertension [I10] 10/17/2014  . Urinary incontinence [R32] 10/17/2014  . Suicidal ideation [R45.851]   .  Cholecystitis with cholelithiasis [K80.10] 06/02/2013    Follow-up Information    Call [facility].   Why:  Referral made, Please follow up with them to schedule appointment for Hospital Follow up, Outpatient Medication Management, Therapy   Contact information:   [address] [place] [address] Phone: [phone]      Follow up with [facility].   Why:  If you are unable to schedule with [facility] within the next month you may go to [facility] to bridge the time that you are waiting for an appointment for Hospital Follow up, Outpatient Medication Management, Therapy   Contact information:   [place] [id number] Phone: [phone]      Plan Of Care/Follow-up recommendations:  Other:  f/u with psychiatry  Is patient on multiple antipsychotic therapies at discharge:  No   Has Patient had three or more failed trials of antipsychotic monotherapy by history:  No  Recommended Plan for Multiple Antipsychotic Therapies: NA    Hildred Priest 10/25/2014, 7:26 AM

## 2014-10-25 NOTE — Progress Notes (Signed)
  [facility] Adult Case Management Discharge Plan :  Will you be returning to the same living situation after discharge:  No., home with sister in law At discharge, do you have transportation home?: Yes,    Do you have the ability to pay for your medications: Yes,     Release of information consent forms completed and in the chart;  Patient's signature needed at discharge.  Patient to Follow up at: Follow-up Information    Go to [facility].   Why:  Referral made, Please follow up with them to schedule appointment for Hospital Follow up, Outpatient Medication Management, Therapy,   Contact information:   [place] [address] Phone: [phone]      Follow up with [facility].   Why:  If you are unable to schedule with [facility] within the next month you may go to [facility] or [facility] to bridge the time that you are waiting for an appointment for Hospital Follow up, Outpatient Medication Management, Therapy   Contact information:   [facility] (Walk in [place], [place] 8am-3pm) [place] [id number] Phone: [phone]  [facility] (Walk in MON-FRI 9-4pm) [address] [place] [address] Phone: [phone]; [phone]      Patient denies SI/HI: Yes,       Safety Planning and Suicide Prevention discussed: Yes,     Have you used any form of tobacco in the last 30 days? (Cigarettes, Smokeless Tobacco, Cigars, and/or Pipes): No  Has patient been referred to the Quitline?: N/A patient is not a smoker    Erika Jacobs, Carloyn Jaeger, MSW, LCSW 10/25/2014, 3:59 PM

## 2014-10-25 NOTE — Progress Notes (Signed)
Patient discharged ambulatory to home, accompanied by friend. Denies SI or HI. Reviewed discharge instructions with patient, she verbalizes understanding. Patient received copy of discharge instructions, prescriptions, and all personal belongings.

## 2014-10-25 NOTE — Progress Notes (Signed)
D: Pt denies SI/HI/AVH. Pt is pleasant and cooperative. Pt stated "she feels better from taking her medication since she  Has been here" she appears less anxious and he is interacting with peers and staff appropriately.  A: Pt was offered support and encouragement. Pt was given scheduled medications. Pt was encouraged to attend groups. Q 15 minute checks were done for safety.  R:Pt attends groups and interacts well with peers and staff. Pt is taking medication. Pt has no complaints.Pt receptive to treatment and safety maintained on unit.

## 2014-10-25 NOTE — Tx Team (Signed)
Interdisciplinary Treatment Plan Update (Adult)  Date:  10/25/2014 Time Reviewed:  11:25 AM  Progress in Treatment: Attending groups: Yes. Participating in groups:  Yes. Taking medication as prescribed:  Yes. Tolerating medication:  Yes. Family/Significant othe contact made:  Yes, individual(s) contacted:   [name] Patient understands diagnosis:  Yes. Discussing patient identified problems/goals with staff:  Yes. Medical problems stabilized or resolved:  Yes. Denies suicidal/homicidal ideation: Yes. Issues/concerns per patient self-inventory:  Yes. Other:  New problem(s) identified: No, Describe:     Discharge Plan or Barriers: Pt plans to live with her sister in law at discharge.  She requested referral to [facility].  Will be seen at [facility] or [facility] to bridge time for appointment and continue medications.  Reason for Continuation of Hospitalization: Other; describe minimal sleep, adding sleep aid  Comments:Erika Jacobs is a 64 y.o. female who complains of stress and anxiety. She states that she came to the ER today because she was referred here by [facility]. Review of the records reveals that the patient was in the [facility] ER on June 25, and they plan to admit her for psychiatric stabilization with psychosis. However, the patient had a very good response to risperidoneand was discharged home with instructions to follow-up with  regional mental health clinic. She notes that she is had ongoing racing thoughts and paranoia as she usually does. She also had a brief time period of suicidal thoughts this morning without any plans.   She tells me that her mood is very depressed and sad. She thinks it's been that way for years. She can't specify how long it's been getting worse. She says she has terrible stress on her but when I asked what it was all she can tell me is that her children bother her. She is sleeping poorly at night. Not eating. Says  that she hears voices. She will not specify anything about what they actually say. All she says is "all kinds of things". She states that she is having suicidal ideation. She does not state any specific plan.   Estimated length of stay: 1 day  New goal(s):  Review of initial/current patient goals per problem list:   See Plan of Care  Attendees: Patient:  Erika Jacobs 7/6/201611:25 AM  Family:   7/6/201611:25 AM  Physician:  Merlyn Albert, MD 7/6/201611:25 AM  Nursing:    7/6/201611:25 AM  Case Manager:   7/6/201611:25 AM  Counselor:   7/6/201611:25 AM  Other:  Dossie Arbour, LCSW 7/6/201611:25 AM  Other:  Carmell Austria, [place] 7/6/201611:25 AM  Other:   7/6/201611:25 AM  Other:  7/6/201611:25 AM  Other:  7/6/201611:25 AM  Other:  7/6/201611:25 AM  Other:  7/6/201611:25 AM  Other:  7/6/201611:25 AM  Other:  7/6/201611:25 AM  Other:   7/6/201611:25 AM   Scribe for Treatment Team:   Dossie Arbour P,MSW, LCSW 10/25/2014, 11:25 AM

## 2014-10-25 NOTE — Discharge Summary (Signed)
Physician Discharge Summary Note  Patient:  Erika Jacobs is an 64 y.o., female MRN:  [id number] DOB:  1950/08/11 Patient phone:  [phone] (home)  Patient address:   [address] [place] [address],  Total Time spent with patient: 30 minutes  Date of Admission:  10/18/2014 Date of Discharge: 10/25/14  Reason for Admission:  mania  Principal Problem: Severe manic bipolar 1 disorder with psychotic behavior Discharge Diagnoses: Patient Active Problem List   Diagnosis Date Noted  . Severe manic bipolar 1 disorder with psychotic behavior [F31.2] 10/20/2014  . Dyslipidemia [E78.5] 10/18/2014  . GERD (gastroesophageal reflux disease) [K21.9] 10/18/2014  . Hypothyroidism [E03.9] 10/18/2014  . Diabetes [E11.9] 10/17/2014  . Hypertension [I10] 10/17/2014  . Urinary incontinence [R32] 10/17/2014  . Suicidal ideation [R45.851]   . Cholecystitis with cholelithiasis [K80.10] 06/02/2013    Musculoskeletal: Strength & Muscle Tone: within normal limits Gait & Station: normal Patient leans: N/A  Psychiatric Specialty Exam: Physical Exam  Review of Systems  Constitutional: Negative.   HENT: Negative.   Eyes: Negative.   Respiratory: Negative.   Cardiovascular: Negative.   Gastrointestinal: Negative.   Genitourinary: Negative.   Musculoskeletal: Negative.   Skin: Negative.   Neurological: Negative.   Endo/Heme/Allergies: Negative.   Psychiatric/Behavioral: Negative.     Blood pressure 120/64, pulse 87, temperature 98.3 F (36.8 C), temperature source Oral, resp. rate 20, height 5' (1.524 m), weight 86.183 kg (190 lb), SpO2 99 %.Body mass index is 37.11 kg/(m^2).  General Appearance: Well Groomed  Engineer, water::  Good  Speech:  Normal Rate  Volume:  Normal  Mood:  Euthymic  Affect:  Congruent  Thought Process:  Linear and Logical  Orientation:  Full (Time, Place, and Person)  Thought Content:  Hallucinations: None  Suicidal Thoughts:  No  Homicidal Thoughts:  No  Memory:   Immediate;   Good Recent;   Good Remote;   Good  Judgement:  Good  Insight:  Good  Psychomotor Activity:  Normal  Concentration:  Good  Recall:  NA  Fund of Knowledge:Good  Language: Good  Akathisia:  No  Handed:    AIMS (if indicated):     Assets:  Communication Skills Desire for Improvement Financial Resources/Insurance Housing Social Support  ADL's:  Intact  Cognition: WNL  Sleep:  Number of Hours: 6.75   Have you used any form of tobacco in the last 30 days? (Cigarettes, Smokeless Tobacco, Cigars, and/or Pipes): No  Has this patient used any form of tobacco in the last 30 days? (Cigarettes, Smokeless Tobacco, Cigars, and/or Pipes) N/A  History of Present Illness: BRENNLEY Jacobs is a 64 y.o. female who complains of stress and anxiety. She states that she came to the ER today because she was referred here by [facility]. Review of the records reveals that the patient was in the [facility] ER on June 25, and they plan to admit her for psychiatric stabilization with psychosis. However, the patient had a very good response to risperidoneand was discharged home with instructions to follow-up with [facility]. She notes that she is had ongoing racing thoughts and paranoia as she usually does. She also had a brief time period of suicidal thoughts this morning without any plans.   She tells me that her mood is very depressed and sad. She thinks it's been that way for years. She can't specify how long it's been getting worse. She says she has terrible stress on her but when I asked what  it was all she can tell me is that her children bother her. She is sleeping poorly at night. Not eating. Says that she hears voices. She will not specify anything about what they actually say. All she says is "all kinds of things". She states that she is having suicidal ideation. She does not state any specific plan.   Patient during the interview reported that she felt the  physicians in the emergency department were mocking her and were performing an [place] dance. She is stated that she became suicidal because nobody was helping her in the emergency department and she thinks they were doing that on purpose to push her to her limits.   Patient disliked the Risperdal as she reports she started having nightmares with this medication and she does not want to continue with. She thinks that she's been giving medications that are causing severe side effects such as kidney damage when there is really no evidence of such things.   Substance abuse history: Denies any history of alcohol abuse or drug abuse. None documented.  Current medication: Psychiatrically she is prescribed Risperdal M tabs a half milligram at night and Effexor extended release 75 mg a day.   HPI Elements: Quality: Depression suicidal thoughts and hallucinations. Severity: Severe and potentially life threatening. Timing: Evidently have been going on for years but worse just in the last few weeks. Duration: Chronic but worsening recently and up and down from day to day. Context: Unknown what exactly might be causing this worsening.  Past psychiatric history: No known history of past psychiatric hospitalization. She had been treated with Effexor by a primary care doctor. Denies any history of suicide attempts. These psychotic symptoms appear to be a new complaint. Patient however states that her symptoms of been present for years.  Past Medical History: Diabetes, high blood pressure, elevated cholesterol, history of urinary incontinence, history of gallstones Past Medical History  Diagnosis Date  . Diabetes mellitus without complication   . Panic attack   . Hypertension   . Hypercholesteremia   . GERD (gastroesophageal reflux disease)   . Hypothyroid   . Overactive bladder   . Arthritis   . Anemia   . Shortness of breath     Past Surgical History   Procedure Laterality Date  . Tubal ligation    . Eye surgery Left   . Colonoscopy with esophagogastroduodenoscopy (egd) N/A 05/20/2013    Procedure: COLONOSCOPY WITH ESOPHAGOGASTRODUODENOSCOPY (EGD); Surgeon: Rogene Houston, MD; Location: [facility]; Service: Endoscopy; Laterality: N/A; 730  . Cholecystectomy N/A 06/02/2013    Procedure: LAPAROSCOPIC CHOLECYSTECTOMY; Surgeon: Scherry Ran, MD; Location: [facility] ORS; Service: General; Laterality: N/A;   Family History: No family history on file. per son there is extensive family history of mental illness.  Social History: Patient tells me that she lives by herself. Son says she lives alone in [place]. Patient was incarcerated in [place] with some charges related to gun trafficking.  History  Alcohol Use No    History  Drug Use No    History   Social History  . Marital Status: Divorced    Spouse Name: N/A  . Number of Children: N/A  . Years of Education: N/A   Social History Main Topics  . Smoking status: Former Smoker -- 2.00 packs/day for 30 years    Types: Cigarettes    Quit date: 05/20/2002  . Smokeless tobacco: Not on file  . Alcohol Use: No  . Drug Use: No  . Sexual Activity:  Yes    Birth Control/ Protection: Post-menopausal         Hospital Course:   64 year old Caucasian female with history of depression. Patient presents to the hospital with a possibly new onset paranoia and suicidal ideation. The patient has been in 3 different hospitals over the last couple of weeks, [facility] and 2 days ago [facility]. Per family pt has always been paranoid but lately has become much worse and patient is not able to function. Patient has made clear persecutory statements to the family much more intense and severe than in the past. Family worries about the amount of medication patient is taking. They also  wonder about the possibility of dementia.  Mania/psychosis: Continue Abilify 15 mg by mouth daily at bedtime. Effexor has been decreased to 75 mg by mouth daily  Insomnia: Will change the Ambien to daily at bedtime.  HTN: continue norvas 5 mg po q day.   Dyslipidemia: continue pravachol 40 mg po q day  DM: continue metformin 500 mg po bid. HbA1c >10 today. Nurse Diabetes specilist recommended lantus 9 mg qhs. CBG tid and supplemental insulin tid.  Vit B12 def: Levels of B12 were within the normal limits. Continue b12 1000 mcg /day.   Vit D def: continue vit D 2000 U q day  Anemia: continue Fe So4 150 mg po q day  Urinary incontinence: continue ditropan 5 mg po qhs  GERD: continue PPI q day  Back pain: PT seein the pt: "Pt will be seen for one more visit to review simple stretches which pt wrote for herself as instructed by PT. Will check on her to see that she is doing well and will not need further instruction as her initial instruction was repetitive. Will likely be ready to DC "  Constipation: Colace and Miralax.   Stroke prevention. ASA 81 mg.  Collateral info: contacted pt's family [phone] (son) and [phone] Brother.   Labs: Lipid panel was wnl. Ammonia was 15. TSH was wnl. Vit B12 was wnl. Pending HIV, RPR.  Imaging: Brain MRI completed on 6/30---neg  On the day of discharge patient was much improved. Her mood was euthymic after reactive and bright. The patient was no longer showing signs of hypomania or mania. She was not suspicious or paranoid. Patient was calm pleasant and cooperative. Patient agreeable letting her family administered medications for her. She also was advised about not driving for at least 2 weeks patient agrees.  Family was contacted yesterday in the plan of care was discussed with them and they agree.  There were no issues about the patient's safety at the time of the discharge. This hospitalization was uneventful at no time the patient  required seclusion, restraints or forced medications.   Discharge Vitals:   Blood pressure 120/64, pulse 87, temperature 98.3 F (36.8 C), temperature source Oral, resp. rate 20, height 5' (1.524 m), weight 86.183 kg (190 lb), SpO2 99 %. Body mass index is 37.11 kg/(m^2).   Lab Results:   Results for AIZLYNN, DIGILIO (MRN [id number]) as of 10/25/2014 07:30  Ref. Range 10/13/2014 20:03 10/17/2014 09:25 10/18/2014 18:31  Sodium Latest Ref Range: 135-145 mmol/L 140 138   Potassium Latest Ref Range: 3.5-5.1 mmol/L 4.5 4.3   Chloride Latest Ref Range: 101-111 mmol/L 106 106   CO2 Latest Ref Range: 22-32 mmol/L 22 23   BUN Latest Ref Range: 6-20 mg/dL 29 (H) 47 (H)   Creatinine Latest Ref Range: 0.44-1.00 mg/dL  1.21 (H) 1.49 (H)   Calcium Latest Ref Range: 8.9-10.3 mg/dL 9.6 9.8   EGFR (Non-African Amer.) Latest Ref Range: >60 mL/min 47 (L) 37 (L)   EGFR (African American) Latest Ref Range: >60 mL/min 55 (L) 43 (L)   Glucose Latest Ref Range: 65-99 mg/dL 159 (H) 198 (H)   Anion gap Latest Ref Range: 5-_0 Alkaline Phosphatase Latest Ref Range: 38-126 U/L 62 67   Albumin Latest Ref Range: 3.5-5.0 g/dL 4.3 4.2   AST Latest Ref Range: 15-41 U/L 28 37   ALT Latest Ref Range: 14-54 U/L 33 31   Total Protein Latest Ref Range: 6.5-8.1 g/dL 8.3 (H) 7.8   Ammonia Latest Ref Range: 9-35 umol/L   15  Total Bilirubin Latest Ref Range: 0.3-1.2 mg/dL 0.6 0.3   Cholesterol Latest Ref Range: 0-200 mg/dL   116  Triglycerides Latest Ref Range: <150 mg/dL   67  HDL Cholesterol Latest Ref Range: >40 mg/dL   63  LDL (calc) Latest Ref Range: 0-99 mg/dL   40  VLDL Latest Ref Range: 0-40 mg/dL   13  Total CHOL/HDL Ratio Latest Units: RATIO   1.8  Vitamin B-12 Latest Ref Range: 180-914 pg/mL   741  WBC Latest Ref Range: 3.6-11.0 K/uL 7.6 7.5   RBC Latest Ref Range: 3.80-5.20 MIL/uL 4.80 4.43   Hemoglobin Latest Ref Range: 12.0-16.0 g/dL 12.2 11.4 (L)   HCT Latest Ref Range: 35.0-47.0 % 39.3 35.9   MCV  Latest Ref Range: 80.0-100.0 fL 81.9 81.0   MCH Latest Ref Range: 26.0-34.0 pg 25.4 (L) 25.7 (L)   MCHC Latest Ref Range: 32.0-36.0 g/dL 31.0 31.7 (L)   RDW Latest Ref Range: 11.5-14.5 % 15.7 (H) 16.1 (H)   Platelets Latest Ref Range: 150-440 K/uL 242 198   Acetaminophen (Tylenol), S Latest Ref Range: 10-30 ug/mL <10 (L)    Hemoglobin A1C Latest Ref Range: 4.0-6.0 %   10.9 (H)  TSH Latest Ref Range: 0.350-4.500 uIU/mL   1.486   Results for TURQUOISE, ESCH (MRN [id number]) as of 10/25/2014 07:30  Ref. Range 05/20/2013 08:25 10/24/2014 13:04  RPR Latest Ref Range: Non Reactive   Non Reactive  H Pylori IgG Latest Units: ISR 0.61   HIV Screen 4th Generation wRfx Latest Ref Range: Non Reactive   Non Reactive   Results for AOKI, WEDEMEYER (MRN [id number]) as of 10/25/2014 07:30  Ref. Range 10/17/2014 09:25  Amphetamines, Ur Screen Latest Ref Range: NONE DETECTED  NONE DETECTED  Barbiturates, Ur Screen Latest Ref Range: NONE DETECTED  NONE DETECTED  Benzodiazepine, Ur Scrn Latest Ref Range: NONE DETECTED  POSITIVE (A)  Cocaine Metabolite,Ur [place] Latest Ref Range: NONE DETECTED  NONE DETECTED  Methadone Scn, Ur Latest Ref Range: NONE DETECTED  NONE DETECTED  MDMA (Ecstasy)Ur Screen Latest Ref Range: NONE DETECTED  NONE DETECTED  Cannabinoid 50 Ng, Ur [place] Latest Ref Range: NONE DETECTED  NONE DETECTED  Opiate, Ur Screen Latest Ref Range: NONE DETECTED  NONE DETECTED  Phencyclidine (PCP) Ur S Latest Ref Range: NONE DETECTED  NONE DETECTED  Tricyclic, Ur Screen Latest Ref Range: NONE DETECTED  NONE DETECTED   EXAM: MRI HEAD WITHOUT AND WITH CONTRAST  TECHNIQUE: Multiplanar, multiecho pulse sequences of the brain and surrounding structures were obtained without and with intravenous contrast.  CONTRAST: 11m MULTIHANCE GADOBENATE DIMEGLUMINE 529 MG/ML IV SOLN  COMPARISON: CT head without contrast 04/29/2014.  FINDINGS: No acute infarct, hemorrhage, or mass lesion is present.  Minimal periventricular  T2 changes are within normal limits for age. Insert pass ventricles insert pass fluid  Flow is present in the major intracranial arteries. The left lens extraction is noted. The globes and orbits are otherwise intact. A polyp or mucous retention cyst is noted anteriorly in the left maxillary sinus. The remaining paranasal sinuses and the mastoid air cells are clear.  Postcontrast images demonstrate no pathologic enhancement.  Skullbase is within normal limits. Midline structures are unremarkable.  IMPRESSION: Negative MRI of the brain for age.  Physical Findings: AIMS: Facial and Oral Movements Muscles of Facial Expression: None, normal Lips and Perioral Area: None, normal Jaw: None, normal Tongue: None, normal,Extremity Movements Upper (arms, wrists, hands, fingers): None, normal Lower (legs, knees, ankles, toes): None, normal, Trunk Movements Neck, shoulders, hips: None, normal, Overall Severity Severity of abnormal movements (highest score from questions above): None, normal Incapacitation due to abnormal movements: None, normal Patient's awareness of abnormal movements (rate only patient's report): No Awareness, Dental Status Current problems with teeth and/or dentures?: Yes Does patient usually wear dentures?: Yes (at home)     Discharge destination:  Home  Is patient on multiple antipsychotic therapies at discharge:  No   Has Patient had three or more failed trials of antipsychotic monotherapy by history:  No    Recommended Plan for Multiple Antipsychotic Therapies: NA  Discharge Instructions    Diet Carb Modified    Complete by:  As directed             Medication List    STOP taking these medications        DSS 100 MG Caps     naproxen 375 MG tablet  Commonly known as:  NAPROSYN     risperiDONE 0.5 MG disintegrating tablet  Commonly known as:  RISPERDAL M-TABS     vitamin B-12 1000 MCG tablet  Commonly known as:   CYANOCOBALAMIN     vitamin C 1000 MG tablet      TAKE these medications      Indication   amLODipine 5 MG tablet  Commonly known as:  NORVASC  Take 1 tablet (5 mg total) by mouth daily.  Notes to Patient:  Blood pressure      ARIPiprazole 15 MG tablet  Commonly known as:  ABILIFY  Take 1 tablet (15 mg total) by mouth at bedtime.  Notes to Patient:  bipolar   Indication:  Manic Phase of Manic-Depression     insulin glargine 100 UNIT/ML injection  Commonly known as:  LANTUS  Inject 0.09 mLs (9 Units total) into the skin at bedtime.  Notes to Patient:  diabetes      iron polysaccharides 150 MG capsule  Commonly known as:  NIFEREX  Take 150 mg by mouth daily.  Notes to Patient:  Low iron      metFORMIN 500 MG tablet  Commonly known as:  GLUCOPHAGE  Take 500 mg by mouth 2 (two) times daily with a meal.  Notes to Patient:  diabetes      omeprazole 20 MG capsule  Commonly known as:  PRILOSEC  Take 20 mg by mouth daily.  Notes to Patient:  GERD      oxybutynin 5 MG tablet  Commonly known as:  DITROPAN  Take 5 mg by mouth daily.  Notes to Patient:  incontinence      pravastatin 40 MG tablet  Commonly known as:  PRAVACHOL  Take 40 mg by mouth daily.  Notes to Patient:  cholesterol  venlafaxine XR 75 MG 24 hr capsule  Commonly known as:  EFFEXOR-XR  Take 1 capsule (75 mg total) by mouth daily with breakfast.  Notes to Patient:  Anxiety and depression      Vitamin D 2000 UNITS Caps  Take 2,000 Units by mouth daily.  Notes to Patient:  Low vitamin D      zolpidem 5 MG tablet  Commonly known as:  AMBIEN  Take 1 tablet (5 mg total) by mouth at bedtime as needed for sleep.  Notes to Patient:  insomnia            Follow-up Information    Call [facility].   Why:  Referral made, Please follow up with them to schedule appointment for Hospital Follow up, Outpatient Medication Management, Therapy   Contact information:   [place].  [place] [address] Phone: [phone]      Follow up with [facility].   Why:  If you are unable to schedule with [facility] within the next month you may go to [facility] to bridge the time that you are waiting for an appointment for Hospital Follow up, Outpatient Medication Management, Therapy   Contact information:   [place] [address] Phone: [phone]       Total Discharge Time: >30 minutes as family was contacted to coordinate care  Signed: Hildred Priest 10/25/2014, 7:29 AM

## 2014-10-25 NOTE — [facility] Group Notes (Signed)
[facility] Group Notes:  (Nursing/MHT/Case Management/Adjunct)  Date:  10/25/2014  Time:  12:33 PM  Type of Therapy:  Group Therapy  Participation Level:  Active  Participation Quality:  Appropriate  Affect:  Appropriate  Cognitive:  Appropriate  Insight:  Good  Engagement in Group:  Engaged  Modes of Intervention:  Activity  Summary of Progress/Problems:  Erika Jacobs 10/25/2014, 12:33 PM

## 2014-10-25 NOTE — [facility] Group Notes (Signed)
[facility] LCSW Aftercare Discharge Planning Group Note  10/25/2014 10:29 AM  Participation Quality:  Appropriate and Attentive  Affect:  Appropriate  Cognitive:  Alert, Appropriate and Oriented  Insight:  Engaged  Engagement in Group:  Engaged  Modes of Intervention:  Education, Socialization and Support  Summary of Progress/Problems: Patient attended and participated in group discussion appropriately. Patient shared that her SMART goal is to "go home today, take my medications".   Keene Breath, MSW, LCSWA 10/25/2014, 10:29 AM

## 2014-10-26 LAB — GLUCOSE, CAPILLARY: Glucose-Capillary: 93 mg/dL (ref 65–99)

## 2014-11-21 ENCOUNTER — Ambulatory Visit: Payer: Self-pay | Admitting: Unknown Physician Specialty

## 2014-12-04 ENCOUNTER — Ambulatory Visit: Payer: Medicare Other | Admitting: Psychiatry

## 2015-05-13 DIAGNOSIS — Z7982 Long term (current) use of aspirin: Secondary | ICD-10-CM | POA: Diagnosis not present

## 2015-05-13 DIAGNOSIS — Z79899 Other long term (current) drug therapy: Secondary | ICD-10-CM | POA: Diagnosis not present

## 2015-05-13 DIAGNOSIS — E7800 Pure hypercholesterolemia, unspecified: Secondary | ICD-10-CM | POA: Diagnosis not present

## 2015-05-13 DIAGNOSIS — K219 Gastro-esophageal reflux disease without esophagitis: Secondary | ICD-10-CM | POA: Diagnosis not present

## 2015-05-13 DIAGNOSIS — F419 Anxiety disorder, unspecified: Secondary | ICD-10-CM | POA: Diagnosis not present

## 2015-05-13 DIAGNOSIS — E119 Type 2 diabetes mellitus without complications: Secondary | ICD-10-CM | POA: Diagnosis not present

## 2015-05-13 DIAGNOSIS — I10 Essential (primary) hypertension: Secondary | ICD-10-CM | POA: Diagnosis not present

## 2015-05-13 DIAGNOSIS — Z794 Long term (current) use of insulin: Secondary | ICD-10-CM | POA: Diagnosis not present

## 2015-05-14 DIAGNOSIS — Z789 Other specified health status: Secondary | ICD-10-CM | POA: Diagnosis not present

## 2015-05-14 DIAGNOSIS — Z794 Long term (current) use of insulin: Secondary | ICD-10-CM | POA: Diagnosis not present

## 2015-05-14 DIAGNOSIS — H538 Other visual disturbances: Secondary | ICD-10-CM | POA: Diagnosis not present

## 2015-05-14 DIAGNOSIS — F329 Major depressive disorder, single episode, unspecified: Secondary | ICD-10-CM | POA: Diagnosis not present

## 2015-05-14 DIAGNOSIS — F419 Anxiety disorder, unspecified: Secondary | ICD-10-CM | POA: Diagnosis not present

## 2015-05-14 DIAGNOSIS — Z79899 Other long term (current) drug therapy: Secondary | ICD-10-CM | POA: Diagnosis not present

## 2015-05-14 DIAGNOSIS — E1142 Type 2 diabetes mellitus with diabetic polyneuropathy: Secondary | ICD-10-CM | POA: Diagnosis not present

## 2015-05-14 DIAGNOSIS — Z7984 Long term (current) use of oral hypoglycemic drugs: Secondary | ICD-10-CM | POA: Diagnosis not present

## 2015-05-14 DIAGNOSIS — Z87891 Personal history of nicotine dependence: Secondary | ICD-10-CM | POA: Diagnosis not present

## 2015-05-14 DIAGNOSIS — Z7982 Long term (current) use of aspirin: Secondary | ICD-10-CM | POA: Diagnosis not present

## 2015-05-14 DIAGNOSIS — N390 Urinary tract infection, site not specified: Secondary | ICD-10-CM | POA: Diagnosis not present

## 2015-05-14 DIAGNOSIS — K219 Gastro-esophageal reflux disease without esophagitis: Secondary | ICD-10-CM | POA: Diagnosis not present

## 2015-05-14 DIAGNOSIS — E119 Type 2 diabetes mellitus without complications: Secondary | ICD-10-CM | POA: Diagnosis not present

## 2015-05-14 DIAGNOSIS — Z6839 Body mass index (BMI) 39.0-39.9, adult: Secondary | ICD-10-CM | POA: Diagnosis not present

## 2015-05-14 DIAGNOSIS — I10 Essential (primary) hypertension: Secondary | ICD-10-CM | POA: Diagnosis not present

## 2015-05-14 DIAGNOSIS — Z418 Encounter for other procedures for purposes other than remedying health state: Secondary | ICD-10-CM | POA: Diagnosis not present

## 2015-05-14 DIAGNOSIS — R51 Headache: Secondary | ICD-10-CM | POA: Diagnosis not present

## 2015-05-24 DIAGNOSIS — N183 Chronic kidney disease, stage 3 (moderate): Secondary | ICD-10-CM | POA: Diagnosis not present

## 2015-05-24 DIAGNOSIS — F419 Anxiety disorder, unspecified: Secondary | ICD-10-CM | POA: Diagnosis not present

## 2015-05-24 DIAGNOSIS — E1142 Type 2 diabetes mellitus with diabetic polyneuropathy: Secondary | ICD-10-CM | POA: Diagnosis not present

## 2015-05-24 DIAGNOSIS — M79603 Pain in arm, unspecified: Secondary | ICD-10-CM | POA: Diagnosis not present

## 2015-05-25 DIAGNOSIS — M25562 Pain in left knee: Secondary | ICD-10-CM | POA: Diagnosis not present

## 2015-05-25 DIAGNOSIS — I129 Hypertensive chronic kidney disease with stage 1 through stage 4 chronic kidney disease, or unspecified chronic kidney disease: Secondary | ICD-10-CM | POA: Diagnosis not present

## 2015-05-25 DIAGNOSIS — R079 Chest pain, unspecified: Secondary | ICD-10-CM | POA: Diagnosis not present

## 2015-05-25 DIAGNOSIS — Z79899 Other long term (current) drug therapy: Secondary | ICD-10-CM | POA: Diagnosis not present

## 2015-05-25 DIAGNOSIS — Z23 Encounter for immunization: Secondary | ICD-10-CM | POA: Diagnosis not present

## 2015-05-25 DIAGNOSIS — N189 Chronic kidney disease, unspecified: Secondary | ICD-10-CM | POA: Diagnosis not present

## 2015-05-25 DIAGNOSIS — E1122 Type 2 diabetes mellitus with diabetic chronic kidney disease: Secondary | ICD-10-CM | POA: Diagnosis not present

## 2015-05-25 DIAGNOSIS — Z794 Long term (current) use of insulin: Secondary | ICD-10-CM | POA: Diagnosis not present

## 2015-05-25 DIAGNOSIS — Z7982 Long term (current) use of aspirin: Secondary | ICD-10-CM | POA: Diagnosis not present

## 2015-05-25 DIAGNOSIS — F419 Anxiety disorder, unspecified: Secondary | ICD-10-CM | POA: Diagnosis not present

## 2015-05-26 DIAGNOSIS — I129 Hypertensive chronic kidney disease with stage 1 through stage 4 chronic kidney disease, or unspecified chronic kidney disease: Secondary | ICD-10-CM | POA: Diagnosis not present

## 2015-05-26 DIAGNOSIS — M25562 Pain in left knee: Secondary | ICD-10-CM | POA: Diagnosis not present

## 2015-05-26 DIAGNOSIS — R079 Chest pain, unspecified: Secondary | ICD-10-CM | POA: Diagnosis not present

## 2015-05-26 DIAGNOSIS — E1122 Type 2 diabetes mellitus with diabetic chronic kidney disease: Secondary | ICD-10-CM | POA: Diagnosis not present

## 2015-05-27 DIAGNOSIS — E1122 Type 2 diabetes mellitus with diabetic chronic kidney disease: Secondary | ICD-10-CM | POA: Diagnosis not present

## 2015-05-27 DIAGNOSIS — R079 Chest pain, unspecified: Secondary | ICD-10-CM | POA: Diagnosis not present

## 2015-05-28 DIAGNOSIS — I10 Essential (primary) hypertension: Secondary | ICD-10-CM | POA: Diagnosis not present

## 2015-05-28 DIAGNOSIS — R0789 Other chest pain: Secondary | ICD-10-CM | POA: Diagnosis not present

## 2015-05-28 DIAGNOSIS — F419 Anxiety disorder, unspecified: Secondary | ICD-10-CM | POA: Diagnosis not present

## 2015-05-28 DIAGNOSIS — E1122 Type 2 diabetes mellitus with diabetic chronic kidney disease: Secondary | ICD-10-CM | POA: Diagnosis not present

## 2015-05-28 DIAGNOSIS — R079 Chest pain, unspecified: Secondary | ICD-10-CM | POA: Diagnosis not present

## 2015-05-28 DIAGNOSIS — E785 Hyperlipidemia, unspecified: Secondary | ICD-10-CM | POA: Diagnosis not present

## 2015-05-28 DIAGNOSIS — Z720 Tobacco use: Secondary | ICD-10-CM | POA: Diagnosis not present

## 2015-05-29 ENCOUNTER — Emergency Department
Admission: EM | Admit: 2015-05-29 | Discharge: 2015-05-30 | Disposition: A | Discharge status: Other Acute Inpatient Hospital | Payer: Medicare Other | Attending: Emergency Medicine | Admitting: Emergency Medicine

## 2015-05-29 ENCOUNTER — Encounter: Payer: Self-pay | Admitting: Emergency Medicine

## 2015-05-29 DIAGNOSIS — E039 Hypothyroidism, unspecified: Secondary | ICD-10-CM | POA: Diagnosis present

## 2015-05-29 DIAGNOSIS — Z87891 Personal history of nicotine dependence: Secondary | ICD-10-CM | POA: Insufficient documentation

## 2015-05-29 DIAGNOSIS — F1310 Sedative, hypnotic or anxiolytic abuse, uncomplicated: Secondary | ICD-10-CM | POA: Diagnosis not present

## 2015-05-29 DIAGNOSIS — Z794 Long term (current) use of insulin: Secondary | ICD-10-CM | POA: Diagnosis not present

## 2015-05-29 DIAGNOSIS — I10 Essential (primary) hypertension: Secondary | ICD-10-CM | POA: Insufficient documentation

## 2015-05-29 DIAGNOSIS — F22 Delusional disorders: Secondary | ICD-10-CM | POA: Insufficient documentation

## 2015-05-29 DIAGNOSIS — E119 Type 2 diabetes mellitus without complications: Secondary | ICD-10-CM | POA: Insufficient documentation

## 2015-05-29 DIAGNOSIS — Z7984 Long term (current) use of oral hypoglycemic drugs: Secondary | ICD-10-CM | POA: Insufficient documentation

## 2015-05-29 DIAGNOSIS — Z008 Encounter for other general examination: Secondary | ICD-10-CM | POA: Diagnosis present

## 2015-05-29 DIAGNOSIS — E785 Hyperlipidemia, unspecified: Secondary | ICD-10-CM | POA: Diagnosis present

## 2015-05-29 DIAGNOSIS — R0789 Other chest pain: Secondary | ICD-10-CM | POA: Diagnosis not present

## 2015-05-29 DIAGNOSIS — Z79899 Other long term (current) drug therapy: Secondary | ICD-10-CM | POA: Diagnosis not present

## 2015-05-29 DIAGNOSIS — R079 Chest pain, unspecified: Secondary | ICD-10-CM | POA: Diagnosis not present

## 2015-05-29 DIAGNOSIS — F333 Major depressive disorder, recurrent, severe with psychotic symptoms: Secondary | ICD-10-CM | POA: Diagnosis not present

## 2015-05-29 DIAGNOSIS — E1122 Type 2 diabetes mellitus with diabetic chronic kidney disease: Secondary | ICD-10-CM | POA: Diagnosis not present

## 2015-05-29 HISTORY — DX: Bipolar disorder, unspecified: F31.9

## 2015-05-29 LAB — CBC WITH DIFFERENTIAL/PLATELET
Basophils Absolute: 0.1 10*3/uL (ref 0–0.1)
Basophils Relative: 1 %
Eosinophils Absolute: 0.2 10*3/uL (ref 0–0.7)
Eosinophils Relative: 2 %
HEMATOCRIT: 37 % (ref 35.0–47.0)
HEMOGLOBIN: 11.8 g/dL — AB (ref 12.0–16.0)
Lymphocytes Relative: 25 %
Lymphs Abs: 2 10*3/uL (ref 1.0–3.6)
MCH: 25 pg — AB (ref 26.0–34.0)
MCHC: 31.8 g/dL — AB (ref 32.0–36.0)
MCV: 78.5 fL — ABNORMAL LOW (ref 80.0–100.0)
Monocytes Absolute: 0.6 10*3/uL (ref 0.2–0.9)
Monocytes Relative: 7 %
NEUTROS ABS: 5.2 10*3/uL (ref 1.4–6.5)
Neutrophils Relative %: 65 %
Platelets: 228 10*3/uL (ref 150–440)
RBC: 4.71 MIL/uL (ref 3.80–5.20)
RDW: 15.9 % — ABNORMAL HIGH (ref 11.5–14.5)
WBC: 8 10*3/uL (ref 3.6–11.0)

## 2015-05-29 LAB — COMPREHENSIVE METABOLIC PANEL
ALK PHOS: 65 U/L (ref 38–126)
ALT: 63 U/L — AB (ref 14–54)
AST: 45 U/L — ABNORMAL HIGH (ref 15–41)
Albumin: 4 g/dL (ref 3.5–5.0)
Anion gap: 10 (ref 5–15)
BUN: 20 mg/dL (ref 6–20)
CALCIUM: 9.6 mg/dL (ref 8.9–10.3)
CO2: 24 mmol/L (ref 22–32)
CREATININE: 1.18 mg/dL — AB (ref 0.44–1.00)
Chloride: 107 mmol/L (ref 101–111)
GFR, EST AFRICAN AMERICAN: 56 mL/min — AB (ref >60)
GFR, EST NON AFRICAN AMERICAN: 48 mL/min — AB (ref >60)
Glucose, Bld: 247 mg/dL — ABNORMAL HIGH (ref 65–99)
Potassium: 4.3 mmol/L (ref 3.5–5.1)
Sodium: 141 mmol/L (ref 135–145)
Total Bilirubin: 0.4 mg/dL (ref 0.3–1.2)
Total Protein: 8.1 g/dL (ref 6.5–8.1)

## 2015-05-29 LAB — ETHANOL

## 2015-05-29 NOTE — ED Notes (Signed)
Pt's daughter: Margaretmary Eddy [phone].

## 2015-05-29 NOTE — ED Notes (Signed)
Pt arrived to the ED accompanied by her daughter for a psychiatric evaluation. Per Pt's daughter, the Pt has been under a lot of stress and that has been making the Pt very paranoid thinking that everyone wants to hurt her. Pt is AOx4 with some notable paranoia, reports visual and auditory hallucinations, denies SI and HI at this time.

## 2015-05-30 DIAGNOSIS — I10 Essential (primary) hypertension: Secondary | ICD-10-CM | POA: Diagnosis not present

## 2015-05-30 DIAGNOSIS — E119 Type 2 diabetes mellitus without complications: Secondary | ICD-10-CM | POA: Diagnosis not present

## 2015-05-30 DIAGNOSIS — F333 Major depressive disorder, recurrent, severe with psychotic symptoms: Secondary | ICD-10-CM

## 2015-05-30 DIAGNOSIS — Z9119 Patient's noncompliance with other medical treatment and regimen: Secondary | ICD-10-CM | POA: Diagnosis not present

## 2015-05-30 DIAGNOSIS — F250 Schizoaffective disorder, bipolar type: Secondary | ICD-10-CM | POA: Diagnosis not present

## 2015-05-30 DIAGNOSIS — F22 Delusional disorders: Secondary | ICD-10-CM | POA: Diagnosis not present

## 2015-05-30 DIAGNOSIS — K219 Gastro-esophageal reflux disease without esophagitis: Secondary | ICD-10-CM | POA: Diagnosis not present

## 2015-05-30 DIAGNOSIS — M1990 Unspecified osteoarthritis, unspecified site: Secondary | ICD-10-CM | POA: Diagnosis present

## 2015-05-30 DIAGNOSIS — Z794 Long term (current) use of insulin: Secondary | ICD-10-CM | POA: Diagnosis not present

## 2015-05-30 LAB — URINE DRUG SCREEN, QUALITATIVE (ARMC ONLY)
Amphetamines, Ur Screen: NOT DETECTED
BARBITURATES, UR SCREEN: NOT DETECTED
Benzodiazepine, Ur Scrn: POSITIVE — AB
COCAINE METABOLITE, UR ~~LOC~~: NOT DETECTED
Cannabinoid 50 Ng, Ur ~~LOC~~: NOT DETECTED
MDMA (ECSTASY) UR SCREEN: NOT DETECTED
Methadone Scn, Ur: NOT DETECTED
OPIATE, UR SCREEN: NOT DETECTED
Phencyclidine (PCP) Ur S: NOT DETECTED
TRICYCLIC, UR SCREEN: NOT DETECTED

## 2015-05-30 LAB — URINALYSIS COMPLETE WITH MICROSCOPIC (ARMC ONLY)
Bacteria, UA: NONE SEEN
Bilirubin Urine: NEGATIVE
Glucose, UA: 50 mg/dL — AB
HGB URINE DIPSTICK: NEGATIVE
NITRITE: NEGATIVE
Protein, ur: NEGATIVE mg/dL
Specific Gravity, Urine: 1.023 (ref 1.005–1.030)
pH: 5 (ref 5.0–8.0)

## 2015-05-30 LAB — GLUCOSE, CAPILLARY: GLUCOSE-CAPILLARY: 180 mg/dL — AB (ref 65–99)

## 2015-05-30 MED ORDER — ARIPIPRAZOLE 15 MG PO TABS
15.0000 mg | ORAL_TABLET | Freq: Every day | ORAL | Status: DC
  Filled 2015-05-30: qty 1

## 2015-05-30 MED ORDER — AMLODIPINE BESYLATE 5 MG PO TABS
5.0000 mg | ORAL_TABLET | Freq: Every day | ORAL | Status: DC
  Administered 2015-05-30: 5 mg via ORAL
  Filled 2015-05-30: qty 1

## 2015-05-30 MED ORDER — PANTOPRAZOLE SODIUM 40 MG PO TBEC
40.0000 mg | DELAYED_RELEASE_TABLET | Freq: Every day | ORAL | Status: DC
  Administered 2015-05-30: 40 mg via ORAL
  Filled 2015-05-30: qty 1

## 2015-05-30 MED ORDER — METFORMIN HCL 500 MG PO TABS: 1000.0000 mg | ORAL_TABLET | Freq: Two times a day (BID) | ORAL | Status: DC

## 2015-05-30 MED ORDER — OXYBUTYNIN CHLORIDE 5 MG PO TABS
5.0000 mg | ORAL_TABLET | Freq: Every morning | ORAL | Status: DC
  Administered 2015-05-30: 5 mg via ORAL
  Filled 2015-05-30: qty 1

## 2015-05-30 MED ORDER — VENLAFAXINE HCL 37.5 MG PO TABS
75.0000 mg | ORAL_TABLET | Freq: Two times a day (BID) | ORAL | Status: DC
  Filled 2015-05-30 (×2): qty 1

## 2015-05-30 MED ORDER — INSULIN GLARGINE 100 UNIT/ML ~~LOC~~ SOLN: 10.0000 [IU] | Freq: Every day | SUBCUTANEOUS | Status: DC

## 2015-05-30 MED ORDER — PRAVASTATIN SODIUM 40 MG PO TABS: 40.0000 mg | ORAL_TABLET | Freq: Every day | ORAL | Status: DC

## 2015-05-30 MED ORDER — LOSARTAN POTASSIUM 25 MG PO TABS
25.0000 mg | ORAL_TABLET | Freq: Every day | ORAL | Status: DC
  Administered 2015-05-30: 25 mg via ORAL
  Filled 2015-05-30: qty 1

## 2015-05-30 NOTE — ED Provider Notes (Signed)
[facility] Emergency Department Provider Note  ____________________________________________  Time seen: 1:00 AM  I have reviewed the triage vital signs and the nursing notes.   HISTORY  Chief Complaint Psychiatric Evaluation    HPI Erika Jacobs is a 65 y.o. female presents with visual and auditory hallucinations times "months". Patient denies any suicidal or homicidal ideations at this time. Patient also states that she's had feelings that "people are trying to hurt her"     Past Medical History  Diagnosis Date  . Diabetes mellitus without complication ([place])   . Panic attack   . Hypertension   . Hypercholesteremia   . GERD (gastroesophageal reflux disease)   . Hypothyroid   . Overactive bladder   . Arthritis   . Anemia   . Shortness of breath   . Bipolar 1 disorder [facility])     Patient Active Problem List   Diagnosis Date Noted  . Major depressive disorder, recurrent episode, severe, with psychotic behavior ([place])   . Severe manic bipolar 1 disorder with psychotic behavior ([place]) 10/20/2014  . Dyslipidemia 10/18/2014  . GERD (gastroesophageal reflux disease) 10/18/2014  . Hypothyroidism 10/18/2014  . Diabetes ([place]) 10/17/2014  . Hypertension 10/17/2014  . Urinary incontinence 10/17/2014  . Suicidal ideation   . Cholecystitis with cholelithiasis 06/02/2013    Past Surgical History  Procedure Laterality Date  . Tubal ligation    . Eye surgery Left   . Colonoscopy with esophagogastroduodenoscopy (egd) N/A 05/20/2013    Procedure: COLONOSCOPY WITH ESOPHAGOGASTRODUODENOSCOPY (EGD);  Surgeon: Rogene Houston, MD;  Location: [facility] ENDO SU[facility];  Service: Endoscopy;  Laterality: N/A;  730  . Cholecystectomy N/A 06/02/2013    Procedure: LAPAROSCOPIC CHOLECYSTECTOMY;  Surgeon: Scherry Ran, MD;  Location: [facility] ORS;  Service: General;  Laterality: N/A;    Current Outpatient Rx  Name  Route  Sig  Dispense  Refill  . amLODipine (NORVASC) 5 MG  tablet   Oral   Take 1 tablet (5 mg total) by mouth daily.   30 tablet   0   . ARIPiprazole (ABILIFY) 15 MG tablet   Oral   Take 1 tablet (15 mg total) by mouth at bedtime.   30 tablet   0   . Cholecalciferol (VITAMIN D) 2000 UNITS CAPS   Oral   Take 2,000 Units by mouth daily.          . insulin glargine (LANTUS) 100 UNIT/ML injection   Subcutaneous   Inject 0.09 mLs (9 Units total) into the skin at bedtime.   10 mL   0   . iron polysaccharides (NIFEREX) 150 MG capsule   Oral   Take 150 mg by mouth daily.         . metFORMIN (GLUCOPHAGE) 500 MG tablet   Oral   Take 500 mg by mouth 2 (two) times daily with a meal.          . omeprazole (PRILOSEC) 20 MG capsule   Oral   Take 20 mg by mouth daily.         Marland Kitchen oxybutynin (DITROPAN) 5 MG tablet   Oral   Take 5 mg by mouth daily.         . pravastatin (PRAVACHOL) 40 MG tablet   Oral   Take 40 mg by mouth daily.         Marland Kitchen venlafaxine XR (EFFEXOR-XR) 75 MG 24 hr capsule   Oral   Take 1 capsule (75 mg total) by mouth daily  with breakfast.   30 capsule   0   . zolpidem (AMBIEN) 5 MG tablet   Oral   Take 1 tablet (5 mg total) by mouth at bedtime as needed for sleep.   15 tablet   0     Allergies Review of patient's allergies indicates no known allergies.  History reviewed. No pertinent family history.  Social History Social History  Substance Use Topics  . Smoking status: Former Smoker -- 2.00 packs/day for 30 years    Types: Cigarettes    Quit date: 05/20/2002  . Smokeless tobacco: None  . Alcohol Use: No    Review of Systems  Constitutional: Negative for fever. Eyes: Negative for visual changes. ENT: Negative for sore throat. Cardiovascular: Negative for chest pain. Respiratory: Negative for shortness of breath. Gastrointestinal: Negative for abdominal pain, vomiting and diarrhea. Genitourinary: Negative for dysuria. Musculoskeletal: Negative for back pain. Skin: Negative for  rash. Neurological: Negative for headaches, focal weakness or numbness.   10-point ROS otherwise negative.  ____________________________________________   PHYSICAL EXAM:  VITAL SIGNS: ED Triage Vitals  Enc Vitals Group     BP 05/29/15 2305 159/88 mmHg     Pulse Rate 05/29/15 2305 103     Resp 05/29/15 2305 18     Temp 05/29/15 2305 98 F (36.7 C)     Temp Source 05/29/15 2305 Oral     SpO2 05/29/15 2305 98 %     Weight 05/29/15 2305 196 lb (88.905 kg)     Height 05/29/15 2305 5' (1.524 m)     Head Cir --      Peak Flow --      Pain Score 05/29/15 2308 10     Pain Loc --      Pain Edu? --      Excl. in [place]? --     Constitutional: Alert and oriented. Well appearing and in no distress. Eyes: Conjunctivae are normal. PERRL. Normal extraocular movements. ENT   Head: Normocephalic and atraumatic.   Nose: No congestion/rhinnorhea.   Mouth/Throat: Mucous membranes are moist.   Neck: No stridor. Hematological/Lymphatic/Immunilogical: No cervical lymphadenopathy. Cardiovascular: Normal rate, regular rhythm. Normal and symmetric distal pulses are present in all extremities. No murmurs, rubs, or gallops. Respiratory: Normal respiratory effort without tachypnea nor retractions. Breath sounds are clear and equal bilaterally. No wheezes/rales/rhonchi. Gastrointestinal: Soft and nontender. No distention. There is no CVA tenderness. Genitourinary: deferred Musculoskeletal: Nontender with normal range of motion in all extremities. No joint effusions.  No lower extremity tenderness nor edema. Neurologic:  Normal speech and language. No gross focal neurologic deficits are appreciated. Speech is normal.  Skin:  Skin is warm, dry and intact. No rash noted. Psychiatric: Mood and affect are normal. Speech and behavior are normal. Patient exhibits appropriate insight and judgment.  ____________________________________________    LABS (pertinent positives/negatives)  Labs  Reviewed  COMPREHENSIVE METABOLIC PANEL - Abnormal; Notable for the following:    Glucose, Bld 247 (*)    Creatinine, Ser 1.18 (*)    AST 45 (*)    ALT 63 (*)    GFR calc non Af Amer 48 (*)    GFR calc Af Amer 56 (*)    All other components within normal limits  CBC WITH DIFFERENTIAL/PLATELET - Abnormal; Notable for the following:    Hemoglobin 11.8 (*)    MCV 78.5 (*)    MCH 25.0 (*)    MCHC 31.8 (*)    RDW 15.9 (*)    All other  components within normal limits  URINE DRUG SCREEN, QUALITATIVE ([facility] ONLY) - Abnormal; Notable for the following:    Benzodiazepine, Ur Scrn POSITIVE (*)    All other components within normal limits  URINALYSIS COMPLETEWITH MICROSCOPIC ([facility] ONLY) - Abnormal; Notable for the following:    Color, Urine YELLOW (*)    APPearance CLEAR (*)    Glucose, UA 50 (*)    Ketones, ur TRACE (*)    Leukocytes, UA 1+ (*)    Squamous Epithelial / LPF 0-5 (*)    All other components within normal limits  GLUCOSE, CAPILLARY - Abnormal; Notable for the following:    Glucose-Capillary 180 (*)    All other components within normal limits  ETHANOL      INITIAL IMPRESSION / ASSESSMENT AND PLAN / ED COURSE  Pertinent labs & imaging results that were available during my care of the patient were reviewed by me and considered in my medical decision making (see chart for details).  Patient with visual and auditory hallucinations as well as paranoid delusions awaiting psychiatric consultation. ____________________________________________   FINAL CLINICAL IMPRESSION(S) / ED DIAGNOSES  Final diagnoses:  Paranoia ([place])      Gregor Hams, MD 06/01/15 [phone]

## 2015-05-30 NOTE — BH Assessment (Signed)
Referral information for Geriatric Placement have been faxed to;    [facility]([place]-[phone]),    [facility]([place]-[phone]),    [facility]([phone]),    [facility]([phone]),    [phone]).

## 2015-05-30 NOTE — ED Notes (Signed)
Patient observed lying in bed with eyes closed  Even, unlabored respirations observed   NAD pt appears to be sleeping  I will continue to monitor along with every 15 minute visual observations and ongoing security monitoring    

## 2015-05-30 NOTE — BH Assessment (Signed)
Writer called and informed the patient's son [phone]) was transported to [facility].

## 2015-05-30 NOTE — ED Notes (Signed)
ED BHU [place] Is the patient under IVC or is there intent for IVC: no Is the patient medically cleared: Yes.   Is there vacancy in the ED BHU: Yes.   Is the population mix appropriate for patient: Yes.   Is the patient awaiting placement in inpatient or outpatient setting:  Has the patient had a psychiatric consult:  Consult pending   Survey of unit performed for contraband, proper placement and condition of furniture, tampering with fixtures in bathroom, shower, and each patient room: Yes.  ; Findings:  APPEARANCE/BEHAVIOR Calm and cooperative, talkative NEURO ASSESSMENT Orientation: oriented x3  Denies pain Hallucinations: No.None noted (Hallucinations)  Paranoia present Speech: Normal Gait: normal RESPIRATORY ASSESSMENT Even  Unlabored respirations  CARDIOVASCULAR ASSESSMENT Pulses equal   regular rate  Skin warm and dry   GASTROINTESTINAL ASSESSMENT no GI complaint EXTREMITIES Full ROM  - generalized weakness PLAN OF CARE Provide calm/safe environment. Vital signs assessed twice daily. ED BHU Assessment once each 12-hour shift. Collaborate with intake RN daily or as condition indicates. Assure the ED provider has rounded once each shift. Provide and encourage hygiene. Provide redirection as needed. Assess for escalating behavior; address immediately and inform ED provider.  Assess family dynamic and appropriateness for visitation as needed: Yes.  ; If necessary, describe findings:  Educate the patient/family about BHU procedures/visitation: Yes.  ; If necessary, describe findings:

## 2015-05-30 NOTE — Progress Notes (Signed)
LCSW met with patient briefly, she was polite and gave LCSW verbal consent to speak to D- her daughter in law. She did not want to talk further and asked 3x who I was. LCSW agreed to come back later. Consulted with ED Nurse, patient is very paranoid and suspicious of people. She was oriented to person time and place and was able to state she was brought in to ED because she thought everyone was going to get her. She resumed eating her breakfast.  LCSW Deberah Adolf [place]  [phone]

## 2015-05-30 NOTE — BH Assessment (Signed)
Spoke with patient son ([phone]) about the patient going to [facility]. He further reported, the patient hasn't been eating like she supposed to and it is affecting her diabetes and HTN.  He also states she's been having hallucinations, seeing police are outside of her home. Son states, the patient believes she is being followed by the [facility] and under investigation. She also believes she is going to get arrested and detained in another country and her family will not know. Patient has going approximately 2 months without her psychotropic medications. They noticed a change in her behavior, approximately 2 weeks ago. She have a history of stopping her medications when she "start feeling better." She was recently in the ER, with [facility] [place], [place]) but was sent home. "Their doctors really don't know about mental (health) stuff, so they sent her home."  Information forwarded to ER MD (Dr. Jimmye Norman) .

## 2015-05-30 NOTE — ED Notes (Signed)
She has ambulated to the BR with a steady gait  No assistance required

## 2015-05-30 NOTE — BH Assessment (Signed)
Patient has been accepted to [facility].  Patient assigned to [place] Accepting physician is Dr.Gisela Dareen Piano.  Call report to [phone].  Representative was [employer].  ER Staff is aware of it Ricarda Frame., ER Sect. & Dr. Jimmye Norman, ER MD)     Patient's Daughter-N-Law ([place] [phone]) have been updated as well.

## 2015-05-30 NOTE — ED Notes (Signed)
BEHAVIORAL HEALTH ROUNDING Patient sleeping: No. Patient alert and oriented: yes Behavior appropriate: Yes.  ; If no, describe:  Nutrition and fluids offered: yes Toileting and hygiene offered: Yes  Sitter present: q15 minute observations and security  monitoring Law enforcement present: Yes  ODS  

## 2015-05-30 NOTE — ED Notes (Signed)
Supper provided Pt observed lying in bed    Pt visualized with NAD  No verbalized needs or concerns at this time  Continue to monitor

## 2015-05-30 NOTE — ED Notes (Signed)
See downtime sheet

## 2015-05-30 NOTE — ED Notes (Addendum)
Pharm[facility] to get up to date med list   She has eaten some cereal due to the breakfast that came from the kitchen was too hard for her to eat without her dentures  Assessment completed  Pt denies pain at this time  Paranoia present   CBG 180

## 2015-05-30 NOTE — ED Notes (Signed)
Lunch provided  Pt observed with no unusual behavior  Appropriate to stimulation  No verbalized needs or concerns at this time  NAD assessed  Continue to monitor

## 2015-05-30 NOTE — ED Notes (Signed)
meds have been updated by [employer]  Orders received and pt informed   [place] Patient sleeping: No. Patient alert and oriented: yes Behavior appropriate: Yes.  ; If no, describe:  Nutrition and fluids offered: yes Toileting and hygiene offered: Yes  Sitter present: q15 minute observations and security monitoring Law enforcement present: Yes  ODS

## 2015-05-30 NOTE — Progress Notes (Signed)
   05/30/15 0030  Clinical Encounter Type  Visited With Patient  Visit Type Initial;Psychological support;Spiritual support  Referral From Nurse  Consult/Referral To Chaplain  Spiritual Encounters  Spiritual Needs Other (Comment);Prayer (Pastoral counseling)  Stress Factors  Patient Stress Factors Family relationships;Loss of control;Major life changes  Met w/patient to assess source of emotional distress. Patient was concerned that family was trying to harm her. She became calmer throughout the visit. Requested chaplain follow up if she was admitted to [facility]. Chap. Jaelie Aguilera G. [place]

## 2015-05-30 NOTE — ED Notes (Signed)
Breakfast provided  Pt resting quietly with her eyes closed  Even respirations observed

## 2015-05-30 NOTE — ED Notes (Signed)
[employer] officer [place] is here to transport her to [facility]

## 2015-05-30 NOTE — Progress Notes (Signed)
In consultation with TTS- Patient was referred out for Geri-psych and a bed was offered for [place]

## 2015-05-30 NOTE — ED Notes (Signed)

## 2015-05-30 NOTE — BH Assessment (Signed)
Assessment Note  Erika Jacobs is an 65 y.o. female presenting to the ED voluntarily, accompanied by her daughter.  Pt states that she is under a lot of stress because "everybody is after her".  When asked to elaborate on who is after her, pt keeps repeating "just everybody.  Pt states she has been unable to get any rest because of the people after her.  She denies any SI or HI.     Diagnosis: Hallucinations  Past Medical History:  Past Medical History  Diagnosis Date  . Diabetes mellitus without complication ([place])   . Panic attack   . Hypertension   . Hypercholesteremia   . GERD (gastroesophageal reflux disease)   . Hypothyroid   . Overactive bladder   . Arthritis   . Anemia   . Shortness of breath   . Bipolar 1 disorder [facility])     Past Surgical History  Procedure Laterality Date  . Tubal ligation    . Eye surgery Left   . Colonoscopy with esophagogastroduodenoscopy (egd) N/A 05/20/2013    Procedure: COLONOSCOPY WITH ESOPHAGOGASTRODUODENOSCOPY (EGD);  Surgeon: Rogene Houston, MD;  [facility];  Service: Endoscopy;  Laterality: N/A;  730  . Cholecystectomy N/A 06/02/2013    Procedure: LAPAROSCOPIC CHOLECYSTECTOMY;  Surgeon: Scherry Ran, MD;  Location: [facility] ORS;  Service: General;  Laterality: N/A;    Family History: History reviewed. No pertinent family history.  Social History:  reports that she quit smoking about 13 years ago. Her smoking use included Cigarettes. She has a 60 pack-year smoking history. She does not have any smokeless tobacco history on file. She reports that she does not drink alcohol or use illicit drugs.  Additional Social History:  Alcohol / Drug Use History of alcohol / drug use?: No history of alcohol / drug abuse  CIWA: CIWA-Ar BP: (!) 159/88 mmHg Pulse Rate: (!) 103 COWS:    Allergies: Not on File  Home Medications:  (Not in a hospital admission)  OB/GYN Status:  No LMP recorded (lmp unknown). Patient is  postmenopausal.  General Assessment Data Location of Assessment: [facility] ED TTS Assessment: In system Is this a Tele or Face-to-Face Assessment?: Face-to-Face Is this an Initial Assessment or a Re-assessment for this encounter?: Initial Assessment Marital status: Single [place] name: N/A Is patient pregnant?: No Pregnancy Status: No Living Arrangements: Alone Can pt return to current living arrangement?: Yes Admission Status: Voluntary Is patient capable of signing voluntary admission?: Yes Referral Source: Self/Family/Friend Insurance type: Medicare     Crisis Care Plan Living Arrangements: Alone Legal Guardian: Other: (self) Name of Psychiatrist: None reported Name of Therapist: None reported  Education Status Is patient currently in school?: No Current Grade: N/A Highest grade of school patient has completed: N/A Name of school: N/A Contact person: N/A  Risk to self with the past 6 months Suicidal Ideation: No Has patient been a risk to self within the past 6 months prior to admission? : No Suicidal Intent: No Has patient had any suicidal intent within the past 6 months prior to admission? : No Is patient at risk for suicide?: No Suicidal Plan?: No Has patient had any suicidal plan within the past 6 months prior to admission? : No Access to Means: No What has been your use of drugs/alcohol within the last 12 months?: None reported Previous Attempts/Gestures: Yes How many times?: 1 Other Self Harm Risks: None reported Triggers for Past Attempts: Unknown Intentional Self Injurious Behavior: None Family Suicide History: No  Recent stressful life event(s): Other (Comment) Persecutory voices/beliefs?: No Depression: Yes Depression Symptoms: Isolating, Feeling worthless/self pity Substance abuse history and/or treatment for substance abuse?: No Suicide prevention information given to non-admitted patients: Not applicable  Risk to Others within the past 6  months Homicidal Ideation: No Does patient have any lifetime risk of violence toward others beyond the six months prior to admission? : No Thoughts of Harm to Others: No Current Homicidal Intent: No Current Homicidal Plan: No Access to Homicidal Means: No Identified Victim: None identified History of harm to others?: No Assessment of Violence: None Noted Violent Behavior Description: None identified Does patient have access to weapons?: No Criminal Charges Pending?: No Does patient have a court date: No Is patient on probation?: No  Psychosis Hallucinations: Visual Delusions: Persecutory  Mental Status Report Appearance/Hygiene: In scrubs Eye Contact: Good Motor Activity: Freedom of movement Speech: Unremarkable Level of Consciousness: Alert Mood: Suspicious, Fearful Affect: Fearful, Apprehensive Anxiety Level: Moderate Thought Processes: Flight of Ideas Judgement: Partial Orientation: Person, Place Obsessive Compulsive Thoughts/Behaviors: None  Cognitive Functioning Concentration: Good Memory: Recent Intact, Remote Intact IQ: Average Insight: Fair Impulse Control: Fair Appetite: Good Weight Loss: 0 Weight Gain: 0 Sleep: Decreased Total Hours of Sleep: 4 Vegetative Symptoms: None  ADLScreening [facility] Assessment Services) Patient's cognitive ability adequate to safely complete daily activities?: Yes Patient able to express need for assistance with ADLs?: Yes Independently performs ADLs?: Yes (appropriate for developmental age)  Prior Inpatient Therapy Prior Inpatient Therapy: Yes Prior Therapy Dates: 09/2014 Prior Therapy Facilty/Provider(s): [facility] Reason for Treatment: Suicidal  Prior Outpatient Therapy Prior Outpatient Therapy: No Prior Therapy Dates: N/A Prior Therapy Facilty/Provider(s): N/A Reason for Treatment: N/A Does patient have an ACCT team?: No Does patient have Intensive In-House Services?  : No Does patient have [facility] services? : No Does  patient have P4CC services?: No  ADL Screening (condition at time of admission) Patient's cognitive ability adequate to safely complete daily activities?: Yes Patient able to express need for assistance with ADLs?: Yes Independently performs ADLs?: Yes (appropriate for developmental age)       Abuse/Neglect Assessment (Assessment to be complete while patient is alone) Physical Abuse: Denies Verbal Abuse: Denies Sexual Abuse: Denies Exploitation of patient/patient's resources: Denies Self-Neglect: Denies Values / Beliefs Cultural Requests During Hospitalization: None Spiritual Requests During Hospitalization: None Consults Spiritual Care Consult Needed: No Social Work Consult Needed: No Regulatory affairs officer (For Healthcare) Does patient have an advance directive?: No Would patient like information on creating an advanced directive?: No - patient declined information    Additional Information 1:1 In Past 12 Months?: No CIRT Risk: No Elopement Risk: No Does patient have medical clearance?: No     Disposition:  Disposition Initial Assessment Completed for this Encounter: Yes Disposition of Patient: Other dispositions Other disposition(s): Other (Comment) (Psych MD consult)  On Site Evaluation by:   Reviewed with Physician:    Catha Nottingham Terris Germano 05/30/2015 12:16 AM

## 2015-05-30 NOTE — Consult Note (Signed)
[facility] Consult   Reason for Consult:  Consult for this 65 year old woman with a history of bipolar disorder presents to the hospital with psychotic paranoid ideation Referring Physician:  Marcelene Butte Patient Identification: Erika Jacobs MRN:  [id number] Principal Diagnosis: Major depressive disorder, recurrent episode, severe, with psychotic behavior ([place]) Diagnosis:   Patient Active Problem List   Diagnosis Date Noted  . Major depressive disorder, recurrent episode, severe, with psychotic behavior ([place]) [F33.3]   . Severe manic bipolar 1 disorder with psychotic behavior ([place]) [F31.2] 10/20/2014  . Dyslipidemia [E78.5] 10/18/2014  . GERD (gastroesophageal reflux disease) [K21.9] 10/18/2014  . Hypothyroidism [E03.9] 10/18/2014  . Diabetes ([place]) [E11.9] 10/17/2014  . Hypertension [I10] 10/17/2014  . Urinary incontinence [R32] 10/17/2014  . Suicidal ideation [R45.851]   . Cholecystitis with cholelithiasis [K80.10] 06/02/2013    Total Time spent with patient: 45 minutes  Subjective:   Erika Jacobs is a 65 y.o. female patient admitted with "I just don't want to be put in a straight jacket".  HPI:  Patient interviewed. Chart reviewed. Old notes reviewed. Labs reviewed. 65 year old woman brought to the emergency room by her daughter. Reports that she's been getting more paranoid and bizarre in her thinking. On interview today the patient tells me that brought here "for evaluation". She can't really tell me what needs to be evaluated. She repeats multiple times that she is afraid that the police are after her and are going to hurt her. She feels like people are going to put her into a straight jacket and try to hurt her. Patient is a poor historian as far as whether she is currently taking her medicine. Doesn't know her most recent psychiatric medicine. She said that she has been admitted several psychiatric hospitals in mentions that she's had hospitalizations since the  last time she was here.  Social history: Patient says she lives by herself in [place]. Has several adult children. Daughter was the one of brought her in. Patient appears to have what might be some delusional thoughts about some of her children but it's hard to be certain.  Medical history: Multiple medical problems including diabetes high blood pressure hypothyroidism chronic bladder incontinence gastric reflux.  Substance abuse history: Denies any alcohol or drug abuse history  Past Psychiatric History: Multiple prior hospitalizations including hospitalization here last summer. Diagnosis of bipolar disorder. Tends to present mostly with psychotic symptoms. Was treated last year with Abilify. She's been on Risperdal in the past and did not like it because she said that it made her have hallucinations. She says that she has tried to hurt himself and cut himself in the past.  Risk to Self: Suicidal Ideation: No Suicidal Intent: No Is patient at risk for suicide?: No Suicidal Plan?: No Access to Means: No What has been your use of drugs/alcohol within the last 12 months?: None reported How many times?: 1 Other Self Harm Risks: None reported Triggers for Past Attempts: Unknown Intentional Self Injurious Behavior: None Risk to Others: Homicidal Ideation: No Thoughts of Harm to Others: No Current Homicidal Intent: No Current Homicidal Plan: No Access to Homicidal Means: No Identified Victim: None identified History of harm to others?: No Assessment of Violence: None Noted Violent Behavior Description: None identified Does patient have access to weapons?: No Criminal Charges Pending?: No Does patient have a court date: No Prior Inpatient Therapy: Prior Inpatient Therapy: Yes Prior Therapy Dates: 09/2014 Prior Therapy Facilty/Provider(s): [facility] Reason for Treatment: Suicidal Prior Outpatient Therapy: Prior Outpatient Therapy: No  Prior Therapy Dates: N/A Prior Therapy Facilty/Provider(s):  N/A Reason for Treatment: N/A Does patient have an ACCT team?: No Does patient have Intensive In-House Services?  : No Does patient have [facility] services? : No Does patient have P4CC services?: No  Past Medical History:  Past Medical History  Diagnosis Date  . Diabetes mellitus without complication ([place])   . Panic attack   . Hypertension   . Hypercholesteremia   . GERD (gastroesophageal reflux disease)   . Hypothyroid   . Overactive bladder   . Arthritis   . Anemia   . Shortness of breath   . Bipolar 1 disorder [facility])     Past Surgical History  Procedure Laterality Date  . Tubal ligation    . Eye surgery Left   . Colonoscopy with esophagogastroduodenoscopy (egd) N/A 05/20/2013    Procedure: COLONOSCOPY WITH ESOPHAGOGASTRODUODENOSCOPY (EGD);  Surgeon: Rogene Houston, MD;  Location: [facility] ENDO [facility]ITE;  Service: Endoscopy;  Laterality: N/A;  730  . Cholecystectomy N/A 06/02/2013    Procedure: LAPAROSCOPIC CHOLECYSTECTOMY;  Surgeon: Scherry Ran, MD;  Location: [facility] ORS;  Service: General;  Laterality: N/A;   Family History: History reviewed. No pertinent family history. Family Psychiatric  History: Patient says there've been several people in her family who have mental health problems but she is at a loss to describe them. She denies any family history of suicide. Social History:  History  Alcohol Use No     History  Drug Use No    Social History   Social History  . Marital Status: Divorced    Spouse Name: N/A  . Number of Children: N/A  . Years of Education: N/A   Social History Main Topics  . Smoking status: Former Smoker -- 2.00 packs/day for 30 years    Types: Cigarettes    Quit date: 05/20/2002  . Smokeless tobacco: None  . Alcohol Use: No  . Drug Use: No  . Sexual Activity: Yes    Birth Control/ Protection: Post-menopausal   Other Topics Concern  . None   Social History Narrative   Additional Social History:    Allergies:  No Known Allergies  Labs:   Results for orders placed or performed during the hospital encounter of 05/29/15 (from the past 48 hour(s))  Comprehensive metabolic panel     Status: Abnormal   Collection Time: 05/29/15 11:14 PM  Result Value Ref Range   Sodium 141 135 - 145 mmol/L   Potassium 4.3 3.5 - 5.1 mmol/L   Chloride 107 101 - 111 mmol/L   CO2 24 22 - 32 mmol/L   Glucose, Bld 247 (H) 65 - 99 mg/dL   BUN 20 6 - 20 mg/dL   Creatinine, Ser 1.18 (H) 0.44 - 1.00 mg/dL   Calcium 9.6 8.9 - 10.3 mg/dL   Total Protein 8.1 6.5 - 8.1 g/dL   Albumin 4.0 3.5 - 5.0 g/dL   AST 45 (H) 15 - 41 U/L   ALT 63 (H) 14 - 54 U/L   Alkaline Phosphatase 65 38 - 126 U/L   Total Bilirubin 0.4 0.3 - 1.2 mg/dL   GFR calc non Af Amer 48 (L) >60 mL/min   GFR calc Af Amer 56 (L) >60 mL/min    Comment: (NOTE) The eGFR has been calculated using the CKD EPI equation. This calculation has not been validated in all clinical situations. eGFR's persistently <60 mL/min signify possible Chronic Kidney Disease.    Anion gap 10 5 - 15  Ethanol  Status: None   Collection Time: 05/29/15 11:14 PM  Result Value Ref Range   Alcohol, Ethyl (B) <5 <5 mg/dL    Comment:        LOWEST DETECTABLE LIMIT FOR SERUM ALCOHOL IS 5 mg/dL FOR MEDICAL PURPOSES ONLY   CBC with Diff     Status: Abnormal   Collection Time: 05/29/15 11:14 PM  Result Value Ref Range   WBC 8.0 3.6 - 11.0 K/uL   RBC 4.71 3.80 - 5.20 MIL/uL   Hemoglobin 11.8 (L) 12.0 - 16.0 g/dL   HCT 37.0 35.0 - 47.0 %   MCV 78.5 (L) 80.0 - 100.0 fL   MCH 25.0 (L) 26.0 - 34.0 pg   MCHC 31.8 (L) 32.0 - 36.0 g/dL   RDW 15.9 (H) 11.5 - 14.5 %   Platelets 228 150 - 440 K/uL   Neutrophils Relative % 65 %   Neutro Abs 5.2 1.4 - 6.5 K/uL   Lymphocytes Relative 25 %   Lymphs Abs 2.0 1.0 - 3.6 K/uL   Monocytes Relative 7 %   Monocytes Absolute 0.6 0.2 - 0.9 K/uL   Eosinophils Relative 2 %   Eosinophils Absolute 0.2 0 - 0.7 K/uL   Basophils Relative 1 %   Basophils Absolute 0.1 0 - 0.1 K/uL   Urine Drug Screen, Qualitative ([facility] only)     Status: Abnormal   Collection Time: 05/29/15 11:16 PM  Result Value Ref Range   Tricyclic, Ur Screen NONE DETECTED NONE DETECTED   Amphetamines, Ur Screen NONE DETECTED NONE DETECTED   MDMA (Ecstasy)Ur Screen NONE DETECTED NONE DETECTED   Cocaine Metabolite,Ur [place] NONE DETECTED NONE DETECTED   Opiate, Ur Screen NONE DETECTED NONE DETECTED   Phencyclidine (PCP) Ur S NONE DETECTED NONE DETECTED   Cannabinoid 50 Ng, Ur  NONE DETECTED NONE DETECTED   Barbiturates, Ur Screen NONE DETECTED NONE DETECTED   Benzodiazepine, Ur Scrn POSITIVE (A) NONE DETECTED   Methadone Scn, Ur NONE DETECTED NONE DETECTED    Comment: (NOTE) 045  Tricyclics, urine               Cutoff 1000 ng/mL 200  Amphetamines, urine             Cutoff 1000 ng/mL 300  MDMA (Ecstasy), urine           Cutoff 500 ng/mL 400  Cocaine Metabolite, urine       Cutoff 300 ng/mL 500  Opiate, urine                   Cutoff 300 ng/mL 600  Phencyclidine (PCP), urine      Cutoff 25 ng/mL 700  Cannabinoid, urine              Cutoff 50 ng/mL 800  Barbiturates, urine             Cutoff 200 ng/mL 900  Benzodiazepine, urine           Cutoff 200 ng/mL 1000 Methadone, urine                Cutoff 300 ng/mL 1100 1200 The urine drug screen provides only a preliminary, unconfirmed 1300 analytical test result and should not be used for non-medical 1400 purposes. Clinical consideration and professional judgment should 1500 be applied to any positive drug screen result due to possible 1600 interfering substances. A more specific alternate chemical method 1700 must be used in order to obtain a confirmed analytical result.  1800 Gas chromato  graphy / mass spectrometry (GC/MS) is the preferred 1900 confirmatory method.   Urinalysis complete, with microscopic ([facility] only)     Status: Abnormal   Collection Time: 05/29/15 11:16 PM  Result Value Ref Range   Color, Urine YELLOW (A) YELLOW   APPearance  CLEAR (A) CLEAR   Glucose, UA 50 (A) NEGATIVE mg/dL   Bilirubin Urine NEGATIVE NEGATIVE   Ketones, ur TRACE (A) NEGATIVE mg/dL   Specific Gravity, Urine 1.023 1.005 - 1.030   Hgb urine dipstick NEGATIVE NEGATIVE   pH 5.0 5.0 - 8.0   Protein, ur NEGATIVE NEGATIVE mg/dL   Nitrite NEGATIVE NEGATIVE   Leukocytes, UA 1+ (A) NEGATIVE   RBC / HPF 0-5 0 - 5 RBC/hpf   WBC, UA 0-5 0 - 5 WBC/hpf   Bacteria, UA NONE SEEN NONE SEEN   Squamous Epithelial / LPF 0-5 (A) NONE SEEN   Mucous PRESENT   Glucose, capillary     Status: Abnormal   Collection Time: 05/30/15  8:43 AM  Result Value Ref Range   Glucose-Capillary 180 (H) 65 - 99 mg/dL   Comment 1 Notify RN     Current Facility-Administered Medications  Medication Dose Route Frequency Provider Last Rate Last Dose  . amLODipine (NORVASC) tablet 5 mg  5 mg Oral Daily Earleen Newport, MD   5 mg at 05/30/15 1422  . ARIPiprazole (ABILIFY) tablet 15 mg  15 mg Oral Daily Gonzella Lex, MD      . insulin glargine (LANTUS) injection 10 Units  10 Units Subcutaneous QHS Earleen Newport, MD      . losartan (COZAAR) tablet 25 mg  25 mg Oral Daily Earleen Newport, MD   25 mg at 05/30/15 1420  . metFORMIN (GLUCOPHAGE) tablet 1,000 mg  1,000 mg Oral BID WC Earleen Newport, MD      . oxybutynin [facility]) tablet 5 mg  5 mg Oral q morning - 10a Earleen Newport, MD   5 mg at 05/30/15 1423  . pantoprazole (PROTONIX) EC tablet 40 mg  40 mg Oral Daily Earleen Newport, MD   40 mg at 05/30/15 1420  . pravastatin (PRAVACHOL) tablet 40 mg  40 mg Oral QHS Earleen Newport, MD      . venlafaxine [facility]) tablet 75 mg  75 mg Oral BID WC Earleen Newport, MD       Current Outpatient Prescriptions  Medication Sig Dispense Refill  . amLODipine (NORVASC) 5 MG tablet Take 1 tablet (5 mg total) by mouth daily. 30 tablet 0  . losartan (COZAAR) 25 MG tablet Take 1 tablet by mouth daily.    . metFORMIN (GLUCOPHAGE) 1000 MG tablet Take 1 tablet by  mouth 2 (two) times daily.    Marland Kitchen omeprazole (PRILOSEC) 20 MG capsule Take 20 mg by mouth daily.    Marland Kitchen oxybutynin (DITROPAN) 5 MG tablet Take 5 mg by mouth daily.    . pravastatin (PRAVACHOL) 40 MG tablet Take 40 mg by mouth daily.    . TRESIBA FLEXTOUCH 200 UNIT/ML SOPN Inject 10 Units into the skin daily.     Marland Kitchen venlafaxine (EFFEXOR) 75 MG tablet Take 1 tablet by mouth 2 (two) times daily.      Musculoskeletal: Strength & Muscle Tone: decreased Gait & Station: normal Patient leans: N/A  Psychiatric Specialty Exam: Review of Systems  Constitutional: Negative.   HENT: Negative.   Eyes: Negative.   Respiratory: Negative.   Cardiovascular: Negative.   Gastrointestinal: Negative.  Musculoskeletal: Negative.   Skin: Negative.   Neurological: Negative.   Psychiatric/Behavioral: Positive for depression and hallucinations. Negative for suicidal ideas, memory loss and substance abuse. The patient is nervous/anxious and has insomnia.     Blood pressure 159/88, pulse 93, temperature 98 F (36.7 C), temperature source Oral, resp. rate 18, height 5' (1.524 m), weight 88.905 kg (196 lb), SpO2 98 %.Body mass index is 38.28 kg/(m^2).  General Appearance: Disheveled  Eye Contact::  Fair  Speech:  Garbled  Volume:  Decreased  Mood:  Anxious and Dysphoric  Affect:  Flat  Thought Process:  Tangential  Orientation:  Full (Time, Place, and Person)  Thought Content:  Delusions and Paranoid Ideation  Suicidal Thoughts:  No  Homicidal Thoughts:  No  Memory:  Immediate;   Fair Recent;   Fair Remote;   Fair  Judgement:  Impaired  Insight:  Shallow  Psychomotor Activity:  Decreased  Concentration:  Poor  Recall:  Poor  Fund of Knowledge:Fair  Language: Fair  Akathisia:  No  Handed:  Right  AIMS (if indicated):     Assets:  Housing Social Support  ADL's:  Intact  Cognition: Impaired,  Mild  Sleep:      Treatment Plan Summary: Medication management and Plan This is a 65 year old woman with  a history of bipolar disorder who frequently present with psychotic symptoms. Currently paranoid. Somewhat agitated. Possibly medicine noncompliant. Unclear what her outpatient treatment is. Patient is having some memory problems as well. At the time that she was evaluated earlier we did not have beds available and she was referred to other facilities. She has been accepted at [facility] and transportation is being arranged. Patient needs hospital level treatment because of her psychosis. She had Artie been started back on multiple medications but I'm going to put her back on the Abilify. Patient advised to the plan and is agreeable.  Disposition: Recommend psychiatric Inpatient admission when medically cleared. Supportive therapy provided about ongoing stressors.  Alethia Berthold, MD 05/30/2015 4:02 PM

## 2015-06-15 DIAGNOSIS — Z418 Encounter for other procedures for purposes other than remedying health state: Secondary | ICD-10-CM | POA: Diagnosis not present

## 2015-06-15 DIAGNOSIS — F3130 Bipolar disorder, current episode depressed, mild or moderate severity, unspecified: Secondary | ICD-10-CM | POA: Diagnosis not present

## 2015-06-15 DIAGNOSIS — F39 Unspecified mood [affective] disorder: Secondary | ICD-10-CM | POA: Diagnosis not present

## 2015-06-15 DIAGNOSIS — E1142 Type 2 diabetes mellitus with diabetic polyneuropathy: Secondary | ICD-10-CM | POA: Diagnosis not present

## 2015-06-18 ENCOUNTER — Ambulatory Visit: Payer: Self-pay | Admitting: "Endocrinology

## 2015-06-18 DIAGNOSIS — F29 Unspecified psychosis not due to a substance or known physiological condition: Secondary | ICD-10-CM | POA: Diagnosis not present

## 2015-06-25 DIAGNOSIS — M7989 Other specified soft tissue disorders: Secondary | ICD-10-CM | POA: Diagnosis not present

## 2015-06-25 DIAGNOSIS — M79602 Pain in left arm: Secondary | ICD-10-CM | POA: Diagnosis not present

## 2015-07-20 DIAGNOSIS — Z1389 Encounter for screening for other disorder: Secondary | ICD-10-CM | POA: Diagnosis not present

## 2015-07-20 DIAGNOSIS — F3130 Bipolar disorder, current episode depressed, mild or moderate severity, unspecified: Secondary | ICD-10-CM | POA: Diagnosis not present

## 2015-07-20 DIAGNOSIS — E1142 Type 2 diabetes mellitus with diabetic polyneuropathy: Secondary | ICD-10-CM | POA: Diagnosis not present

## 2015-07-20 DIAGNOSIS — E7800 Pure hypercholesterolemia, unspecified: Secondary | ICD-10-CM | POA: Diagnosis not present

## 2015-07-26 ENCOUNTER — Ambulatory Visit: Payer: Self-pay | Admitting: "Endocrinology

## 2015-08-17 DIAGNOSIS — F29 Unspecified psychosis not due to a substance or known physiological condition: Secondary | ICD-10-CM | POA: Diagnosis not present

## 2015-08-31 DIAGNOSIS — E7800 Pure hypercholesterolemia, unspecified: Secondary | ICD-10-CM | POA: Diagnosis not present

## 2015-08-31 DIAGNOSIS — I10 Essential (primary) hypertension: Secondary | ICD-10-CM | POA: Diagnosis not present

## 2015-08-31 DIAGNOSIS — E119 Type 2 diabetes mellitus without complications: Secondary | ICD-10-CM | POA: Diagnosis not present

## 2015-10-09 DIAGNOSIS — E119 Type 2 diabetes mellitus without complications: Secondary | ICD-10-CM | POA: Diagnosis not present

## 2015-10-09 DIAGNOSIS — I10 Essential (primary) hypertension: Secondary | ICD-10-CM | POA: Diagnosis not present

## 2015-10-09 DIAGNOSIS — E7800 Pure hypercholesterolemia, unspecified: Secondary | ICD-10-CM | POA: Diagnosis not present

## 2015-10-26 DIAGNOSIS — M7550 Bursitis of unspecified shoulder: Secondary | ICD-10-CM | POA: Diagnosis not present

## 2015-10-26 DIAGNOSIS — E1142 Type 2 diabetes mellitus with diabetic polyneuropathy: Secondary | ICD-10-CM | POA: Diagnosis not present

## 2015-11-06 DIAGNOSIS — E1142 Type 2 diabetes mellitus with diabetic polyneuropathy: Secondary | ICD-10-CM | POA: Diagnosis not present

## 2015-11-06 DIAGNOSIS — K5790 Diverticulosis of intestine, part unspecified, without perforation or abscess without bleeding: Secondary | ICD-10-CM | POA: Diagnosis not present

## 2015-11-06 DIAGNOSIS — R079 Chest pain, unspecified: Secondary | ICD-10-CM | POA: Diagnosis not present

## 2015-11-06 DIAGNOSIS — E1169 Type 2 diabetes mellitus with other specified complication: Secondary | ICD-10-CM | POA: Diagnosis not present

## 2015-11-06 DIAGNOSIS — D509 Iron deficiency anemia, unspecified: Secondary | ICD-10-CM | POA: Diagnosis not present

## 2015-11-06 DIAGNOSIS — I129 Hypertensive chronic kidney disease with stage 1 through stage 4 chronic kidney disease, or unspecified chronic kidney disease: Secondary | ICD-10-CM | POA: Diagnosis not present

## 2015-11-06 DIAGNOSIS — G510 Bell's palsy: Secondary | ICD-10-CM | POA: Diagnosis not present

## 2015-11-06 DIAGNOSIS — Z833 Family history of diabetes mellitus: Secondary | ICD-10-CM | POA: Diagnosis not present

## 2015-11-06 DIAGNOSIS — Z7982 Long term (current) use of aspirin: Secondary | ICD-10-CM | POA: Diagnosis not present

## 2015-11-06 DIAGNOSIS — R1013 Epigastric pain: Secondary | ICD-10-CM | POA: Diagnosis not present

## 2015-11-06 DIAGNOSIS — Z812 Family history of tobacco abuse and dependence: Secondary | ICD-10-CM | POA: Diagnosis not present

## 2015-11-06 DIAGNOSIS — Z7984 Long term (current) use of oral hypoglycemic drugs: Secondary | ICD-10-CM | POA: Diagnosis not present

## 2015-11-06 DIAGNOSIS — K219 Gastro-esophageal reflux disease without esophagitis: Secondary | ICD-10-CM | POA: Diagnosis not present

## 2015-11-06 DIAGNOSIS — Z79899 Other long term (current) drug therapy: Secondary | ICD-10-CM | POA: Diagnosis not present

## 2015-11-06 DIAGNOSIS — R32 Unspecified urinary incontinence: Secondary | ICD-10-CM | POA: Diagnosis not present

## 2015-11-06 DIAGNOSIS — N189 Chronic kidney disease, unspecified: Secondary | ICD-10-CM | POA: Diagnosis not present

## 2015-11-06 DIAGNOSIS — E1122 Type 2 diabetes mellitus with diabetic chronic kidney disease: Secondary | ICD-10-CM | POA: Diagnosis not present

## 2015-11-06 DIAGNOSIS — E7800 Pure hypercholesterolemia, unspecified: Secondary | ICD-10-CM | POA: Diagnosis not present

## 2015-11-06 DIAGNOSIS — K2970 Gastritis, unspecified, without bleeding: Secondary | ICD-10-CM | POA: Diagnosis not present

## 2015-11-06 DIAGNOSIS — R0789 Other chest pain: Secondary | ICD-10-CM | POA: Diagnosis not present

## 2015-11-07 DIAGNOSIS — R110 Nausea: Secondary | ICD-10-CM | POA: Diagnosis not present

## 2015-11-07 DIAGNOSIS — K2970 Gastritis, unspecified, without bleeding: Secondary | ICD-10-CM | POA: Diagnosis not present

## 2015-11-07 DIAGNOSIS — D509 Iron deficiency anemia, unspecified: Secondary | ICD-10-CM | POA: Diagnosis not present

## 2015-11-07 DIAGNOSIS — R935 Abnormal findings on diagnostic imaging of other abdominal regions, including retroperitoneum: Secondary | ICD-10-CM | POA: Diagnosis not present

## 2015-11-07 DIAGNOSIS — Z7982 Long term (current) use of aspirin: Secondary | ICD-10-CM | POA: Diagnosis not present

## 2015-11-07 DIAGNOSIS — R079 Chest pain, unspecified: Secondary | ICD-10-CM | POA: Diagnosis not present

## 2015-11-08 DIAGNOSIS — R110 Nausea: Secondary | ICD-10-CM | POA: Diagnosis not present

## 2015-11-08 DIAGNOSIS — R935 Abnormal findings on diagnostic imaging of other abdominal regions, including retroperitoneum: Secondary | ICD-10-CM | POA: Diagnosis not present

## 2015-11-08 DIAGNOSIS — K2970 Gastritis, unspecified, without bleeding: Secondary | ICD-10-CM | POA: Diagnosis not present

## 2015-11-08 DIAGNOSIS — R079 Chest pain, unspecified: Secondary | ICD-10-CM | POA: Diagnosis not present

## 2015-11-08 DIAGNOSIS — Z7982 Long term (current) use of aspirin: Secondary | ICD-10-CM | POA: Diagnosis not present

## 2015-11-08 DIAGNOSIS — D509 Iron deficiency anemia, unspecified: Secondary | ICD-10-CM | POA: Diagnosis not present

## 2015-11-09 DIAGNOSIS — E669 Obesity, unspecified: Secondary | ICD-10-CM | POA: Diagnosis not present

## 2015-11-09 DIAGNOSIS — E119 Type 2 diabetes mellitus without complications: Secondary | ICD-10-CM | POA: Diagnosis not present

## 2015-11-09 DIAGNOSIS — N189 Chronic kidney disease, unspecified: Secondary | ICD-10-CM | POA: Diagnosis not present

## 2015-11-09 DIAGNOSIS — D649 Anemia, unspecified: Secondary | ICD-10-CM | POA: Diagnosis not present

## 2015-11-09 DIAGNOSIS — D509 Iron deficiency anemia, unspecified: Secondary | ICD-10-CM | POA: Diagnosis not present

## 2015-11-09 DIAGNOSIS — R935 Abnormal findings on diagnostic imaging of other abdominal regions, including retroperitoneum: Secondary | ICD-10-CM | POA: Diagnosis not present

## 2015-11-09 DIAGNOSIS — E1122 Type 2 diabetes mellitus with diabetic chronic kidney disease: Secondary | ICD-10-CM | POA: Diagnosis not present

## 2015-11-09 DIAGNOSIS — K2970 Gastritis, unspecified, without bleeding: Secondary | ICD-10-CM | POA: Diagnosis not present

## 2015-11-09 DIAGNOSIS — Z7982 Long term (current) use of aspirin: Secondary | ICD-10-CM | POA: Diagnosis not present

## 2015-11-09 DIAGNOSIS — K219 Gastro-esophageal reflux disease without esophagitis: Secondary | ICD-10-CM | POA: Diagnosis not present

## 2015-11-09 DIAGNOSIS — I129 Hypertensive chronic kidney disease with stage 1 through stage 4 chronic kidney disease, or unspecified chronic kidney disease: Secondary | ICD-10-CM | POA: Diagnosis not present

## 2015-11-09 DIAGNOSIS — E1169 Type 2 diabetes mellitus with other specified complication: Secondary | ICD-10-CM | POA: Diagnosis not present

## 2015-11-09 DIAGNOSIS — R079 Chest pain, unspecified: Secondary | ICD-10-CM | POA: Diagnosis not present

## 2015-11-09 DIAGNOSIS — E1142 Type 2 diabetes mellitus with diabetic polyneuropathy: Secondary | ICD-10-CM | POA: Diagnosis not present

## 2015-11-09 DIAGNOSIS — Z79899 Other long term (current) drug therapy: Secondary | ICD-10-CM | POA: Diagnosis not present

## 2015-11-09 DIAGNOSIS — I10 Essential (primary) hypertension: Secondary | ICD-10-CM | POA: Diagnosis not present

## 2015-11-09 DIAGNOSIS — E7800 Pure hypercholesterolemia, unspecified: Secondary | ICD-10-CM | POA: Diagnosis not present

## 2015-11-09 DIAGNOSIS — R110 Nausea: Secondary | ICD-10-CM | POA: Diagnosis not present

## 2015-11-11 DIAGNOSIS — Z9109 Other allergy status, other than to drugs and biological substances: Secondary | ICD-10-CM | POA: Diagnosis not present

## 2015-11-11 DIAGNOSIS — R0602 Shortness of breath: Secondary | ICD-10-CM | POA: Diagnosis not present

## 2015-11-11 DIAGNOSIS — R1013 Epigastric pain: Secondary | ICD-10-CM | POA: Diagnosis not present

## 2015-11-11 DIAGNOSIS — N189 Chronic kidney disease, unspecified: Secondary | ICD-10-CM | POA: Diagnosis not present

## 2015-11-11 DIAGNOSIS — F419 Anxiety disorder, unspecified: Secondary | ICD-10-CM | POA: Diagnosis not present

## 2015-11-11 DIAGNOSIS — Z7982 Long term (current) use of aspirin: Secondary | ICD-10-CM | POA: Diagnosis not present

## 2015-11-11 DIAGNOSIS — Z7984 Long term (current) use of oral hypoglycemic drugs: Secondary | ICD-10-CM | POA: Diagnosis not present

## 2015-11-11 DIAGNOSIS — E1122 Type 2 diabetes mellitus with diabetic chronic kidney disease: Secondary | ICD-10-CM | POA: Diagnosis not present

## 2015-11-11 DIAGNOSIS — K5900 Constipation, unspecified: Secondary | ICD-10-CM | POA: Diagnosis not present

## 2015-11-11 DIAGNOSIS — E1142 Type 2 diabetes mellitus with diabetic polyneuropathy: Secondary | ICD-10-CM | POA: Diagnosis not present

## 2015-11-11 DIAGNOSIS — I129 Hypertensive chronic kidney disease with stage 1 through stage 4 chronic kidney disease, or unspecified chronic kidney disease: Secondary | ICD-10-CM | POA: Diagnosis not present

## 2015-11-11 DIAGNOSIS — K2970 Gastritis, unspecified, without bleeding: Secondary | ICD-10-CM | POA: Diagnosis not present

## 2015-11-11 DIAGNOSIS — R32 Unspecified urinary incontinence: Secondary | ICD-10-CM | POA: Diagnosis not present

## 2015-11-11 DIAGNOSIS — E7800 Pure hypercholesterolemia, unspecified: Secondary | ICD-10-CM | POA: Diagnosis not present

## 2015-11-11 DIAGNOSIS — K219 Gastro-esophageal reflux disease without esophagitis: Secondary | ICD-10-CM | POA: Diagnosis not present

## 2015-11-12 DIAGNOSIS — F419 Anxiety disorder, unspecified: Secondary | ICD-10-CM | POA: Diagnosis not present

## 2015-11-12 DIAGNOSIS — R0602 Shortness of breath: Secondary | ICD-10-CM | POA: Diagnosis not present

## 2015-11-12 DIAGNOSIS — I129 Hypertensive chronic kidney disease with stage 1 through stage 4 chronic kidney disease, or unspecified chronic kidney disease: Secondary | ICD-10-CM | POA: Diagnosis not present

## 2015-11-12 DIAGNOSIS — I10 Essential (primary) hypertension: Secondary | ICD-10-CM | POA: Diagnosis not present

## 2015-11-16 ENCOUNTER — Emergency Department (HOSPITAL_COMMUNITY): Payer: Medicare Other

## 2015-11-16 ENCOUNTER — Encounter (HOSPITAL_COMMUNITY): Payer: Self-pay | Admitting: *Deleted

## 2015-11-16 ENCOUNTER — Emergency Department (HOSPITAL_COMMUNITY)
Admission: EM | Admit: 2015-11-16 | Discharge: 2015-11-16 | Disposition: A | Discharge status: Home / Self Care | Payer: Medicare Other | Attending: Emergency Medicine | Admitting: Emergency Medicine

## 2015-11-16 DIAGNOSIS — N189 Chronic kidney disease, unspecified: Secondary | ICD-10-CM | POA: Diagnosis not present

## 2015-11-16 DIAGNOSIS — F419 Anxiety disorder, unspecified: Secondary | ICD-10-CM | POA: Insufficient documentation

## 2015-11-16 DIAGNOSIS — I129 Hypertensive chronic kidney disease with stage 1 through stage 4 chronic kidney disease, or unspecified chronic kidney disease: Secondary | ICD-10-CM | POA: Insufficient documentation

## 2015-11-16 DIAGNOSIS — Z79899 Other long term (current) drug therapy: Secondary | ICD-10-CM | POA: Insufficient documentation

## 2015-11-16 DIAGNOSIS — F29 Unspecified psychosis not due to a substance or known physiological condition: Secondary | ICD-10-CM | POA: Diagnosis not present

## 2015-11-16 DIAGNOSIS — Z7982 Long term (current) use of aspirin: Secondary | ICD-10-CM | POA: Diagnosis not present

## 2015-11-16 DIAGNOSIS — R0602 Shortness of breath: Secondary | ICD-10-CM | POA: Diagnosis not present

## 2015-11-16 DIAGNOSIS — E039 Hypothyroidism, unspecified: Secondary | ICD-10-CM | POA: Diagnosis not present

## 2015-11-16 DIAGNOSIS — E119 Type 2 diabetes mellitus without complications: Secondary | ICD-10-CM | POA: Insufficient documentation

## 2015-11-16 DIAGNOSIS — R51 Headache: Secondary | ICD-10-CM | POA: Diagnosis not present

## 2015-11-16 DIAGNOSIS — Z87891 Personal history of nicotine dependence: Secondary | ICD-10-CM | POA: Diagnosis not present

## 2015-11-16 DIAGNOSIS — R079 Chest pain, unspecified: Secondary | ICD-10-CM | POA: Diagnosis not present

## 2015-11-16 DIAGNOSIS — R42 Dizziness and giddiness: Secondary | ICD-10-CM | POA: Diagnosis present

## 2015-11-16 HISTORY — DX: Other symptoms and signs involving cognitive functions and awareness: R41.89

## 2015-11-16 HISTORY — DX: Type 2 diabetes mellitus with diabetic polyneuropathy: E11.42

## 2015-11-16 HISTORY — DX: Other chronic pain: G89.29

## 2015-11-16 HISTORY — DX: Headache: R51

## 2015-11-16 HISTORY — DX: Delusional disorders: F22

## 2015-11-16 HISTORY — DX: Other constipation: K59.09

## 2015-11-16 HISTORY — DX: Headache, unspecified: R51.9

## 2015-11-16 HISTORY — DX: Unspecified psychosis not due to a substance or known physiological condition: F29

## 2015-11-16 HISTORY — DX: Gastroduodenitis, unspecified, without bleeding: K29.90

## 2015-11-16 HISTORY — DX: Chronic kidney disease, unspecified: N18.9

## 2015-11-16 HISTORY — DX: Anxiety disorder, unspecified: F41.9

## 2015-11-16 LAB — CBC
HCT: 31 % — ABNORMAL LOW (ref 36.0–46.0)
HEMOGLOBIN: 9.5 g/dL — AB (ref 12.0–15.0)
MCH: 22.1 pg — AB (ref 26.0–34.0)
MCHC: 30.6 g/dL (ref 30.0–36.0)
MCV: 72.3 fL — AB (ref 78.0–100.0)
Platelets: 281 10*3/uL (ref 150–400)
RBC: 4.29 MIL/uL (ref 3.87–5.11)
RDW: 17.7 % — ABNORMAL HIGH (ref 11.5–15.5)
WBC: 9.9 10*3/uL (ref 4.0–10.5)

## 2015-11-16 LAB — BASIC METABOLIC PANEL
ANION GAP: 10 (ref 5–15)
BUN: 34 mg/dL — ABNORMAL HIGH (ref 6–20)
CALCIUM: 9.3 mg/dL (ref 8.9–10.3)
CO2: 20 mmol/L — ABNORMAL LOW (ref 22–32)
Chloride: 103 mmol/L (ref 101–111)
Creatinine, Ser: 1.13 mg/dL — ABNORMAL HIGH (ref 0.44–1.00)
GFR, EST AFRICAN AMERICAN: 59 mL/min — AB (ref >60)
GFR, EST NON AFRICAN AMERICAN: 51 mL/min — AB (ref >60)
GLUCOSE: 183 mg/dL — AB (ref 65–99)
POTASSIUM: 4.3 mmol/L (ref 3.5–5.1)
SODIUM: 133 mmol/L — AB (ref 135–145)

## 2015-11-16 LAB — URINALYSIS, ROUTINE W REFLEX MICROSCOPIC
Bilirubin Urine: NEGATIVE
Glucose, UA: NEGATIVE mg/dL
HGB URINE DIPSTICK: NEGATIVE
Leukocytes, UA: NEGATIVE
Nitrite: NEGATIVE
Protein, ur: NEGATIVE mg/dL
SPECIFIC GRAVITY, URINE: 1.01 (ref 1.005–1.030)
pH: 5.5 (ref 5.0–8.0)

## 2015-11-16 LAB — TROPONIN I: Troponin I: <0.03 ng/mL (ref <0.03)

## 2015-11-16 LAB — CBG MONITORING, ED: GLUCOSE-CAPILLARY: 176 mg/dL — AB (ref 65–99)

## 2015-11-16 LAB — D-DIMER, QUANTITATIVE: D-Dimer, Quant: 0.47 ug/mL-FEU (ref 0.00–0.50)

## 2015-11-16 MED ORDER — DIAZEPAM 5 MG PO TABS
5.0000 mg | ORAL_TABLET | Freq: Once | ORAL | Status: AC
  Administered 2015-11-16: 5 mg via ORAL
  Filled 2015-11-16: qty 1

## 2015-11-16 MED ORDER — GI COCKTAIL ~~LOC~~
30.0000 mL | Freq: Once | ORAL | Status: AC
  Administered 2015-11-16: 30 mL via ORAL
  Filled 2015-11-16: qty 30

## 2015-11-16 NOTE — ED Notes (Signed)
Pt states she has been in [facility] twice in the past week . States they could bot find anything wrong with her. States she was told it was anxiety and she needed to follow up with her psychiatrics. States she went back to him and told her it was not anxiety. States she took valium just before coming to the ED. Pt complain of being dizzy at present

## 2015-11-16 NOTE — Discharge Instructions (Signed)
Take your usual prescriptions as previously directed.  Call your regular medical doctor today to schedule a follow up appointment this week. Call your mental health provider tomorrow to schedule a follow up appointment within the next week.  Return to the Emergency Department immediately sooner if worsening.

## 2015-11-16 NOTE — ED Provider Notes (Signed)
[facility] DEPT Provider Note   CSN: [id number] Arrival date & time: 11/16/15  1141  First Provider Contact:  None       History   Chief Complaint Chief Complaint  Patient presents with  . Dizziness    HPI CURISSA GALUSKA is a 65 y.o. female.  HPI  Pt was seen at 1225. Per pt, c/o gradual onset and persistence of constant multiple complaints for the past 1 week. Pt's complaints include: chest "pain," SOB, "dizziness." Pt is concerned about "my heart rate, my blood pressure, my iron levels, my red blood cell counts and my kidney's shutting down." Pt states her symptoms began after she "was discharged from [facility] on Sunday" (5 days ago). Pt has been evaluated at [facility] twice this past week for these same complaints. Pt states they "put a camera down my throat and said something was inflamed." Pt states she was also told to follow up with her mental health provider for anxiety. Pt states she followed up with her mental health provider who told her it was not anxiety. Pt does endorse she took a valium PTA. Pt denies any of her symptoms have changed over the past week. Denies palpitations, no cough, no abd pain, no N/V/D, no visual changes, no focal motor weakness, no tingling/numbness in extremities, no ataxia, no slurred speech, no facial droop.    Past Medical History:  Diagnosis Date  . Anemia   . Anxiety   . Arthritis   . Bipolar 1 disorder ([place])   . Chronic constipation   . Chronic headache   . CKD (chronic kidney disease)   . Diabetes mellitus without complication ([place])   . Diabetic polyneuropathy ([place])   . Gastritis and duodenitis   . GERD (gastroesophageal reflux disease)   . Hypercholesteremia   . Hypertension   . Hypothyroid   . Overactive bladder   . Panic attack   . Paranoia ([place])   . Psychosis   . Shortness of breath   . Thought disorder     Patient Active Problem List   Diagnosis Date Noted  . 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 Severe manic bipolar 1 disorder with psychotic behavior ([place]) 10/20/2014  . Dyslipidemia 10/18/2014  . GERD (gastroesophageal reflux disease) 10/18/2014  . Hypothyroidism 10/18/2014  . Diabetes ([place]) 10/17/2014  . Hypertension 10/17/2014  . Urinary incontinence 10/17/2014  . Suicidal ideation   . Cholecystitis with cholelithiasis 06/02/2013    Past Surgical History:  Procedure Laterality Date  . CHOLECYSTECTOMY N/A 06/02/2013   Procedure: LAPAROSCOPIC CHOLECYSTECTOMY;  Surgeon: Scherry Ran, MD;  Location: [facility] ORS;  Service: General;  Laterality: N/A;  . COLONOSCOPY WITH ESOPHAGOGASTRODUODENOSCOPY (EGD) N/A 05/20/2013   Procedure: COLONOSCOPY WITH ESOPHAGOGASTRODUODENOSCOPY (EGD);  Surgeon: Rogene Houston, MD;  Location: [facility] ENDO SUITE;  Service: Endoscopy;  Laterality: N/A;  730  . EYE SURGERY Left   . TUBAL LIGATION      OB History    Gravida Para Term Preterm AB Living   4         3   SAB TAB Ectopic Multiple Live Births                   Home Medications    Prior to Admission medications   Medication Sig Start Date End Date Taking? Authorizing Provider  amLODipine (NORVASC) 5 MG tablet Take 1 tablet (5 mg total) by mouth daily. 10/24/14   Hildred Priest, MD  losartan (COZAAR) 25 MG tablet Take 1 tablet by mouth daily. 05/25/15   Historical Provider, MD  metFORMIN (GLUCOPHAGE) 1000 MG tablet Take 1 tablet by mouth 2 (two) times daily. 05/24/15   Historical Provider, MD  omeprazole (PRILOSEC) 20 MG capsule Take 20 mg by mouth daily. 09/21/14   Historical Provider, MD  oxybutynin (DITROPAN) 5 MG tablet Take 5 mg by mouth daily.    Historical Provider, MD  pravastatin (PRAVACHOL) 40 MG tablet Take 40 mg by mouth daily.    Historical Provider, MD  TRESIBA FLEXTOUCH 200 UNIT/ML SOPN Inject 10 Units into the skin daily.  05/25/15   Historical Provider, MD  venlafaxine (EFFEXOR) 75 MG tablet Take 1 tablet by mouth 2 (two) times daily. 05/24/15    Historical Provider, MD    Family History No family history on file.  Social History Social History  Substance Use Topics  . Smoking status: Former Smoker    Packs/day: 2.00    Years: 30.00    Types: Cigarettes    Quit date: 05/20/2002  . Smokeless tobacco: Never Used  . Alcohol use No     Allergies   Aripiprazole and Lisinopril   Review of Systems Review of Systems ROS: Statement: All systems negative except as marked or noted in the HPI; Constitutional: Negative for fever and chills. ; ; Eyes: Negative for eye pain, redness and discharge. ; ; ENMT: Negative for ear pain, hoarseness, nasal congestion, sinus pressure and sore throat. ; ; Cardiovascular: Negative for palpitations, diaphoresis, and peripheral edema. ; ; Respiratory: +SOB. Negative for cough, wheezing and stridor. ; ; Gastrointestinal: +CP. Negative for nausea, vomiting, diarrhea, abdominal pain, blood in stool, hematemesis, jaundice and rectal bleeding. . ; ; Genitourinary: Negative for dysuria, flank pain and hematuria. ; ; Musculoskeletal: Negative for back pain and neck pain. Negative for swelling and trauma.; ; Skin: Negative for pruritus, rash, abrasions, blisters, bruising and skin lesion.; ; Neuro: +"dizziness." Negative for headache, lightheadedness and neck stiffness. Negative for weakness, altered level of consciousness, altered mental status, extremity weakness, paresthesias, involuntary movement, seizure and syncope.       Physical Exam Updated Vital Signs BP 175/75 (BP Location: Left Arm)   Pulse 118   Temp 98.6 F (37 C) (Oral)   Resp 18   Ht 5' (1.524 m)   Wt 203 lb (92.1 kg)   LMP  (LMP Unknown)   SpO2 97%   BMI 39.65 kg/m     13:40 Orthostatic Vital Signs VP  Orthostatic Lying   BP- Lying: 142/70  Pulse- Lying: 112      Orthostatic Sitting  BP- Sitting: 140/72  Pulse- Sitting: 107      Orthostatic Standing at 0 minutes  BP- Standing at 0 minutes: 129/79  Pulse- Standing at 0  minutes: 122   Patient Vitals for the past 24 hrs:  BP Temp Temp src Pulse Resp SpO2 Height Weight  11/16/15 1600 108/57 - - 95 15 99 % - -  11/16/15 1432 122/55 - - 101 15 98 % - -  11/16/15 1148 175/75 98.6 F (37 C) Oral 118 18 97 % 5' (1.524 m) 203 lb (92.1 kg)     Physical Exam 1230: Physical examination:  Nursing notes reviewed; Vital signs and O2 SAT reviewed;  Constitutional: Well developed, Well nourished, Well hydrated, In no acute distress; Head:  Normocephalic, atraumatic; Eyes: EOMI, PERRL, No scleral icterus; ENMT: Mouth and pharynx normal, Mucous membranes moist; Neck: Supple, Full range of motion, No lymphadenopathy;  Cardiovascular: Regular rate and rhythm, [place]; Respiratory: Breath sounds clear & equal bilaterally, No wheezes.  Speaking full sentences with ease, Normal respiratory effort/excursion; Chest: Nontender, Movement normal; Abdomen: Soft, Nontender, Nondistended, Normal bowel sounds; Genitourinary: No CVA tenderness; Extremities: Pulses normal, No tenderness, No edema, No calf edema or asymmetry.; Neuro: AA&Ox3, Major CN grossly intact. Speech clear.  No facial droop.  No nystagmus. Grips equal. Strength 5/5 equal bilat UE's and LE's.  DTR 2/4 equal bilat UE's and LE's.  No gross sensory deficits.  Normal cerebellar testing bilat UE's (finger-nose) and LE's (heel-shin)..; Skin: Color normal, Warm, Dry.; Psych:  Anxious, rapid speech.      ED Treatments / Results  Labs (all labs ordered are listed, but only abnormal results are displayed)   EKG  EKG Interpretation  Date/Time:  Friday November 16 2015 11:55:31 EDT Ventricular Rate:  102 PR Interval:    QRS Duration: 85 QT Interval:  336 QTC Calculation: 438 R Axis:   22 Text Interpretation:  Sinus tachycardia No significant change was found Confirmed by Wyvonnia Dusky  MD, STEPHEN [phone]) on 11/16/2015 12:27:33 PM       Radiology   Procedures Procedures (including critical care time)  Medications Ordered in  ED Medications  gi cocktail (Maalox,Lidocaine,Donnatal) (not administered)     Initial Impression / Assessment and Plan / ED Course  I have reviewed the triage vital signs and the nursing notes.  Pertinent labs & imaging results that were available during my care of the patient were reviewed by me and considered in my medical decision making (see chart for details).  MDM Reviewed: previous chart, nursing note and vitals Reviewed previous: labs and ECG Interpretation: labs, ECG, x-ray and CT scan    Results for orders placed or performed during the hospital encounter of [id number]  Basic metabolic panel  Result Value Ref Range   Sodium 133 (L) 135 - 145 mmol/L   Potassium 4.3 3.5 - 5.1 mmol/L   Chloride 103 101 - 111 mmol/L   CO2 20 (L) 22 - 32 mmol/L   Glucose, Bld 183 (H) 65 - 99 mg/dL   BUN 34 (H) 6 - 20 mg/dL   Creatinine, Ser 1.13 (H) 0.44 - 1.00 mg/dL   Calcium 9.3 8.9 - 10.3 mg/dL   GFR calc non Af Amer 51 (L) >60 mL/min   GFR calc Af Amer 59 (L) >60 mL/min   Anion gap 10 5 - 15  CBC  Result Value Ref Range   WBC 9.9 4.0 - 10.5 K/uL   RBC 4.29 3.87 - 5.11 MIL/uL   Hemoglobin 9.5 (L) 12.0 - 15.0 g/dL   HCT 31.0 (L) 36.0 - 46.0 %   MCV 72.3 (L) 78.0 - 100.0 fL   MCH 22.1 (L) 26.0 - 34.0 pg   MCHC 30.6 30.0 - 36.0 g/dL   RDW 17.7 (H) 11.5 - 15.5 %   Platelets 281 150 - 400 K/uL  Urinalysis, Routine w reflex microscopic  Result Value Ref Range   Color, Urine YELLOW YELLOW   APPearance CLEAR CLEAR   Specific Gravity, Urine 1.010 1.005 - 1.030   pH 5.5 5.0 - 8.0   Glucose, UA NEGATIVE NEGATIVE mg/dL   Hgb urine dipstick NEGATIVE NEGATIVE   Bilirubin Urine NEGATIVE NEGATIVE   Ketones, ur TRACE (A) NEGATIVE mg/dL   Protein, ur NEGATIVE NEGATIVE mg/dL   Nitrite NEGATIVE NEGATIVE   Leukocytes, UA NEGATIVE NEGATIVE  Troponin I  Result Value Ref Range   Troponin I <0.03 <0.03  ng/mL  D-dimer, quantitative  Result Value Ref Range   D-Dimer, Quant 0.47 0.00 - 0.50  ug/mL-FEU  CBG monitoring, ED  Result Value Ref Range   Glucose-Capillary 176 (H) 65 - 99 mg/dL   Dg Chest 2 View Result Date: 11/16/2015 CLINICAL DATA:  Dizziness and shortness of breath for several days. EXAM: CHEST  2 VIEW COMPARISON:  11/11/2015 FINDINGS: The heart size and mediastinal contours are within normal limits. Both lungs are clear. The visualized skeletal structures are unremarkable. IMPRESSION: No active cardiopulmonary disease. Electronically Signed   By: Fidela Salisbury M.D.   On: 11/16/2015 13:12   1345:  Pt[facility]: Pt was admitted for epigastric pain and was noted to have gastritis on EGD; after discharge, pt returned again to the hospital for abd pain and nausea, dx with symptoms likely due to her anxiety;  H/H: 9.6/32.8; BUN/Cr: 29/1.42 on 11/11/15.  1400:  Pt informed of her reassuring labs. Pt immediately then began to c/o more, multiple symptoms, including "what about when I walk and my heart rate goes to 200?" When asked how she knew this she could not answer me. I, again, reassured pt regarding her workup today. Pt with significant anxiety; will dose valium.   1700:  Pt's workup reassuring. Pt less anxious now. Has been talking on the telephone for the past several hours. Pt has tol PO well while in the ED without N/V. Pt has ambulated with steady gait. Doubt PE as cause for symptoms with normal d-dimer and low risk [place].  Doubt ACS as cause for symptoms with normal troponin and unchanged EKG from previous after 1 week of constant symptoms. Pt states she feels better and wants to go home now. Endorses she "doesn't really take the valium much;" pt encouraged to take her meds as prescribed. Dx and testing d/w pt.  Questions answered.  Verb understanding, agreeable to d/c home with outpt f/u.      Final Clinical Impressions(s) / ED Diagnoses   Final diagnoses:  None    New Prescriptions New Prescriptions   No medications on file       Francine Graven, DO 11/20/15 0002

## 2015-11-16 NOTE — ED Notes (Signed)
Pt keeps insisting there is something wrong with her blood pressure. States when she walks across the room she becomes short of breath. When pt ambulated to the bathroom she was not short of breat and her 02 sat was 97 % on room

## 2015-11-16 NOTE — ED Notes (Signed)
Pt waiting on urine result so disposition can be made

## 2015-11-16 NOTE — ED Notes (Signed)
Patient ambulated in hall.  o2 sat maintained at 94-96% Patient stated she "was not having any trouble walking".

## 2015-11-16 NOTE — ED Triage Notes (Signed)
Pt comes in for back back that she states is chronic. Pt then states she is having dizziness and difficulty breathing. Pt had no distress upon triage. She was hospitalized last Friday for a heart problem.

## 2015-11-16 NOTE — ED Notes (Signed)
Pt assisted to bathroom. Pt complain of being dizzy when back in room. Pt ambulatory with steady gait

## 2015-11-19 DIAGNOSIS — E119 Type 2 diabetes mellitus without complications: Secondary | ICD-10-CM | POA: Diagnosis not present

## 2015-11-19 DIAGNOSIS — I10 Essential (primary) hypertension: Secondary | ICD-10-CM | POA: Diagnosis not present

## 2015-11-19 DIAGNOSIS — E7800 Pure hypercholesterolemia, unspecified: Secondary | ICD-10-CM | POA: Diagnosis not present

## 2015-11-20 DIAGNOSIS — F200 Paranoid schizophrenia: Secondary | ICD-10-CM | POA: Diagnosis not present

## 2015-11-20 DIAGNOSIS — K219 Gastro-esophageal reflux disease without esophagitis: Secondary | ICD-10-CM | POA: Diagnosis present

## 2015-11-20 DIAGNOSIS — E119 Type 2 diabetes mellitus without complications: Secondary | ICD-10-CM | POA: Diagnosis present

## 2015-11-20 DIAGNOSIS — E7800 Pure hypercholesterolemia, unspecified: Secondary | ICD-10-CM | POA: Diagnosis present

## 2015-11-20 DIAGNOSIS — Z794 Long term (current) use of insulin: Secondary | ICD-10-CM | POA: Diagnosis not present

## 2015-11-20 DIAGNOSIS — F333 Major depressive disorder, recurrent, severe with psychotic symptoms: Secondary | ICD-10-CM | POA: Diagnosis present

## 2015-11-20 DIAGNOSIS — N186 End stage renal disease: Secondary | ICD-10-CM | POA: Diagnosis present

## 2015-11-20 DIAGNOSIS — I10 Essential (primary) hypertension: Secondary | ICD-10-CM | POA: Diagnosis present

## 2015-12-19 DIAGNOSIS — F29 Unspecified psychosis not due to a substance or known physiological condition: Secondary | ICD-10-CM | POA: Diagnosis not present

## 2015-12-20 DIAGNOSIS — Z299 Encounter for prophylactic measures, unspecified: Secondary | ICD-10-CM | POA: Diagnosis not present

## 2015-12-20 DIAGNOSIS — Z1389 Encounter for screening for other disorder: Secondary | ICD-10-CM | POA: Diagnosis not present

## 2015-12-20 DIAGNOSIS — F3130 Bipolar disorder, current episode depressed, mild or moderate severity, unspecified: Secondary | ICD-10-CM | POA: Diagnosis not present

## 2015-12-20 DIAGNOSIS — E1122 Type 2 diabetes mellitus with diabetic chronic kidney disease: Secondary | ICD-10-CM | POA: Diagnosis not present

## 2015-12-20 DIAGNOSIS — E7800 Pure hypercholesterolemia, unspecified: Secondary | ICD-10-CM | POA: Diagnosis not present

## 2015-12-20 DIAGNOSIS — D649 Anemia, unspecified: Secondary | ICD-10-CM | POA: Diagnosis not present

## 2015-12-20 DIAGNOSIS — R0602 Shortness of breath: Secondary | ICD-10-CM | POA: Diagnosis not present

## 2015-12-20 DIAGNOSIS — F39 Unspecified mood [affective] disorder: Secondary | ICD-10-CM | POA: Diagnosis not present

## 2015-12-31 DIAGNOSIS — I10 Essential (primary) hypertension: Secondary | ICD-10-CM | POA: Diagnosis not present

## 2015-12-31 DIAGNOSIS — N183 Chronic kidney disease, stage 3 (moderate): Secondary | ICD-10-CM | POA: Diagnosis not present

## 2015-12-31 DIAGNOSIS — R0602 Shortness of breath: Secondary | ICD-10-CM | POA: Diagnosis not present

## 2015-12-31 DIAGNOSIS — Z713 Dietary counseling and surveillance: Secondary | ICD-10-CM | POA: Diagnosis not present

## 2015-12-31 DIAGNOSIS — Z6841 Body Mass Index (BMI) 40.0 and over, adult: Secondary | ICD-10-CM | POA: Diagnosis not present

## 2016-01-09 DIAGNOSIS — F29 Unspecified psychosis not due to a substance or known physiological condition: Secondary | ICD-10-CM | POA: Diagnosis not present

## 2016-01-22 ENCOUNTER — Encounter: Payer: Self-pay | Admitting: *Deleted

## 2016-01-23 ENCOUNTER — Ambulatory Visit (INDEPENDENT_AMBULATORY_CARE_PROVIDER_SITE_OTHER): Payer: Medicare Other | Admitting: Cardiovascular Disease

## 2016-01-23 ENCOUNTER — Encounter: Payer: Self-pay | Admitting: Cardiovascular Disease

## 2016-01-23 ENCOUNTER — Encounter: Payer: Self-pay | Admitting: *Deleted

## 2016-01-23 VITALS — BP 115/74 | HR 88 | Ht 60.0 in | Wt 203.0 lb

## 2016-01-23 DIAGNOSIS — Z23 Encounter for immunization: Secondary | ICD-10-CM | POA: Diagnosis not present

## 2016-01-23 DIAGNOSIS — I10 Essential (primary) hypertension: Secondary | ICD-10-CM

## 2016-01-23 DIAGNOSIS — R0609 Other forms of dyspnea: Secondary | ICD-10-CM | POA: Diagnosis not present

## 2016-01-23 DIAGNOSIS — E7800 Pure hypercholesterolemia, unspecified: Secondary | ICD-10-CM | POA: Diagnosis not present

## 2016-01-23 NOTE — Progress Notes (Signed)
CARDIOLOGY CONSULT NOTE  Patient ID: Erika Jacobs MRN: [id number] DOB/AGE: [birth year] 65 y.o.  Admit date: (Not on file) Primary Physician: Monico Blitz, MD Referring Physician:   Reason for Consultation: SOB  HPI: 65 yr old woman referred for SOB. She has a history of diabetes, hypertension, and hyperlipidemia. She says she has not been short of breath in the last 1-1/2 months. She will intermittently experience shortness of breath walking to the mailbox. She says she does not exercise. She says her feet were swollen approximately 2 months ago but they have since gone down. She denies chest pain with exertion. She occasionally has a burning sensation in her chest after eating.  She reportedly had an echocardiogram at her PCPs office but I do not have a copy of these results. She also reported had a stress test at [facility] earlier this year and I do not have a copy of these results either.  She was told by someone at [facility] that she had a silent myocardial infarction.  ECG 11/16/15 showed sinus tachycardia, heart rate 102 bpm.   Allergies  Allergen Reactions  . Aripiprazole Other (See Comments)    Pt shakes when taking this medication.  Lorayne Bender [Paliperidone]     High doses causes patient to shake  . Lisinopril Other (See Comments)    Kidney   . Risperidone And Related     hallucinations    Current Outpatient Prescriptions  Medication Sig Dispense Refill  . aspirin EC 81 MG tablet Take 81 mg by mouth daily.    . benztropine (COGENTIN) 1 MG tablet Take 1 tablet by mouth 2 (two) times daily.    . Calcium Carbonate-Vit D-Min (CALCIUM 1200 PO) Take 1 tablet by mouth daily.    . Cholecalciferol (VITAMIN D3) 5000 units TABS Take 5,000 Units by mouth daily.    . Cinnamon 500 MG capsule Take 500 mg by mouth daily.    . Cyanocobalamin (VITAMIN B 12 PO) Take 1 tablet by mouth daily.    Marland Kitchen docusate sodium (COLACE) 100 MG capsule Take 100 mg by mouth daily.    .  ferrous sulfate 325 (65 FE) MG tablet Take 325 mg by mouth daily with breakfast.    . Garlic 6063 MG CAPS Take 1 capsule by mouth daily.    Marland Kitchen losartan (COZAAR) 25 MG tablet Take 1 tablet by mouth daily.    . metFORMIN (GLUCOPHAGE) 1000 MG tablet Take 1 tablet by mouth 2 (two) times daily.    . metoprolol succinate (TOPROL-XL) 25 MG 24 hr tablet Take 1 tablet by mouth daily.    . Multiple Vitamin (MULTIVITAMIN) tablet Take 1 tablet by mouth daily.    . Omega-3 Fatty Acids (FISH OIL) 1200 MG CAPS Take 1,200 mg by mouth daily.    Marland Kitchen omeprazole (PRILOSEC) 20 MG capsule Take 20 mg by mouth daily.    Marland Kitchen oxybutynin (DITROPAN) 5 MG tablet Take 5 mg by mouth daily.    . paliperidone (INVEGA) 6 MG 24 hr tablet Take 1 tablet by mouth daily.    . pravastatin (PRAVACHOL) 40 MG tablet Take 40 mg by mouth daily.    . TRESIBA FLEXTOUCH 200 UNIT/ML SOPN Inject 34 Units into the skin daily.      No current facility-administered medications for this visit.     Past Medical History:  Diagnosis Date  . Anemia   . Anxiety   . Arthritis   . Bipolar 1 disorder ([place])   .  Chronic constipation   . Chronic headache   . CKD (chronic kidney disease)   . Diabetes mellitus without complication ([place])   . Diabetic polyneuropathy ([place])   . Gastritis and duodenitis   . GERD (gastroesophageal reflux disease)   . Hypercholesteremia   . Hypertension   . Hypothyroid   . Overactive bladder   . Panic attack   . Paranoia ([place])   . Psychosis   . Shortness of breath   . Thought disorder     Past Surgical History:  Procedure Laterality Date  . CHOLECYSTECTOMY N/A 06/02/2013   Procedure: LAPAROSCOPIC CHOLECYSTECTOMY;  Surgeon: Scherry Ran, MD;  Location: [facility] ORS;  Service: General;  Laterality: N/A;  . COLONOSCOPY WITH ESOPHAGOGASTRODUODENOSCOPY (EGD) N/A 05/20/2013   Procedure: COLONOSCOPY WITH ESOPHAGOGASTRODUODENOSCOPY (EGD);  Surgeon: Rogene Houston, MD;  Location: [facility] ENDO SUITE;  Service: Endoscopy;   Laterality: N/A;  730  . EYE SURGERY Left   . TUBAL LIGATION      Social History   Social History  . Marital status: Divorced    Spouse name: N/A  . Number of children: N/A  . Years of education: N/A   Occupational History  . Not on file.   Social History Main Topics  . Smoking status: Former Smoker    Packs/day: 2.00    Years: 30.00    Types: Cigarettes    Start date: 10/18/1967    Quit date: 05/20/2002  . Smokeless tobacco: Never Used  . Alcohol use No  . Drug use: No  . Sexual activity: Yes    Birth control/ protection: Post-menopausal   Other Topics Concern  . Not on file   Social History Narrative  . No narrative on file     No family history of premature CAD in 1st degree relatives.  Prior to Admission medications   Medication Sig Start Date End Date Taking? Authorizing Provider  amLODipine (NORVASC) 5 MG tablet Take 1 tablet (5 mg total) by mouth daily. 10/24/14   Hildred Priest, MD  Ascorbic Acid (VITAMIN C) 1000 MG tablet Take 1,000 mg by mouth daily.    Historical Provider, MD  aspirin EC 81 MG tablet Take 81 mg by mouth daily.    Historical Provider, MD  Calcium Carbonate-Vit D-Min (CALCIUM 1200 PO) Take 1 tablet by mouth daily.    Historical Provider, MD  Cholecalciferol (VITAMIN D3) 5000 units TABS Take 5,000 Units by mouth daily.    Historical Provider, MD  Cinnamon 500 MG capsule Take 500 mg by mouth daily.    Historical Provider, MD  Cyanocobalamin (VITAMIN B 12 PO) Take 1 tablet by mouth daily.    Historical Provider, MD  diazepam (VALIUM) 5 MG tablet Take 5 mg by mouth every 12 (twelve) hours as needed for anxiety.    Historical Provider, MD  docusate sodium (COLACE) 100 MG capsule Take 100 mg by mouth daily.    Historical Provider, MD  Garlic 6578 MG CAPS Take 1 capsule by mouth daily.    Historical Provider, MD  losartan (COZAAR) 25 MG tablet Take 1 tablet by mouth daily. 05/25/15   Historical Provider, MD  metFORMIN (GLUCOPHAGE) 1000 MG  tablet Take 1 tablet by mouth 2 (two) times daily. 05/24/15   Historical Provider, MD  Omega-3 Fatty Acids (FISH OIL) 1200 MG CAPS Take 1,200 mg by mouth daily.    Historical Provider, MD  omeprazole (PRILOSEC) 20 MG capsule Take 20 mg by mouth daily. 09/21/14   Historical Provider, MD  oxybutynin (  DITROPAN) 5 MG tablet Take 5 mg by mouth daily.    Historical Provider, MD  paliperidone (INVEGA) 3 MG 24 hr tablet Take 1.5 mg by mouth daily.    Historical Provider, MD  pravastatin (PRAVACHOL) 40 MG tablet Take 40 mg by mouth daily.    Historical Provider, MD  TRESIBA FLEXTOUCH 200 UNIT/ML SOPN Inject 34 Units into the skin daily.  05/25/15   Historical Provider, MD  venlafaxine (EFFEXOR) 75 MG tablet Take 1 tablet by mouth 2 (two) times daily. 05/24/15   Historical Provider, MD  vitamin E 400 UNIT capsule Take 400 Units by mouth daily.    Historical Provider, MD     Review of systems complete and found to be negative unless listed above in HPI     Physical exam Blood pressure 115/74, pulse 88, height 5' (1.524 m), weight 203 lb (92.1 kg). General: NAD Neck: No JVD, no thyromegaly or thyroid nodule.  Lungs: Clear to auscultation bilaterally with normal respiratory effort. CV: Nondisplaced PMI. Regular rate and rhythm, normal S1/S2, no S3/S4, no murmur.  No peripheral edema.  No carotid bruit.  Normal pedal pulses.  Abdomen: Soft, nontender, obese, no distention.  Skin: Intact without lesions or rashes.  Neurologic: Alert and oriented x 3.  Psych: Normal affect. Extremities: No clubbing or cyanosis.  HEENT: Normal.   ECG: Most recent ECG reviewed.  Labs:   Lab Results  Component Value Date   WBC 9.9 11/16/2015   HGB 9.5 (L) 11/16/2015   HCT 31.0 (L) 11/16/2015   MCV 72.3 (L) 11/16/2015   PLT 281 11/16/2015   No results for input(s): NA, K, CL, CO2, BUN, CREATININE, CALCIUM, PROT, BILITOT, ALKPHOS, ALT, AST, GLUCOSE in the last 168 hours.  Invalid input(s): LABALBU Lab Results    Component Value Date   TROPONINI <0.03 11/16/2015    Lab Results  Component Value Date   CHOL 116 10/18/2014   Lab Results  Component Value Date   HDL 63 10/18/2014   Lab Results  Component Value Date   LDLCALC 40 10/18/2014   Lab Results  Component Value Date   TRIG 67 10/18/2014   Lab Results  Component Value Date   CHOLHDL 1.8 10/18/2014   No results found for: LDLDIRECT       Studies: No results found.  ASSESSMENT AND PLAN:  1. SOB with exertion: Several CV risk factors. I will try and obtain a copy of her stress test and echocardiogram. She is currently asymptomatic and only last experienced symptoms 1.5 months ago. For the time being, continue ASA, beta blocker, and statin.  2. HTN: Controlled. I reviewed her BP log and it appears she has good control at home as well. No changes.  3. Hyperlipidemia: Continue statin.  Dispo: fu 3 months.   Signed: Kate Sable, M.D., F.A.C.C.  01/23/2016, 2:44 PM

## 2016-01-23 NOTE — Patient Instructions (Signed)
Medication Instructions:  Continue all current medications.  Labwork: none  Testing/Procedures: none  Follow-Up: 3 months   Any Other Special Instructions Will Be Listed Below (If Applicable).  If you need a refill on your cardiac medications before your next appointment, please call your pharmacy.  

## 2016-02-08 DIAGNOSIS — Z6836 Body mass index (BMI) 36.0-36.9, adult: Secondary | ICD-10-CM | POA: Diagnosis not present

## 2016-02-08 DIAGNOSIS — E1142 Type 2 diabetes mellitus with diabetic polyneuropathy: Secondary | ICD-10-CM | POA: Diagnosis not present

## 2016-02-08 DIAGNOSIS — Z7189 Other specified counseling: Secondary | ICD-10-CM | POA: Diagnosis not present

## 2016-02-08 DIAGNOSIS — Z0000 Encounter for general adult medical examination without abnormal findings: Secondary | ICD-10-CM | POA: Diagnosis not present

## 2016-02-08 DIAGNOSIS — R5383 Other fatigue: Secondary | ICD-10-CM | POA: Diagnosis not present

## 2016-02-08 DIAGNOSIS — Z299 Encounter for prophylactic measures, unspecified: Secondary | ICD-10-CM | POA: Diagnosis not present

## 2016-02-08 DIAGNOSIS — Z1211 Encounter for screening for malignant neoplasm of colon: Secondary | ICD-10-CM | POA: Diagnosis not present

## 2016-02-08 DIAGNOSIS — Z1389 Encounter for screening for other disorder: Secondary | ICD-10-CM | POA: Diagnosis not present

## 2016-02-12 DIAGNOSIS — E7800 Pure hypercholesterolemia, unspecified: Secondary | ICD-10-CM | POA: Diagnosis not present

## 2016-02-12 DIAGNOSIS — E119 Type 2 diabetes mellitus without complications: Secondary | ICD-10-CM | POA: Diagnosis not present

## 2016-02-12 DIAGNOSIS — I10 Essential (primary) hypertension: Secondary | ICD-10-CM | POA: Diagnosis not present

## 2016-02-18 DIAGNOSIS — F29 Unspecified psychosis not due to a substance or known physiological condition: Secondary | ICD-10-CM | POA: Diagnosis not present

## 2016-03-06 DIAGNOSIS — E7800 Pure hypercholesterolemia, unspecified: Secondary | ICD-10-CM | POA: Diagnosis not present

## 2016-03-06 DIAGNOSIS — I10 Essential (primary) hypertension: Secondary | ICD-10-CM | POA: Diagnosis not present

## 2016-03-06 DIAGNOSIS — E119 Type 2 diabetes mellitus without complications: Secondary | ICD-10-CM | POA: Diagnosis not present

## 2016-03-31 DIAGNOSIS — I10 Essential (primary) hypertension: Secondary | ICD-10-CM | POA: Diagnosis not present

## 2016-03-31 DIAGNOSIS — E7800 Pure hypercholesterolemia, unspecified: Secondary | ICD-10-CM | POA: Diagnosis not present

## 2016-03-31 DIAGNOSIS — E119 Type 2 diabetes mellitus without complications: Secondary | ICD-10-CM | POA: Diagnosis not present

## 2016-04-23 DIAGNOSIS — Z299 Encounter for prophylactic measures, unspecified: Secondary | ICD-10-CM | POA: Diagnosis not present

## 2016-04-23 DIAGNOSIS — F3130 Bipolar disorder, current episode depressed, mild or moderate severity, unspecified: Secondary | ICD-10-CM | POA: Diagnosis not present

## 2016-04-23 DIAGNOSIS — E1122 Type 2 diabetes mellitus with diabetic chronic kidney disease: Secondary | ICD-10-CM | POA: Diagnosis not present

## 2016-04-23 DIAGNOSIS — E1142 Type 2 diabetes mellitus with diabetic polyneuropathy: Secondary | ICD-10-CM | POA: Diagnosis not present

## 2016-04-23 DIAGNOSIS — H8309 Labyrinthitis, unspecified ear: Secondary | ICD-10-CM | POA: Diagnosis not present

## 2016-04-25 ENCOUNTER — Ambulatory Visit (INDEPENDENT_AMBULATORY_CARE_PROVIDER_SITE_OTHER): Payer: Medicare Other | Admitting: Cardiovascular Disease

## 2016-04-25 ENCOUNTER — Encounter: Payer: Self-pay | Admitting: Cardiovascular Disease

## 2016-04-25 VITALS — BP 150/80 | HR 104 | Ht 60.0 in | Wt 196.0 lb

## 2016-04-25 DIAGNOSIS — R0609 Other forms of dyspnea: Secondary | ICD-10-CM | POA: Diagnosis not present

## 2016-04-25 DIAGNOSIS — I10 Essential (primary) hypertension: Secondary | ICD-10-CM | POA: Diagnosis not present

## 2016-04-25 DIAGNOSIS — E7800 Pure hypercholesterolemia, unspecified: Secondary | ICD-10-CM | POA: Diagnosis not present

## 2016-04-25 NOTE — Patient Instructions (Signed)
Your physician recommends that you schedule a follow-up appointment in: AS NEEDED WITH DR KONESWARAN  Your physician recommends that you continue on your current medications as directed. Please refer to the Current Medication list given to you today.  Thank you for choosing [facility]!!    

## 2016-04-25 NOTE — Progress Notes (Signed)
SUBJECTIVE: The patient returns for follow-up of shortness of breath. She underwent a normal nuclear stress test in February 2017 an echocardiogram in September 2017 showed normal left ventricle systolic function and mild diastolic dysfunction. There were no significant valvular abnormalities.  She has had no further episodes with dyspnea.   Review of Systems: As per "subjective", otherwise negative.  Allergies  Allergen Reactions  . Aripiprazole Other (See Comments)    Pt shakes when taking this medication.  Lorayne Bender [Paliperidone]     High doses causes patient to shake  . Lisinopril Other (See Comments)    Kidney   . Risperidone And Related     hallucinations    Current Outpatient Prescriptions  Medication Sig Dispense Refill  . Amoxicillin (AMOXIL PO) Take by mouth. Started 04/24/16 - tid    . aspirin EC 81 MG tablet Take 81 mg by mouth daily.    . benztropine (COGENTIN) 1 MG tablet Take 1 tablet by mouth 2 (two) times daily.    . Calcium Carbonate-Vit D-Min (CALCIUM 1200 PO) Take 1 tablet by mouth daily.    . Cholecalciferol (VITAMIN D3) 5000 units TABS Take 5,000 Units by mouth daily.    . Cinnamon 500 MG capsule Take 500 mg by mouth daily.    . Cyanocobalamin (VITAMIN B 12 PO) Take 1 tablet by mouth daily.    Marland Kitchen docusate sodium (COLACE) 100 MG capsule Take 100 mg by mouth daily.    . ferrous sulfate 325 (65 FE) MG tablet Take 325 mg by mouth daily with breakfast.    . Garlic 9675 MG CAPS Take 1 capsule by mouth daily.    Marland Kitchen losartan (COZAAR) 25 MG tablet Take 1 tablet by mouth daily.    . metFORMIN (GLUCOPHAGE) 1000 MG tablet Take 1 tablet by mouth 2 (two) times daily.    . metoprolol succinate (TOPROL-XL) 25 MG 24 hr tablet Take 1 tablet by mouth daily.    . Multiple Vitamin (MULTIVITAMIN) tablet Take 1 tablet by mouth daily.    . Omega-3 Fatty Acids (FISH OIL) 1200 MG CAPS Take 1,200 mg by mouth daily.    Marland Kitchen omeprazole (PRILOSEC) 20 MG capsule Take 20 mg by mouth  daily.    Marland Kitchen oxybutynin (DITROPAN) 5 MG tablet Take 5 mg by mouth daily.    . paliperidone (INVEGA) 6 MG 24 hr tablet Take 1 tablet by mouth daily.    . pravastatin (PRAVACHOL) 40 MG tablet Take 40 mg by mouth daily.    . TRESIBA FLEXTOUCH 200 UNIT/ML SOPN Inject 34 Units into the skin daily.      No current facility-administered medications for this visit.     Past Medical History:  Diagnosis Date  . Anemia   . Anxiety   . Arthritis   . Bipolar 1 disorder ([place])   . Chronic constipation   . Chronic headache   . CKD (chronic kidney disease)   . Diabetes mellitus without complication ([place])   . Diabetic polyneuropathy ([place])   . Gastritis and duodenitis   . GERD (gastroesophageal reflux disease)   . Hypercholesteremia   . Hypertension   . Hypothyroid   . Overactive bladder   . Panic attack   . Paranoia ([place])   . Psychosis   . Shortness of breath   . Thought disorder     Past Surgical History:  Procedure Laterality Date  . CHOLECYSTECTOMY N/A 06/02/2013   Procedure: LAPAROSCOPIC CHOLECYSTECTOMY;  Surgeon: Scherry Ran, MD;  Location: [facility];  Service: General;  Laterality: N/A;  . COLONOSCOPY WITH ESOPHAGOGASTRODUODENOSCOPY (EGD) N/A 05/20/2013   Procedure: COLONOSCOPY WITH ESOPHAGOGASTRODUODENOSCOPY (EGD);  Surgeon: Rogene Houston, MD;  Location: [facility];  Service: Endoscopy;  Laterality: N/A;  730  . EYE SURGERY Left   . TUBAL LIGATION      Social History   Social History  . Marital status: Divorced    Spouse name: N/A  . Number of children: N/A  . Years of education: N/A   Occupational History  . Not on file.   Social History Main Topics  . Smoking status: Former Smoker    Packs/day: 2.00    Years: 30.00    Types: Cigarettes    Start date: 10/18/1967    Quit date: 05/20/2002  . Smokeless tobacco: Never Used  . Alcohol use No  . Drug use: No  . Sexual activity: Yes    Birth control/ protection: Post-menopausal   Other Topics Concern  . Not on  file   Social History Narrative  . No narrative on file     Vitals:   04/25/16 0901  BP: (!) 150/80  Pulse: (!) 104  SpO2: 96%  Weight: 196 lb (88.9 kg)  Height: 5' (1.524 m)    PHYSICAL EXAM General: NAD HEENT: Normal. Neck: No JVD, no thyromegaly. Lungs: Clear to auscultation bilaterally with normal respiratory effort. CV: Nondisplaced PMI.  Regular rate and rhythm, normal S1/S2, no S3/S4, no murmur. No pretibial or periankle edema.  No carotid bruit.   Abdomen: Soft, nontender, no distention.  Neurologic: Alert and oriented.  Psych: Normal affect. Skin: Normal. Musculoskeletal: No gross deformities.    ECG: Most recent ECG reviewed.      ASSESSMENT AND PLAN: 1. SOB with exertion: No further episodes. Several CV risk factors. Normal nuclear stress test 05/2015 and normal left ventricular systolic function by echocardiogram September 2017. Continue ASA, beta blocker, and statin.  2. HTN: Elevated today but normal at last visit. No changes.  3. Hyperlipidemia: Continue statin.  Dispo: fu prn  Kate Sable, M.D., F.A.C.C.

## 2016-04-28 DIAGNOSIS — I10 Essential (primary) hypertension: Secondary | ICD-10-CM | POA: Diagnosis not present

## 2016-04-28 DIAGNOSIS — E119 Type 2 diabetes mellitus without complications: Secondary | ICD-10-CM | POA: Diagnosis not present

## 2016-04-28 DIAGNOSIS — E7800 Pure hypercholesterolemia, unspecified: Secondary | ICD-10-CM | POA: Diagnosis not present

## 2016-05-21 DIAGNOSIS — F29 Unspecified psychosis not due to a substance or known physiological condition: Secondary | ICD-10-CM | POA: Diagnosis not present

## 2016-05-23 DIAGNOSIS — N183 Chronic kidney disease, stage 3 (moderate): Secondary | ICD-10-CM | POA: Diagnosis not present

## 2016-05-23 DIAGNOSIS — F3130 Bipolar disorder, current episode depressed, mild or moderate severity, unspecified: Secondary | ICD-10-CM | POA: Diagnosis not present

## 2016-05-23 DIAGNOSIS — I10 Essential (primary) hypertension: Secondary | ICD-10-CM | POA: Diagnosis not present

## 2016-05-23 DIAGNOSIS — Z299 Encounter for prophylactic measures, unspecified: Secondary | ICD-10-CM | POA: Diagnosis not present

## 2016-05-23 DIAGNOSIS — Z713 Dietary counseling and surveillance: Secondary | ICD-10-CM | POA: Diagnosis not present

## 2016-05-23 DIAGNOSIS — Z6835 Body mass index (BMI) 35.0-35.9, adult: Secondary | ICD-10-CM | POA: Diagnosis not present

## 2016-05-23 DIAGNOSIS — E1122 Type 2 diabetes mellitus with diabetic chronic kidney disease: Secondary | ICD-10-CM | POA: Diagnosis not present

## 2016-05-23 DIAGNOSIS — H8309 Labyrinthitis, unspecified ear: Secondary | ICD-10-CM | POA: Diagnosis not present

## 2016-05-23 DIAGNOSIS — Z789 Other specified health status: Secondary | ICD-10-CM | POA: Diagnosis not present

## 2016-05-28 DIAGNOSIS — N183 Chronic kidney disease, stage 3 (moderate): Secondary | ICD-10-CM | POA: Diagnosis not present

## 2016-05-28 DIAGNOSIS — I10 Essential (primary) hypertension: Secondary | ICD-10-CM | POA: Diagnosis not present

## 2016-05-28 DIAGNOSIS — E1122 Type 2 diabetes mellitus with diabetic chronic kidney disease: Secondary | ICD-10-CM | POA: Diagnosis not present

## 2016-05-28 DIAGNOSIS — F3130 Bipolar disorder, current episode depressed, mild or moderate severity, unspecified: Secondary | ICD-10-CM | POA: Diagnosis not present

## 2016-05-28 DIAGNOSIS — Z713 Dietary counseling and surveillance: Secondary | ICD-10-CM | POA: Diagnosis not present

## 2016-05-28 DIAGNOSIS — Z299 Encounter for prophylactic measures, unspecified: Secondary | ICD-10-CM | POA: Diagnosis not present

## 2016-05-28 DIAGNOSIS — Z6834 Body mass index (BMI) 34.0-34.9, adult: Secondary | ICD-10-CM | POA: Diagnosis not present

## 2016-06-03 DIAGNOSIS — R030 Elevated blood-pressure reading, without diagnosis of hypertension: Secondary | ICD-10-CM | POA: Diagnosis not present

## 2016-06-03 DIAGNOSIS — Z87891 Personal history of nicotine dependence: Secondary | ICD-10-CM | POA: Diagnosis not present

## 2016-06-03 DIAGNOSIS — E119 Type 2 diabetes mellitus without complications: Secondary | ICD-10-CM | POA: Diagnosis not present

## 2016-06-03 DIAGNOSIS — Z79899 Other long term (current) drug therapy: Secondary | ICD-10-CM | POA: Diagnosis not present

## 2016-06-03 DIAGNOSIS — K219 Gastro-esophageal reflux disease without esophagitis: Secondary | ICD-10-CM | POA: Diagnosis not present

## 2016-06-03 DIAGNOSIS — F419 Anxiety disorder, unspecified: Secondary | ICD-10-CM | POA: Diagnosis not present

## 2016-06-03 DIAGNOSIS — Z794 Long term (current) use of insulin: Secondary | ICD-10-CM | POA: Diagnosis not present

## 2016-06-03 DIAGNOSIS — R51 Headache: Secondary | ICD-10-CM | POA: Diagnosis not present

## 2016-06-03 DIAGNOSIS — Z7982 Long term (current) use of aspirin: Secondary | ICD-10-CM | POA: Diagnosis not present

## 2016-06-03 DIAGNOSIS — E7800 Pure hypercholesterolemia, unspecified: Secondary | ICD-10-CM | POA: Diagnosis not present

## 2016-06-03 DIAGNOSIS — I10 Essential (primary) hypertension: Secondary | ICD-10-CM | POA: Diagnosis not present

## 2016-06-04 DIAGNOSIS — I10 Essential (primary) hypertension: Secondary | ICD-10-CM | POA: Diagnosis not present

## 2016-06-04 DIAGNOSIS — Z6834 Body mass index (BMI) 34.0-34.9, adult: Secondary | ICD-10-CM | POA: Diagnosis not present

## 2016-06-04 DIAGNOSIS — F419 Anxiety disorder, unspecified: Secondary | ICD-10-CM | POA: Diagnosis not present

## 2016-06-04 DIAGNOSIS — K219 Gastro-esophageal reflux disease without esophagitis: Secondary | ICD-10-CM | POA: Diagnosis not present

## 2016-06-04 DIAGNOSIS — E1142 Type 2 diabetes mellitus with diabetic polyneuropathy: Secondary | ICD-10-CM | POA: Diagnosis not present

## 2016-06-04 DIAGNOSIS — Z299 Encounter for prophylactic measures, unspecified: Secondary | ICD-10-CM | POA: Diagnosis not present

## 2016-06-04 DIAGNOSIS — F3130 Bipolar disorder, current episode depressed, mild or moderate severity, unspecified: Secondary | ICD-10-CM | POA: Diagnosis not present

## 2016-06-04 DIAGNOSIS — M79604 Pain in right leg: Secondary | ICD-10-CM | POA: Diagnosis not present

## 2016-06-04 DIAGNOSIS — E1122 Type 2 diabetes mellitus with diabetic chronic kidney disease: Secondary | ICD-10-CM | POA: Diagnosis not present

## 2016-06-04 DIAGNOSIS — Z789 Other specified health status: Secondary | ICD-10-CM | POA: Diagnosis not present

## 2016-06-13 DIAGNOSIS — E7800 Pure hypercholesterolemia, unspecified: Secondary | ICD-10-CM | POA: Diagnosis not present

## 2016-06-13 DIAGNOSIS — E119 Type 2 diabetes mellitus without complications: Secondary | ICD-10-CM | POA: Diagnosis not present

## 2016-06-13 DIAGNOSIS — I10 Essential (primary) hypertension: Secondary | ICD-10-CM | POA: Diagnosis not present

## 2016-06-20 DIAGNOSIS — E1122 Type 2 diabetes mellitus with diabetic chronic kidney disease: Secondary | ICD-10-CM | POA: Diagnosis not present

## 2016-06-20 DIAGNOSIS — N183 Chronic kidney disease, stage 3 (moderate): Secondary | ICD-10-CM | POA: Diagnosis not present

## 2016-06-20 DIAGNOSIS — Z713 Dietary counseling and surveillance: Secondary | ICD-10-CM | POA: Diagnosis not present

## 2016-06-20 DIAGNOSIS — Z6834 Body mass index (BMI) 34.0-34.9, adult: Secondary | ICD-10-CM | POA: Diagnosis not present

## 2016-06-20 DIAGNOSIS — F3130 Bipolar disorder, current episode depressed, mild or moderate severity, unspecified: Secondary | ICD-10-CM | POA: Diagnosis not present

## 2016-06-20 DIAGNOSIS — Z299 Encounter for prophylactic measures, unspecified: Secondary | ICD-10-CM | POA: Diagnosis not present

## 2016-06-20 DIAGNOSIS — R110 Nausea: Secondary | ICD-10-CM | POA: Diagnosis not present

## 2016-06-20 DIAGNOSIS — I10 Essential (primary) hypertension: Secondary | ICD-10-CM | POA: Diagnosis not present

## 2016-07-11 DIAGNOSIS — I10 Essential (primary) hypertension: Secondary | ICD-10-CM | POA: Diagnosis not present

## 2016-07-11 DIAGNOSIS — E7800 Pure hypercholesterolemia, unspecified: Secondary | ICD-10-CM | POA: Diagnosis not present

## 2016-07-11 DIAGNOSIS — E119 Type 2 diabetes mellitus without complications: Secondary | ICD-10-CM | POA: Diagnosis not present

## 2016-07-16 DIAGNOSIS — E113293 Type 2 diabetes mellitus with mild nonproliferative diabetic retinopathy without macular edema, bilateral: Secondary | ICD-10-CM | POA: Diagnosis not present

## 2016-07-16 DIAGNOSIS — Z794 Long term (current) use of insulin: Secondary | ICD-10-CM | POA: Diagnosis not present

## 2016-07-16 DIAGNOSIS — Z7984 Long term (current) use of oral hypoglycemic drugs: Secondary | ICD-10-CM | POA: Diagnosis not present

## 2016-07-16 DIAGNOSIS — E1165 Type 2 diabetes mellitus with hyperglycemia: Secondary | ICD-10-CM | POA: Diagnosis not present

## 2016-07-22 DIAGNOSIS — E1122 Type 2 diabetes mellitus with diabetic chronic kidney disease: Secondary | ICD-10-CM | POA: Diagnosis not present

## 2016-07-22 DIAGNOSIS — N183 Chronic kidney disease, stage 3 (moderate): Secondary | ICD-10-CM | POA: Diagnosis not present

## 2016-07-22 DIAGNOSIS — Z299 Encounter for prophylactic measures, unspecified: Secondary | ICD-10-CM | POA: Diagnosis not present

## 2016-07-22 DIAGNOSIS — Z789 Other specified health status: Secondary | ICD-10-CM | POA: Diagnosis not present

## 2016-07-22 DIAGNOSIS — I10 Essential (primary) hypertension: Secondary | ICD-10-CM | POA: Diagnosis not present

## 2016-07-22 DIAGNOSIS — F3130 Bipolar disorder, current episode depressed, mild or moderate severity, unspecified: Secondary | ICD-10-CM | POA: Diagnosis not present

## 2016-07-22 DIAGNOSIS — Z6834 Body mass index (BMI) 34.0-34.9, adult: Secondary | ICD-10-CM | POA: Diagnosis not present

## 2016-08-28 DIAGNOSIS — F3130 Bipolar disorder, current episode depressed, mild or moderate severity, unspecified: Secondary | ICD-10-CM | POA: Diagnosis not present

## 2016-08-28 DIAGNOSIS — K219 Gastro-esophageal reflux disease without esophagitis: Secondary | ICD-10-CM | POA: Diagnosis not present

## 2016-08-28 DIAGNOSIS — Z299 Encounter for prophylactic measures, unspecified: Secondary | ICD-10-CM | POA: Diagnosis not present

## 2016-08-28 DIAGNOSIS — E1122 Type 2 diabetes mellitus with diabetic chronic kidney disease: Secondary | ICD-10-CM | POA: Diagnosis not present

## 2016-08-28 DIAGNOSIS — E7800 Pure hypercholesterolemia, unspecified: Secondary | ICD-10-CM | POA: Diagnosis not present

## 2016-08-28 DIAGNOSIS — E1165 Type 2 diabetes mellitus with hyperglycemia: Secondary | ICD-10-CM | POA: Diagnosis not present

## 2016-08-28 DIAGNOSIS — Z6835 Body mass index (BMI) 35.0-35.9, adult: Secondary | ICD-10-CM | POA: Diagnosis not present

## 2016-08-28 DIAGNOSIS — N183 Chronic kidney disease, stage 3 (moderate): Secondary | ICD-10-CM | POA: Diagnosis not present

## 2016-08-28 DIAGNOSIS — E1142 Type 2 diabetes mellitus with diabetic polyneuropathy: Secondary | ICD-10-CM | POA: Diagnosis not present

## 2016-08-28 DIAGNOSIS — F419 Anxiety disorder, unspecified: Secondary | ICD-10-CM | POA: Diagnosis not present

## 2016-08-28 DIAGNOSIS — I10 Essential (primary) hypertension: Secondary | ICD-10-CM | POA: Diagnosis not present

## 2016-08-28 DIAGNOSIS — F39 Unspecified mood [affective] disorder: Secondary | ICD-10-CM | POA: Diagnosis not present

## 2016-09-03 DIAGNOSIS — E7800 Pure hypercholesterolemia, unspecified: Secondary | ICD-10-CM | POA: Diagnosis not present

## 2016-09-03 DIAGNOSIS — I10 Essential (primary) hypertension: Secondary | ICD-10-CM | POA: Diagnosis not present

## 2016-09-03 DIAGNOSIS — E119 Type 2 diabetes mellitus without complications: Secondary | ICD-10-CM | POA: Diagnosis not present

## 2016-09-10 DIAGNOSIS — F319 Bipolar disorder, unspecified: Secondary | ICD-10-CM | POA: Diagnosis not present

## 2016-10-10 DIAGNOSIS — I10 Essential (primary) hypertension: Secondary | ICD-10-CM | POA: Diagnosis not present

## 2016-10-10 DIAGNOSIS — Z87891 Personal history of nicotine dependence: Secondary | ICD-10-CM | POA: Diagnosis not present

## 2016-10-10 DIAGNOSIS — K219 Gastro-esophageal reflux disease without esophagitis: Secondary | ICD-10-CM | POA: Diagnosis not present

## 2016-10-10 DIAGNOSIS — E7800 Pure hypercholesterolemia, unspecified: Secondary | ICD-10-CM | POA: Diagnosis not present

## 2016-10-10 DIAGNOSIS — Z7982 Long term (current) use of aspirin: Secondary | ICD-10-CM | POA: Diagnosis not present

## 2016-10-10 DIAGNOSIS — Z79899 Other long term (current) drug therapy: Secondary | ICD-10-CM | POA: Diagnosis not present

## 2016-10-10 DIAGNOSIS — E669 Obesity, unspecified: Secondary | ICD-10-CM | POA: Diagnosis not present

## 2016-10-10 DIAGNOSIS — Z794 Long term (current) use of insulin: Secondary | ICD-10-CM | POA: Diagnosis not present

## 2016-10-10 DIAGNOSIS — E119 Type 2 diabetes mellitus without complications: Secondary | ICD-10-CM | POA: Diagnosis not present

## 2016-10-13 DIAGNOSIS — R197 Diarrhea, unspecified: Secondary | ICD-10-CM | POA: Diagnosis not present

## 2016-10-13 DIAGNOSIS — Z7982 Long term (current) use of aspirin: Secondary | ICD-10-CM | POA: Diagnosis not present

## 2016-10-13 DIAGNOSIS — I10 Essential (primary) hypertension: Secondary | ICD-10-CM | POA: Diagnosis not present

## 2016-10-13 DIAGNOSIS — Z87891 Personal history of nicotine dependence: Secondary | ICD-10-CM | POA: Diagnosis not present

## 2016-10-13 DIAGNOSIS — E119 Type 2 diabetes mellitus without complications: Secondary | ICD-10-CM | POA: Diagnosis not present

## 2016-10-13 DIAGNOSIS — F419 Anxiety disorder, unspecified: Secondary | ICD-10-CM | POA: Diagnosis not present

## 2016-10-13 DIAGNOSIS — Z79899 Other long term (current) drug therapy: Secondary | ICD-10-CM | POA: Diagnosis not present

## 2016-10-13 DIAGNOSIS — F319 Bipolar disorder, unspecified: Secondary | ICD-10-CM | POA: Diagnosis not present

## 2016-10-13 DIAGNOSIS — K219 Gastro-esophageal reflux disease without esophagitis: Secondary | ICD-10-CM | POA: Diagnosis not present

## 2016-10-14 ENCOUNTER — Emergency Department
Admission: EM | Admit: 2016-10-14 | Discharge: 2016-10-15 | Disposition: A | Discharge status: Other Acute Inpatient Hospital | Payer: Medicare Other | Source: Home / Self Care | Attending: Emergency Medicine | Admitting: Emergency Medicine

## 2016-10-14 ENCOUNTER — Encounter: Payer: Self-pay | Admitting: Emergency Medicine

## 2016-10-14 DIAGNOSIS — I129 Hypertensive chronic kidney disease with stage 1 through stage 4 chronic kidney disease, or unspecified chronic kidney disease: Secondary | ICD-10-CM

## 2016-10-14 DIAGNOSIS — E785 Hyperlipidemia, unspecified: Secondary | ICD-10-CM | POA: Diagnosis present

## 2016-10-14 DIAGNOSIS — R32 Unspecified urinary incontinence: Secondary | ICD-10-CM | POA: Diagnosis present

## 2016-10-14 DIAGNOSIS — E039 Hypothyroidism, unspecified: Secondary | ICD-10-CM | POA: Insufficient documentation

## 2016-10-14 DIAGNOSIS — Z7982 Long term (current) use of aspirin: Secondary | ICD-10-CM | POA: Insufficient documentation

## 2016-10-14 DIAGNOSIS — F315 Bipolar disorder, current episode depressed, severe, with psychotic features: Secondary | ICD-10-CM | POA: Diagnosis present

## 2016-10-14 DIAGNOSIS — Z87891 Personal history of nicotine dependence: Secondary | ICD-10-CM

## 2016-10-14 DIAGNOSIS — I120 Hypertensive chronic kidney disease with stage 5 chronic kidney disease or end stage renal disease: Secondary | ICD-10-CM | POA: Diagnosis not present

## 2016-10-14 DIAGNOSIS — K219 Gastro-esophageal reflux disease without esophagitis: Secondary | ICD-10-CM | POA: Diagnosis present

## 2016-10-14 DIAGNOSIS — E119 Type 2 diabetes mellitus without complications: Secondary | ICD-10-CM

## 2016-10-14 DIAGNOSIS — N179 Acute kidney failure, unspecified: Secondary | ICD-10-CM | POA: Diagnosis not present

## 2016-10-14 DIAGNOSIS — R45851 Suicidal ideations: Secondary | ICD-10-CM | POA: Diagnosis not present

## 2016-10-14 DIAGNOSIS — E872 Acidosis: Secondary | ICD-10-CM | POA: Diagnosis not present

## 2016-10-14 DIAGNOSIS — Z794 Long term (current) use of insulin: Secondary | ICD-10-CM

## 2016-10-14 DIAGNOSIS — F329 Major depressive disorder, single episode, unspecified: Secondary | ICD-10-CM

## 2016-10-14 DIAGNOSIS — I10 Essential (primary) hypertension: Secondary | ICD-10-CM | POA: Diagnosis present

## 2016-10-14 DIAGNOSIS — E1122 Type 2 diabetes mellitus with diabetic chronic kidney disease: Secondary | ICD-10-CM

## 2016-10-14 DIAGNOSIS — N189 Chronic kidney disease, unspecified: Secondary | ICD-10-CM

## 2016-10-14 DIAGNOSIS — Z7984 Long term (current) use of oral hypoglycemic drugs: Secondary | ICD-10-CM

## 2016-10-14 DIAGNOSIS — E871 Hypo-osmolality and hyponatremia: Secondary | ICD-10-CM | POA: Diagnosis not present

## 2016-10-14 LAB — COMPREHENSIVE METABOLIC PANEL
ALBUMIN: 4.6 g/dL (ref 3.5–5.0)
ALK PHOS: 52 U/L (ref 38–126)
ALT: 24 U/L (ref 14–54)
ANION GAP: 10 (ref 5–15)
AST: 22 U/L (ref 15–41)
BILIRUBIN TOTAL: 0.6 mg/dL (ref 0.3–1.2)
BUN: 29 mg/dL — AB (ref 6–20)
CALCIUM: 10.6 mg/dL — AB (ref 8.9–10.3)
CO2: 23 mmol/L (ref 22–32)
Chloride: 103 mmol/L (ref 101–111)
Creatinine, Ser: 1.29 mg/dL — ABNORMAL HIGH (ref 0.44–1.00)
GFR calc Af Amer: 50 mL/min — ABNORMAL LOW (ref >60)
GFR, EST NON AFRICAN AMERICAN: 43 mL/min — AB (ref >60)
GLUCOSE: 197 mg/dL — AB (ref 65–99)
Potassium: 4.7 mmol/L (ref 3.5–5.1)
Sodium: 136 mmol/L (ref 135–145)
TOTAL PROTEIN: 8.7 g/dL — AB (ref 6.5–8.1)

## 2016-10-14 LAB — CBC
HEMATOCRIT: 41 % (ref 35.0–47.0)
Hemoglobin: 13.4 g/dL (ref 12.0–16.0)
MCH: 26.3 pg (ref 26.0–34.0)
MCHC: 32.8 g/dL (ref 32.0–36.0)
MCV: 80.3 fL (ref 80.0–100.0)
Platelets: 238 10*3/uL (ref 150–440)
RBC: 5.1 MIL/uL (ref 3.80–5.20)
RDW: 16.4 % — AB (ref 11.5–14.5)
WBC: 8.8 10*3/uL (ref 3.6–11.0)

## 2016-10-14 LAB — URINE DRUG SCREEN, QUALITATIVE (ARMC ONLY)
Amphetamines, Ur Screen: NOT DETECTED
BENZODIAZEPINE, UR SCRN: NOT DETECTED
Barbiturates, Ur Screen: NOT DETECTED
CANNABINOID 50 NG, UR ~~LOC~~: NOT DETECTED
Cocaine Metabolite,Ur ~~LOC~~: NOT DETECTED
MDMA (Ecstasy)Ur Screen: NOT DETECTED
Methadone Scn, Ur: NOT DETECTED
OPIATE, UR SCREEN: NOT DETECTED
PHENCYCLIDINE (PCP) UR S: NOT DETECTED
Tricyclic, Ur Screen: NOT DETECTED

## 2016-10-14 LAB — SALICYLATE LEVEL: Salicylate Lvl: <7 mg/dL (ref 2.8–30.0)

## 2016-10-14 LAB — ACETAMINOPHEN LEVEL: Acetaminophen (Tylenol), Serum: <10 ug/mL — ABNORMAL LOW (ref 10–30)

## 2016-10-14 LAB — ETHANOL: Alcohol, Ethyl (B): <5 mg/dL (ref <5)

## 2016-10-14 MED ORDER — PANTOPRAZOLE SODIUM 40 MG PO TBEC
40.0000 mg | DELAYED_RELEASE_TABLET | Freq: Every day | ORAL | Status: DC
  Administered 2016-10-15: 40 mg via ORAL
  Filled 2016-10-14: qty 1

## 2016-10-14 MED ORDER — PRAVASTATIN SODIUM 40 MG PO TABS
40.0000 mg | ORAL_TABLET | Freq: Every day | ORAL | Status: DC
  Administered 2016-10-14 – 2016-10-15 (×2): 40 mg via ORAL
  Filled 2016-10-14 (×3): qty 1

## 2016-10-14 MED ORDER — ASPIRIN EC 81 MG PO TBEC
81.0000 mg | DELAYED_RELEASE_TABLET | Freq: Every day | ORAL | Status: DC
Start: 2016-10-14 — End: 2016-10-15
  Administered 2016-10-15: 81 mg via ORAL
  Filled 2016-10-14: qty 1

## 2016-10-14 MED ORDER — METFORMIN HCL 500 MG PO TABS
1000.0000 mg | ORAL_TABLET | Freq: Two times a day (BID) | ORAL | Status: DC
  Administered 2016-10-14 – 2016-10-15 (×3): 1000 mg via ORAL
  Filled 2016-10-14 (×3): qty 2

## 2016-10-14 MED ORDER — LOSARTAN POTASSIUM 50 MG PO TABS
25.0000 mg | ORAL_TABLET | Freq: Every day | ORAL | Status: DC
Start: 2016-10-14 — End: 2016-10-15
  Administered 2016-10-15: 25 mg via ORAL
  Filled 2016-10-14: qty 1

## 2016-10-14 MED ORDER — METOPROLOL SUCCINATE ER 50 MG PO TB24
25.0000 mg | ORAL_TABLET | Freq: Every day | ORAL | Status: DC
  Administered 2016-10-15: 25 mg via ORAL
  Filled 2016-10-14: qty 1

## 2016-10-14 MED ORDER — FERROUS SULFATE 325 (65 FE) MG PO TABS
325.0000 mg | ORAL_TABLET | Freq: Every day | ORAL | Status: DC
  Filled 2016-10-14: qty 1

## 2016-10-14 NOTE — ED Triage Notes (Signed)
Pt admits to SI.  Denies HI. No plan per pt. Admits to hearing some voices last couple days and seeing shadows. Ambulatory to triage.  NAD. VSS

## 2016-10-14 NOTE — ED Notes (Signed)
Pt belongings bag 1 of 1 placed in [facility] locker room. Bag is labeled.

## 2016-10-14 NOTE — ED Notes (Signed)
BEHAVIORAL HEALTH ROUNDING Patient sleeping: No. Patient alert and oriented: yes Behavior appropriate: Yes.  ; If no, describe:  Nutrition and fluids offered: yes Toileting and hygiene offered: Yes  Sitter present: q15 minute observations and security  monitoring Law enforcement present: Yes  ODS  

## 2016-10-14 NOTE — ED Notes (Signed)

## 2016-10-14 NOTE — ED Notes (Signed)
ED BHU [place] Is the patient under IVC or is there intent for IVC: Yes.   Is the patient medically cleared: Yes.   Is there vacancy in the ED BHU: Yes.   Is the population mix appropriate for patient: Yes.   Is the patient awaiting placement in inpatient or outpatient setting: Yes.   Has the patient had a psychiatric consult: Yes.   Survey of unit performed for contraband, proper placement and condition of furniture, tampering with fixtures in bathroom, shower, and each patient room: Yes.  ; Findings: Environment secure. APPEARANCE/BEHAVIOR calm, cooperative and adequate rapport can be established NEURO ASSESSMENT Orientation: time, place and person Hallucinations: No.None noted (Hallucinations) Speech: Normal Gait: normal RESPIRATORY ASSESSMENT Normal expansion.  Clear to auscultation.  No rales, rhonchi, or wheezing. CARDIOVASCULAR ASSESSMENT regular rate and rhythm, S1, S2 normal, no murmur, click, rub or gallop GASTROINTESTINAL ASSESSMENT soft, nontender, BS WNL, no r/g EXTREMITIES normal strength, tone, and muscle mass, no deformities, no erythema, induration, or nodules, no evidence of joint effusion, ROM of all joints is normal, no evidence of joint instability PLAN OF CARE Provide calm/safe environment. Vital signs assessed twice daily. ED BHU Assessment once each 12-hour shift. Collaborate with intake RN daily or as condition indicates. Assure the ED provider has rounded once each shift. Provide and encourage hygiene. Provide redirection as needed. Assess for escalating behavior; address immediately and inform ED provider.  Assess family dynamic and appropriateness for visitation as needed: No.; If necessary, describe findings: Family is not present therefore family dynamics were not assessed.  Educate the patient/family about BHU procedures/visitation: Yes.  ; If necessary, describe findings: Pt states that she will follow the rules.

## 2016-10-14 NOTE — ED Notes (Signed)
Pt given supper tray.

## 2016-10-14 NOTE — ED Notes (Signed)
Pt ambulated to bathroom unassisted.

## 2016-10-14 NOTE — BH Assessment (Signed)
Assessment Note  Erika Jacobs is an 66 y.o. female presenting to the ED, voluntarily, with worsening symptoms of anxiety and depression.  Pt is also under the delusion that someone is trying to break into her home.  Pt reports she is unable to use her coping skills to alleviate her symptoms of anxiety.  Pt endorses suicidal ideations but has no intent or plan.  Pt denies drug/alchol use.  She currently denies SI/HI and any auditory/visual hallucinations.    Diagnosis: Anxiety  Past Medical History:  Past Medical History:  Diagnosis Date  . Anemia   . Anxiety   . Arthritis   . Bipolar 1 disorder ([place])   . Chronic constipation   . Chronic headache   . CKD (chronic kidney disease)   . Diabetes mellitus without complication ([place])   . Diabetic polyneuropathy ([place])   . Gastritis and duodenitis   . GERD (gastroesophageal reflux disease)   . Hypercholesteremia   . Hypertension   . Hypothyroid   . Overactive bladder   . Panic attack   . Paranoia ([place])   . Psychosis   . Shortness of breath   . Thought disorder     Past Surgical History:  Procedure Laterality Date  . CHOLECYSTECTOMY N/A 06/02/2013   Procedure: LAPAROSCOPIC CHOLECYSTECTOMY;  Surgeon: Scherry Ran, MD;  Location: [facility];  Service: General;  Laterality: N/A;  . COLONOSCOPY WITH ESOPHAGOGASTRODUODENOSCOPY (EGD) N/A 05/20/2013   Procedure: COLONOSCOPY WITH ESOPHAGOGASTRODUODENOSCOPY (EGD);  Surgeon: Rogene Houston, MD;  Location[facility];  Service: Endoscopy;  Laterality: N/A;  730  . EYE SURGERY Left   . TUBAL LIGATION      Family History: History reviewed. No pertinent family history.  Social History:  reports that she quit smoking about 14 years ago. Her smoking use included Cigarettes. She started smoking about 49 years ago. She has a 60.00 pack-year smoking history. She has never used smokeless tobacco. She reports that she does not drink alcohol or use drugs.  Additional Social History:  Alcohol /  Drug Use Pain Medications: See PTA Prescriptions: See PTA Over the Counter: See PTA History of alcohol / drug use?: No history of alcohol / drug abuse  CIWA: CIWA-Ar BP: (!) 116/58 Pulse Rate: 62 COWS:    Allergies:  Allergies  Allergen Reactions  . Aripiprazole Other (See Comments)    Pt shakes when taking this medication.  Lorayne Bender [Paliperidone]     High doses causes patient to shake  . Lisinopril Other (See Comments)    Kidney   . Risperidone And Related     hallucinations    Home Medications:  (Not in a hospital admission)  OB/GYN Status:  No LMP recorded (lmp unknown). Patient is postmenopausal.  General Assessment Data Location of Assessment: [facility] ED TTS Assessment: In system Is this a Tele or Face-to-Face Assessment?: Face-to-Face Is this an Initial Assessment or a Re-assessment for this encounter?: Initial Assessment Marital status: Single Maiden name: na Is patient pregnant?: No Pregnancy Status: No Living Arrangements: Alone Can pt return to current living arrangement?: Yes Admission Status: Voluntary Is patient capable of signing voluntary admission?: Yes Referral Source: Self/Family/Friend Insurance type: Medicare     Crisis Care Plan Living Arrangements: Alone Legal Guardian: Other: (self) Name of Psych[facility]rist: [facility] of Therapist: [facility]  Education Status Is patient currently in school?: No Current Grade: n/a Highest grade of school patient has completed: n/a Name of school: n/a Contact person: n/a  Risk to self with  the past 6 months Suicidal Ideation: Yes-Currently Present Has patient been a risk to self within the past 6 months prior to admission? : No Suicidal Intent: No Has patient had any suicidal intent within the past 6 months prior to admission? : No Is patient at risk for suicide?: No Suicidal Plan?: No Has patient had any suicidal plan within the past 6 months prior to admission? : No Access to Means: No What has  been your use of drugs/alcohol within the last 12 months?: Pt denies drug/alsohol use Previous Attempts/Gestures: No How many times?: 0 Other Self Harm Risks: None identified Triggers for Past Attempts: Unknown Intentional Self Injurious Behavior: None Family Suicide History: No Recent stressful life event(s): Recent negative physical changes Persecutory voices/beliefs?: No Depression: Yes Depression Symptoms: Loss of interest in usual pleasures, Feeling worthless/self pity, Tearfulness Substance abuse history and/or treatment for substance abuse?: No Suicide prevention information given to non-admitted patients: Not applicable  Risk to Others within the past 6 months Homicidal Ideation: No Does patient have any lifetime risk of violence toward others beyond the six months prior to admission? : No Thoughts of Harm to Others: No Current Homicidal Intent: No Current Homicidal Plan: No Access to Homicidal Means: No Identified Victim: None identified History of harm to others?: No Assessment of Violence: None Noted Violent Behavior Description: None identified Does patient have access to weapons?: No Criminal Charges Pending?: No Does patient have a court date: No Is patient on probation?: No  Psychosis Hallucinations: None noted Delusions: None noted  Mental Status Report Appearance/Hygiene: In scrubs Eye Contact: Fair Motor Activity: Freedom of movement Speech: Logical/coherent Level of Consciousness: Drowsy Mood: Anxious Affect: Anxious Anxiety Level: Moderate Thought Processes: Relevant Judgement: Unimpaired Orientation: Person, Place, Time, Situation Obsessive Compulsive Thoughts/Behaviors: None  Cognitive Functioning Concentration: Normal Memory: Recent Intact, Remote Intact IQ: Average Insight: Good Impulse Control: Good Appetite: Fair Sleep: No Change Vegetative Symptoms: None  ADLScreening [facility] Assessment Services) Patient's cognitive ability adequate  to safely complete daily activities?: Yes Patient able to express need for assistance with ADLs?: Yes Independently performs ADLs?: Yes (appropriate for developmental age)  Prior Inpatient Therapy Prior Inpatient Therapy: Yes Prior Therapy Dates: 2017 Prior Therapy Facilty/Provider(s): [facility] Reason for Treatment: bipolar disorder  Prior Outpatient Therapy Prior Outpatient Therapy: Yes Prior Therapy Dates: current Prior Therapy Facilty/Provider(s): [facility] Reason for Treatment: bipolar disorder Does patient have an ACCT team?: No Does patient have Intensive In-House Services?  : No Does patient have [facility] services? : No Does patient have P4CC services?: No  ADL Screening (condition at time of admission) Patient's cognitive ability adequate to safely complete daily activities?: Yes Patient able to express need for assistance with ADLs?: Yes Independently performs ADLs?: Yes (appropriate for developmental age)       Abuse/Neglect Assessment (Assessment to be complete while patient is alone) Physical Abuse: Denies Verbal Abuse: Denies Sexual Abuse: Denies Exploitation of patient/patient's resources: Denies Self-Neglect: Denies Values / Beliefs Cultural Requests During Hospitalization: None Spiritual Requests During Hospitalization: None Consults Spiritual Care Consult Needed: No Social Work Consult Needed: No Regulatory affairs officer (For Healthcare) Does Patient Have a Medical Advance Directive?: No    Additional Information 1:1 In Past 12 Months?: No CIRT Risk: No Elopement Risk: No Does patient have medical clearance?: Yes     Disposition:  Disposition Initial Assessment Completed for this Encounter: Yes Disposition of Patient: Inpatient treatment program Type of inpatient treatment program: Adult  On Site Evaluation by:   Reviewed with Physician:  Nzinga Ferran C Harlen Danford 10/14/2016 11:51 PM

## 2016-10-14 NOTE — ED Provider Notes (Signed)
[facility] Emergency Department Provider Note  Time seen: 3:24 PM  I have reviewed the triage vital signs and the nursing notes.   HISTORY  Chief Complaint Suicidal    HPI Erika Jacobs is a 66 y.o. female with multiple medical issues including bipolar, anemia, anxiety, CK D, diabetes, hypertension, hyperlipidemia, presents to the emergency department for depression and suicidal ideation. According to the patient for the past few days she has been having thoughts of killing herself. Denies any specific plan to do so. Denies any inciting event. Denies any homicidal ideation. Patient denies any medical concerns today besides a mild headache which she states is almost gone at this time.  Past Medical History:  Diagnosis Date  . Anemia   . Anxiety   . Arthritis   . Bipolar 1 disorder ([place])   . Chronic constipation   . Chronic headache   . CKD (chronic kidney disease)   . Diabetes mellitus without complication ([place])   . Diabetic polyneuropathy ([place])   . Gastritis and duodenitis   . GERD (gastroesophageal reflux disease)   . Hypercholesteremia   . Hypertension   . Hypothyroid   . Overactive bladder   . Panic attack   . Paranoia ([place])   . Psychosis   . Shortness of breath   . Thought disorder     Patient Active Problem List   Diagnosis Date Noted  . 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 Severe manic bipolar 1 disorder with psychotic behavior ([place]) 10/20/2014  . Dyslipidemia 10/18/2014  . GERD (gastroesophageal reflux disease) 10/18/2014  . Hypothyroidism 10/18/2014  . Diabetes ([place]) 10/17/2014  . Hypertension 10/17/2014  . Urinary incontinence 10/17/2014  . Suicidal ideation   . Cholecystitis with cholelithiasis 06/02/2013    Past Surgical History:  Procedure Laterality Date  . CHOLECYSTECTOMY N/A 06/02/2013   Procedure: LAPAROSCOPIC CHOLECYSTECTOMY;  Surgeon: Scherry Ran, MD;   Location: [facility];  Service: General;  Laterality: N/A;  . COLONOSCOPY WITH ESOPHAGOGASTRODUODENOSCOPY (EGD) N/A 05/20/2013   Procedure: COLONOSCOPY WITH ESOPHAGOGASTRODUODENOSCOPY (EGD);  Surgeon: Rogene Houston, MD;  Location: [facility];  Service: Endoscopy;  Laterality: N/A;  730  . EYE SURGERY Left   . TUBAL LIGATION      Prior to Admission medications   Medication Sig Start Date End Date Taking? Authorizing Provider  Amoxicillin (AMOXIL PO) Take by mouth. Started 04/24/16 - tid    [provider]  aspirin EC 81 MG tablet Take 81 mg by mouth daily.    [provider]  benztropine (COGENTIN) 1 MG tablet Take 1 tablet by mouth 2 (two) times daily. 01/09/16   [provider]  Calcium Carbonate-Vit D-Min (CALCIUM 1200 PO) Take 1 tablet by mouth daily.    [provider]  Cholecalciferol (VITAMIN D3) 5000 units TABS Take 5,000 Units by mouth daily.    [provider]  Cinnamon 500 MG capsule Take 500 mg by mouth daily.    [provider]  Cyanocobalamin (VITAMIN B 12 PO) Take 1 tablet by mouth daily.    [provider]  docusate sodium (COLACE) 100 MG capsule Take 100 mg by mouth daily.    [provider]  ferrous sulfate 325 (65 FE) MG tablet Take 325 mg by mouth daily with breakfast.    [provider]  Garlic 6295 MG CAPS Take 1 capsule by mouth daily.    [provider]  losartan (COZAAR) 25 MG  tablet Take 1 tablet by mouth daily. 05/25/15   [provider]  metFORMIN (GLUCOPHAGE) 1000 MG tablet Take 1 tablet by mouth 2 (two) times daily. 05/24/15   [provider]  metoprolol succinate (TOPROL-XL) 25 MG 24 hr tablet Take 1 tablet by mouth daily. 12/31/15   [provider]  Multiple Vitamin (MULTIVITAMIN) tablet Take 1 tablet by mouth daily.    [provider]  Omega-3 Fatty Acids (FISH OIL) 1200 MG CAPS Take 1,200 mg by mouth daily.    [provider]   omeprazole (PRILOSEC) 20 MG capsule Take 20 mg by mouth daily. 09/21/14   [provider]  oxybutynin (DITROPAN) 5 MG tablet Take 5 mg by mouth daily.    [provider]  paliperidone (INVEGA) 6 MG 24 hr tablet Take 1 tablet by mouth daily. 01/09/16   [provider]  pravastatin (PRAVACHOL) 40 MG tablet Take 40 mg by mouth daily.    [provider]  TRESIBA FLEXTOUCH 200 UNIT/ML SOPN Inject 34 Units into the skin daily.  05/25/15   [provider]    Allergies  Allergen Reactions  . Aripiprazole Other (See Comments)    Pt shakes when taking this medication.  Lorayne Bender [Paliperidone]     High doses causes patient to shake  . Lisinopril Other (See Comments)    Kidney   . Risperidone And Related     hallucinations    History reviewed. No pertinent family history.  Social History Social History  Substance Use Topics  . Smoking status: Former Smoker    Packs/day: 2.00    Years: 30.00    Types: Cigarettes    Start date: 10/18/1967    Quit date: 05/20/2002  . Smokeless tobacco: Never Used  . Alcohol use No    Review of Systems Constitutional: Negative for fever. Eyes: Negative for visual changes. ENT: Negative for congestion Cardiovascular: Negative for chest pain. Respiratory: Negative for shortness of breath. Gastrointestinal: Negative for abdominal pain Genitourinary: Negative for dysuria. Musculoskeletal: Negative for back pain. Skin: Negative for rash. Neurological: Mild headache, nearly resolved at this time. All other ROS negative  ____________________________________________   PHYSICAL EXAM:  VITAL SIGNS: ED Triage Vitals  Enc Vitals Group     BP 10/14/16 1445 (!) 160/92     Pulse Rate 10/14/16 1445 72     Resp 10/14/16 1445 16     Temp 10/14/16 1445 98.5 F (36.9 C)     Temp Source 10/14/16 1445 Oral     SpO2 --      Weight 10/14/16 1447 193 lb (87.5 kg)     Height 10/14/16 1447 5' (1.524 m)     Head  Circumference --      Peak Flow --      Pain Score --      Pain Loc --      Pain Edu? --      Excl. in [place]? --     Constitutional: Alert and oriented. Well appearing and in no distress. Eyes: Normal exam ENT   Head: Normocephalic and atraumatic.   Mouth/Throat: Mucous membranes are moist. Cardiovascular: Normal rate, regular rhythm. No murmur Respiratory: Normal respiratory effort without tachypnea nor retractions. Breath sounds are clear Gastrointestinal: Soft and nontender. No distention.   Musculoskeletal: Nontender with normal range of motion in all extremities.  Neurologic:  Normal speech and language. No gross focal neurologic deficits Skin:  Skin is warm, dry and intact.  Psychiatric: States depression with suicidal  ideation  ____________________________________________   INITIAL IMPRESSION / ASSESSMENT AND PLAN / ED COURSE  Pertinent labs & imaging results that were available during my care of the patient were reviewed by me and considered in my medical decision making (see chart for details).  The patient presents to the emergency department for depression with suicidal ideation. Denies any inciting event. Denies any specific plan. Denies any homicidal ideation. We will check labs, have psychiatry and TTS see the patient. We'll place her under an involuntary commitment order as she is having active thoughts of killing himself. No medical complaints at this time.  Medical workup largely at baseline, nonrevealing. Patient has been seen by psychiatry they recommend inpatient admission. ____________________________________________   FINAL CLINICAL IMPRESSION(S) / ED DIAGNOSES  Suicidal ideation    Harvest Dark, MD 10/14/16 [phone]

## 2016-10-14 NOTE — ED Notes (Signed)
Patient assigned to appropriate care area. Patient oriented to unit/care area: Informed that, for their safety, care areas are designed for safety and monitored by security cameras at all times; and visiting hours explained to patient. Patient verbalizes understanding, and verbal contract for safety obtained. 

## 2016-10-14 NOTE — ED Notes (Signed)
Pt given ice water by request

## 2016-10-15 ENCOUNTER — Encounter: Payer: Self-pay | Admitting: Psychiatry

## 2016-10-15 ENCOUNTER — Inpatient Hospital Stay
Admission: AD | Admit: 2016-10-15 | Discharge: 2016-10-17 | DRG: 885 | Disposition: A | Discharge status: Other Acute Inpatient Hospital | Payer: Medicare Other | Source: Intra-hospital | Attending: Psychiatry | Admitting: Psychiatry

## 2016-10-15 DIAGNOSIS — E785 Hyperlipidemia, unspecified: Secondary | ICD-10-CM | POA: Diagnosis present

## 2016-10-15 DIAGNOSIS — E860 Dehydration: Secondary | ICD-10-CM | POA: Diagnosis present

## 2016-10-15 DIAGNOSIS — K219 Gastro-esophageal reflux disease without esophagitis: Secondary | ICD-10-CM | POA: Diagnosis present

## 2016-10-15 DIAGNOSIS — K5909 Other constipation: Secondary | ICD-10-CM | POA: Diagnosis present

## 2016-10-15 DIAGNOSIS — Z87891 Personal history of nicotine dependence: Secondary | ICD-10-CM

## 2016-10-15 DIAGNOSIS — E1142 Type 2 diabetes mellitus with diabetic polyneuropathy: Secondary | ICD-10-CM | POA: Diagnosis present

## 2016-10-15 DIAGNOSIS — E119 Type 2 diabetes mellitus without complications: Secondary | ICD-10-CM | POA: Diagnosis not present

## 2016-10-15 DIAGNOSIS — I120 Hypertensive chronic kidney disease with stage 5 chronic kidney disease or end stage renal disease: Secondary | ICD-10-CM | POA: Diagnosis not present

## 2016-10-15 DIAGNOSIS — N183 Chronic kidney disease, stage 3 unspecified: Secondary | ICD-10-CM | POA: Diagnosis present

## 2016-10-15 DIAGNOSIS — I129 Hypertensive chronic kidney disease with stage 1 through stage 4 chronic kidney disease, or unspecified chronic kidney disease: Secondary | ICD-10-CM | POA: Diagnosis present

## 2016-10-15 DIAGNOSIS — Z794 Long term (current) use of insulin: Secondary | ICD-10-CM

## 2016-10-15 DIAGNOSIS — R51 Headache: Secondary | ICD-10-CM | POA: Diagnosis present

## 2016-10-15 DIAGNOSIS — Z888 Allergy status to other drugs, medicaments and biological substances status: Secondary | ICD-10-CM

## 2016-10-15 DIAGNOSIS — R32 Unspecified urinary incontinence: Secondary | ICD-10-CM | POA: Diagnosis present

## 2016-10-15 DIAGNOSIS — Z7982 Long term (current) use of aspirin: Secondary | ICD-10-CM

## 2016-10-15 DIAGNOSIS — G4700 Insomnia, unspecified: Secondary | ICD-10-CM | POA: Diagnosis present

## 2016-10-15 DIAGNOSIS — Z818 Family history of other mental and behavioral disorders: Secondary | ICD-10-CM

## 2016-10-15 DIAGNOSIS — E8881 Metabolic syndrome: Secondary | ICD-10-CM | POA: Diagnosis present

## 2016-10-15 DIAGNOSIS — R45851 Suicidal ideations: Secondary | ICD-10-CM | POA: Diagnosis present

## 2016-10-15 DIAGNOSIS — E039 Hypothyroidism, unspecified: Secondary | ICD-10-CM | POA: Diagnosis present

## 2016-10-15 DIAGNOSIS — F410 Panic disorder [episodic paroxysmal anxiety] without agoraphobia: Secondary | ICD-10-CM | POA: Diagnosis present

## 2016-10-15 DIAGNOSIS — F315 Bipolar disorder, current episode depressed, severe, with psychotic features: Secondary | ICD-10-CM | POA: Diagnosis not present

## 2016-10-15 DIAGNOSIS — N390 Urinary tract infection, site not specified: Secondary | ICD-10-CM | POA: Diagnosis not present

## 2016-10-15 DIAGNOSIS — I10 Essential (primary) hypertension: Secondary | ICD-10-CM | POA: Diagnosis not present

## 2016-10-15 DIAGNOSIS — E871 Hypo-osmolality and hyponatremia: Secondary | ICD-10-CM | POA: Diagnosis not present

## 2016-10-15 DIAGNOSIS — D649 Anemia, unspecified: Secondary | ICD-10-CM | POA: Diagnosis present

## 2016-10-15 DIAGNOSIS — N179 Acute kidney failure, unspecified: Secondary | ICD-10-CM | POA: Diagnosis not present

## 2016-10-15 DIAGNOSIS — E1122 Type 2 diabetes mellitus with diabetic chronic kidney disease: Secondary | ICD-10-CM | POA: Diagnosis present

## 2016-10-15 DIAGNOSIS — E162 Hypoglycemia, unspecified: Secondary | ICD-10-CM | POA: Diagnosis not present

## 2016-10-15 DIAGNOSIS — E872 Acidosis: Secondary | ICD-10-CM | POA: Diagnosis not present

## 2016-10-15 LAB — GLUCOSE, CAPILLARY
GLUCOSE-CAPILLARY: 147 mg/dL — AB (ref 65–99)
Glucose-Capillary: 167 mg/dL — ABNORMAL HIGH (ref 65–99)

## 2016-10-15 MED ORDER — LOSARTAN POTASSIUM 25 MG PO TABS
25.0000 mg | ORAL_TABLET | Freq: Every day | ORAL | Status: DC
  Administered 2016-10-17: 25 mg via ORAL
  Filled 2016-10-15 (×2): qty 1

## 2016-10-15 MED ORDER — TRAZODONE HCL 100 MG PO TABS
100.0000 mg | ORAL_TABLET | Freq: Every day | ORAL | Status: DC
  Administered 2016-10-16: 100 mg via ORAL
  Filled 2016-10-15: qty 1

## 2016-10-15 MED ORDER — MAGNESIUM HYDROXIDE 400 MG/5ML PO SUSP: 30.0000 mL | Freq: Every day | ORAL | Status: DC | PRN

## 2016-10-15 MED ORDER — PALIPERIDONE ER 3 MG PO TB24
6.0000 mg | ORAL_TABLET | Freq: Every day | ORAL | Status: DC
  Administered 2016-10-16 – 2016-10-17 (×2): 6 mg via ORAL
  Filled 2016-10-15 (×2): qty 2

## 2016-10-15 MED ORDER — OMEGA-3-ACID ETHYL ESTERS 1 G PO CAPS
2.0000 g | ORAL_CAPSULE | Freq: Every day | ORAL | Status: DC
  Administered 2016-10-16 – 2016-10-17 (×2): 2 g via ORAL
  Filled 2016-10-15 (×3): qty 2

## 2016-10-15 MED ORDER — OXYBUTYNIN CHLORIDE 5 MG PO TABS
5.0000 mg | ORAL_TABLET | Freq: Every day | ORAL | Status: DC
  Filled 2016-10-15: qty 1

## 2016-10-15 MED ORDER — FERROUS SULFATE 325 (65 FE) MG PO TABS
325.0000 mg | ORAL_TABLET | Freq: Every day | ORAL | Status: DC
  Filled 2016-10-15: qty 1

## 2016-10-15 MED ORDER — INSULIN ASPART 100 UNIT/ML ~~LOC~~ SOLN
0.0000 [IU] | Freq: Three times a day (TID) | SUBCUTANEOUS | Status: DC
  Administered 2016-10-16: 2 [IU] via SUBCUTANEOUS

## 2016-10-15 MED ORDER — OXYBUTYNIN CHLORIDE 5 MG PO TABS
5.0000 mg | ORAL_TABLET | Freq: Every day | ORAL | Status: DC
  Filled 2016-10-15 (×3): qty 1

## 2016-10-15 MED ORDER — DOCUSATE SODIUM 100 MG PO CAPS
100.0000 mg | ORAL_CAPSULE | Freq: Every day | ORAL | Status: DC
  Filled 2016-10-15: qty 1

## 2016-10-15 MED ORDER — PRAVASTATIN SODIUM 40 MG PO TABS
40.0000 mg | ORAL_TABLET | Freq: Every day | ORAL | Status: DC
  Administered 2016-10-16 – 2016-10-17 (×2): 40 mg via ORAL
  Filled 2016-10-15 (×2): qty 1

## 2016-10-15 MED ORDER — INSULIN ASPART 100 UNIT/ML ~~LOC~~ SOLN: 0.0000 [IU] | Freq: Every day | SUBCUTANEOUS | Status: DC

## 2016-10-15 MED ORDER — ACETAMINOPHEN 325 MG PO TABS: 650.0000 mg | ORAL_TABLET | Freq: Four times a day (QID) | ORAL | Status: DC | PRN

## 2016-10-15 MED ORDER — ASPIRIN EC 81 MG PO TBEC
81.0000 mg | DELAYED_RELEASE_TABLET | Freq: Every day | ORAL | Status: DC
  Administered 2016-10-16 – 2016-10-17 (×2): 81 mg via ORAL
  Filled 2016-10-15 (×3): qty 1

## 2016-10-15 MED ORDER — METFORMIN HCL 500 MG PO TABS
1000.0000 mg | ORAL_TABLET | Freq: Two times a day (BID) | ORAL | Status: DC
  Administered 2016-10-16: 1000 mg via ORAL
  Filled 2016-10-15: qty 2

## 2016-10-15 MED ORDER — METOPROLOL SUCCINATE ER 25 MG PO TB24
25.0000 mg | ORAL_TABLET | Freq: Every day | ORAL | Status: DC
  Filled 2016-10-15: qty 1

## 2016-10-15 MED ORDER — ACETAMINOPHEN 500 MG PO TABS
1000.0000 mg | ORAL_TABLET | Freq: Once | ORAL | Status: AC
  Administered 2016-10-15: 1000 mg via ORAL
  Filled 2016-10-15: qty 2

## 2016-10-15 MED ORDER — ALUM & MAG HYDROXIDE-SIMETH 200-200-20 MG/5ML PO SUSP: 30.0000 mL | ORAL | Status: DC | PRN

## 2016-10-15 MED ORDER — PANTOPRAZOLE SODIUM 40 MG PO TBEC
40.0000 mg | DELAYED_RELEASE_TABLET | Freq: Every day | ORAL | Status: DC
  Filled 2016-10-15: qty 1

## 2016-10-15 MED ORDER — VITAMIN D 1000 UNITS PO TABS
5000.0000 [IU] | ORAL_TABLET | Freq: Every day | ORAL | Status: DC
  Administered 2016-10-16 – 2016-10-17 (×2): 5000 [IU] via ORAL
  Filled 2016-10-15 (×2): qty 5

## 2016-10-15 NOTE — ED Notes (Signed)
Patient in bathroom at this time.

## 2016-10-15 NOTE — ED Notes (Signed)
Patient in bathroom

## 2016-10-15 NOTE — ED Notes (Signed)
Patient in room watching tv

## 2016-10-15 NOTE — ED Notes (Signed)
Pharmacy notified to send Ditropan dose.

## 2016-10-15 NOTE — ED Notes (Signed)
Patient on phone at this time

## 2016-10-15 NOTE — ED Notes (Signed)
Pt provided lunch tray.

## 2016-10-15 NOTE — BH Assessment (Signed)
Pt chart under review by Dr.Pucilowska for possible [facility] BMU admission.

## 2016-10-15 NOTE — ED Provider Notes (Signed)
-----------------------------------------   6:05 AM on 10/15/2016 -----------------------------------------   Blood pressure (!) 116/58, pulse 62, temperature 98.5 F (36.9 C), temperature source Oral, resp. rate 16, height 5' (1.524 m), weight 87.5 kg (193 lb), SpO2 97 %.  The patient had no acute events since last update.  Calm and cooperative at this time.  Disposition is pending Psychiatry/Behavioral Medicine team recommendations.     Paulette Blanch, MD 10/15/16 [phone]

## 2016-10-15 NOTE — ED Notes (Signed)
Pt transferred to lower level BHU. This ED Tech obtained the patient's belongings 1/1 bag from the [employer].

## 2016-10-15 NOTE — Progress Notes (Signed)
Patient ID: Erika Jacobs, female   DOB: 02/25/1951, 66 y.o.   MRN: [id number] Per State regulations 482.30 this chart was reviewed for medical necessity with respect to the patient's admission/duration of stay.    Next review date: 10/17/16  Debarah Crape, BSN, RN-BC  Case Manager

## 2016-10-15 NOTE — BH Assessment (Signed)
Patient has been accepted to [facility] by Dr. Bary Leriche. Bed Assignment:  301 Attending MD:  Dr. Bary Leriche

## 2016-10-15 NOTE — ED Notes (Signed)
Pt provided phone upon request.

## 2016-10-15 NOTE — BH Assessment (Signed)
Pt placement referral submitted to [facility] and Teacher, music.

## 2016-10-16 DIAGNOSIS — F315 Bipolar disorder, current episode depressed, severe, with psychotic features: Principal | ICD-10-CM

## 2016-10-16 LAB — BASIC METABOLIC PANEL
Anion gap: 16 — ABNORMAL HIGH (ref 5–15)
BUN: 73 mg/dL — ABNORMAL HIGH (ref 6–20)
CALCIUM: 8.9 mg/dL (ref 8.9–10.3)
CO2: 15 mmol/L — ABNORMAL LOW (ref 22–32)
Chloride: 96 mmol/L — ABNORMAL LOW (ref 101–111)
Creatinine, Ser: 5.78 mg/dL — ABNORMAL HIGH (ref 0.44–1.00)
GFR, EST AFRICAN AMERICAN: 8 mL/min — AB (ref >60)
GFR, EST NON AFRICAN AMERICAN: 7 mL/min — AB (ref >60)
Glucose, Bld: 114 mg/dL — ABNORMAL HIGH (ref 65–99)
Potassium: 5 mmol/L (ref 3.5–5.1)
SODIUM: 127 mmol/L — AB (ref 135–145)

## 2016-10-16 LAB — GLUCOSE, CAPILLARY
GLUCOSE-CAPILLARY: 120 mg/dL — AB (ref 65–99)
GLUCOSE-CAPILLARY: 98 mg/dL (ref 65–99)
Glucose-Capillary: 160 mg/dL — ABNORMAL HIGH (ref 65–99)
Glucose-Capillary: 102 mg/dL — ABNORMAL HIGH (ref 65–99)

## 2016-10-16 LAB — LIPID PANEL
CHOL/HDL RATIO: 1.6 ratio
Cholesterol: 89 mg/dL (ref 0–200)
HDL: 57 mg/dL (ref >40)
LDL CALC: 16 mg/dL (ref 0–99)
TRIGLYCERIDES: 79 mg/dL (ref <150)
VLDL: 16 mg/dL (ref 0–40)

## 2016-10-16 LAB — CBC
HCT: 38.3 % (ref 35.0–47.0)
Hemoglobin: 12.3 g/dL (ref 12.0–16.0)
MCH: 26.4 pg (ref 26.0–34.0)
MCHC: 32.1 g/dL (ref 32.0–36.0)
MCV: 82.3 fL (ref 80.0–100.0)
PLATELETS: 183 10*3/uL (ref 150–440)
RBC: 4.64 MIL/uL (ref 3.80–5.20)
RDW: 16.1 % — ABNORMAL HIGH (ref 11.5–14.5)
WBC: 11.1 10*3/uL — AB (ref 3.6–11.0)

## 2016-10-16 LAB — VITAMIN B12: Vitamin B-12: 2923 pg/mL — ABNORMAL HIGH (ref 180–914)

## 2016-10-16 LAB — TSH: TSH: 2.334 u[IU]/mL (ref 0.350–4.500)

## 2016-10-16 MED ORDER — VENLAFAXINE HCL ER 37.5 MG PO CP24
37.5000 mg | ORAL_CAPSULE | Freq: Every day | ORAL | Status: DC
  Administered 2016-10-16 – 2016-10-17 (×2): 37.5 mg via ORAL
  Filled 2016-10-16 (×2): qty 1

## 2016-10-16 MED ORDER — CEFUROXIME AXETIL 250 MG PO TABS
250.0000 mg | ORAL_TABLET | Freq: Two times a day (BID) | ORAL | Status: DC
  Administered 2016-10-16 – 2016-10-17 (×2): 250 mg via ORAL
  Filled 2016-10-16 (×3): qty 1

## 2016-10-16 MED ORDER — GLIPIZIDE 5 MG PO TABS
5.0000 mg | ORAL_TABLET | Freq: Two times a day (BID) | ORAL | Status: DC
  Administered 2016-10-16: 5 mg via ORAL
  Filled 2016-10-16 (×2): qty 1

## 2016-10-16 MED ORDER — METOPROLOL SUCCINATE ER 25 MG PO TB24
12.5000 mg | ORAL_TABLET | Freq: Every day | ORAL | Status: DC
  Administered 2016-10-17: 12.5 mg via ORAL
  Filled 2016-10-16: qty 1

## 2016-10-16 MED ORDER — ZOLPIDEM TARTRATE 5 MG PO TABS
5.0000 mg | ORAL_TABLET | Freq: Every day | ORAL | Status: DC
  Administered 2016-10-16: 5 mg via ORAL
  Filled 2016-10-16: qty 1

## 2016-10-16 NOTE — Plan of Care (Signed)
Problem: Nutrition: Goal: Adequate nutrition will be maintained Outcome: Progressing Ate about 50%
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
## 2016-10-16 NOTE — Progress Notes (Signed)
Recreation Therapy Notes  Date: 06.28.18 Time: 9:30 am Location: Craft Room  Group Topic: Leisure Education  Goal Area(s) Addresses:  Patient will identify things they are grateful for. Patient will identify how being grateful can influence decision making.  Behavioral Response: Did not attend  Intervention: Grateful Wheel  Activity: Patients were given an I Am Grateful For worksheet and were instructed to write things they are grateful for under each category.  Education: LRT educated patients on leisure.   Education Outcome: Patient did not attend group.   Clinical Observations/Feedback: Patient did not attend group.  Leonette Monarch, LRT/CTRS 10/16/2016 10:22 AM

## 2016-10-16 NOTE — Plan of Care (Signed)
Problem: Physical Regulation: Goal: Will remain free from infection Outcome: Progressing Infection prevention education provided. No sign of infection noted

## 2016-10-16 NOTE — Progress Notes (Signed)
Pt calm, cooperative and pleasant.  Visible on unit, interacting appropriately with staff and peers.  Compliant with some scheduled meds, refused several.  Pt concerned about her decreased urination since yesterday, one low blood pressure reading this morning (see flowsheets) and is concerned that her kidneys are failing.  Encouraged her to discuss these concerns with MD.  AM CBG 160. Pt presents as mildly confused, repeating questions several times in same conversation.   Pt reports two episodes of diarrhea today, unwitnessed.  MHT and patient report pt vomited after dinner.  Pt continues to voice concerns about urinary retention.  On call MD Dr Einar Grad notified, who ordered hospitalist consult.  Dr. Anselm Jungling reported to assess patient.  New orders received.  Dr. Anselm Jungling requests RN notify on call hospitalist be notified when results are back.  Pt in no acute distress, observed talking on phone, walking short to nurses station and back.
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
## 2016-10-16 NOTE — [facility] Group Notes (Signed)
[place] LCSW Group Therapy Note  Type of Therapy and Topic:  Group Therapy:  Goals Group: SMART Goals  Participation Level:  Patient did not attend group. CSW invited patient to group.   Description of Group:   The purpose of a daily goals group is to assist and guide patients in setting recovery/wellness-related goals.  The objective is to set goals as they relate to the crisis in which they were admitted. Patients will be using SMART goal modalities to set measurable goals.  Characteristics of realistic goals will be discussed and patients will be assisted in setting and processing how one will reach their goal. Facilitator will also assist patients in applying interventions and coping skills learned in psycho-education groups to the SMART goal and process how one will achieve defined goal.  Therapeutic Goals: -Patients will develop and document one goal related to or their crisis in which brought them into treatment. -Patients will be guided by LCSW using SMART goal setting modality in how to set a measurable, attainable, realistic and time sensitive goal.  -Patients will process barriers in reaching goal. -Patients will process interventions in how to overcome and successful in reaching goal.   Summary of Patient Progress:  Patient Goal: None identified at this time, patient did not attend group.   Therapeutic Modalities:   Motivational Interviewing Public relations account executive Therapy Crisis Intervention Model SMART goals setting  Yanet Balliet G. Archbald, [facility] 10/16/2016 10:16 AM
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
## 2016-10-16 NOTE — Plan of Care (Signed)
Problem: Safety: Goal: Ability to remain free from injury will improve Patient will remain free from injury while hospitalized Outcome: Progressing Patient had no injury. Safety precautions maintained

## 2016-10-16 NOTE — Plan of Care (Signed)
Problem: Coping: Goal: Ability to verbalize frustrations and anger appropriately will improve Outcome: Progressing Verbalized concern over select scheduled medications and discussed this with MD

## 2016-10-16 NOTE — Progress Notes (Signed)
   10/16/16 1420  Clinical Encounter Type  Visited With Patient not available  Visit Type Follow-up  Referral From Nurse    [facility] responded to consult request to speak with pt. Pt was not available to talk. [place] will try again later.

## 2016-10-16 NOTE — Consult Note (Signed)
[facility] [place] NAME: Erika Jacobs    MR#:  [id number]  DATE OF BIRTH:  1950-05-26  DATE OF ADMISSION:  10/15/2016  PRIMARY CARE PHYSICIAN: Monico Blitz, MD   REQUESTING/REFERRING PHYSICIAN: puchilowska  CHIEF COMPLAINT:  Urinary problems.  HISTORY OF PRESENT ILLNESS: Erika Jacobs  is a 66 y.o. female with a known history of hypertension, diabetes, chronic kidney disease stage III, bipolar disorder, anxiety, psychosis- admitted to behavioral medical unit for psychiatric issues. Since yesterday patient has some complaint of nausea and urinary symptoms. She said that she is urinating less and less and today she went to the bathroom 3 times but only some little amount of urine came out and she had some heaviness in her the lower abdominal area. She also had some chills and feeling cold. On further questioning she confirms that she is trying to drink liquids but likely not enough. Concerned with this psychiatric nurses did do a bladder scan and as per them it was reported 41 mL urine in the bladder. The patient did not had good urine output since today morning.  PAST MEDICAL HISTORY:   Past Medical History:  Diagnosis Date  . Anemia   . Anxiety   . Arthritis   . Bipolar 1 disorder ([place])   . Chronic constipation   . Chronic headache   . CKD (chronic kidney disease)   . Diabetes mellitus without complication ([place])   . Diabetic polyneuropathy ([place])   . Gastritis and duodenitis   . GERD (gastroesophageal reflux disease)   . Hypercholesteremia   . Hypertension   . Hypothyroid   . Overactive bladder   . Panic attack   . Paranoia ([place])   . Psychosis   . Shortness of breath   . Thought disorder     PAST SURGICAL HISTORY: Past Surgical History:  Procedure Laterality Date  . CHOLECYSTECTOMY N/A 06/02/2013   Procedure: LAPAROSCOPIC CHOLECYSTECTOMY;  Surgeon: Scherry Ran, MD;  Location: [facility] ORS;  Service: General;  Laterality:  N/A;  . COLONOSCOPY WITH ESOPHAGOGASTRODUODENOSCOPY (EGD) N/A 05/20/2013   Procedure: COLONOSCOPY WITH ESOPHAGOGASTRODUODENOSCOPY (EGD);  Surgeon: Rogene Houston, MD;  Location: [facility] ENDO SUITE;  Service: Endoscopy;  Laterality: N/A;  730  . EYE SURGERY Left   . TUBAL LIGATION      SOCIAL HISTORY:  Social History  Substance Use Topics  . Smoking status: Former Smoker    Packs/day: 2.00    Years: 30.00    Types: Cigarettes    Start date: 10/18/1967    Quit date: 05/20/2002  . Smokeless tobacco: Never Used  . Alcohol use No    FAMILY HISTORY: History reviewed. No pertinent family history.  DRUG ALLERGIES:  Allergies  Allergen Reactions  . Aripiprazole Other (See Comments)    Pt shakes when taking this medication.  Lorayne Bender [Paliperidone]     High doses causes patient to shake  . Lisinopril Other (See Comments)    Kidney   . Risperidone And Related     hallucinations    REVIEW OF SYSTEMS:   CONSTITUTIONAL: No fever, fatigue or weakness.  EYES: No blurred or double vision.  EARS, NOSE, AND THROAT: No tinnitus or ear pain.  RESPIRATORY: No cough, shortness of breath, wheezing or hemoptysis.  CARDIOVASCULAR: No chest pain, orthopnea, edema.  GASTROINTESTINAL: positive for nausea, no vomiting, diarrhea or abdominal pain.  GENITOURINARY: No dysuria, hematuria. Have decreased urine output and pressure on bladder area. ENDOCRINE: No polyuria, nocturia,  HEMATOLOGY: No anemia, easy bruising or bleeding SKIN: No rash or lesion. MUSCULOSKELETAL: No joint pain or arthritis.   NEUROLOGIC: No tingling, numbness, weakness.  PSYCHIATRY: No anxiety or depression.   MEDICATIONS AT HOME:  Prior to Admission medications   Medication Sig Start Date End Date Taking? Authorizing Provider  amLODipine (NORVASC) 2.5 MG tablet Take 2.5 mg by mouth daily. 09/22/16   [provider]  aspirin EC 81 MG tablet Take 81 mg by mouth daily.    [provider]  benztropine (COGENTIN) 1  MG tablet Take 1 tablet by mouth 2 (two) times daily. 01/09/16   [provider]  Calcium Carbonate-Vit D-Min (CALCIUM 1200 PO) Take 1 tablet by mouth daily.    [provider]  Cholecalciferol (VITAMIN D3) 5000 units TABS Take 5,000 Units by mouth daily.    [provider]  Cinnamon 500 MG capsule Take 500 mg by mouth daily.    [provider]  Cyanocobalamin (VITAMIN B 12 PO) Take 1 tablet by mouth daily.    [provider]  docusate sodium (COLACE) 100 MG capsule Take 100 mg by mouth daily.    [provider]  ferrous sulfate 325 (65 FE) MG tablet Take 325 mg by mouth daily with breakfast.    [provider]  Garlic 2585 MG CAPS Take 1 capsule by mouth daily.    [provider]  losartan (COZAAR) 50 MG tablet Take 50 mg by mouth daily.  05/25/15   [provider]  metFORMIN (GLUCOPHAGE) 1000 MG tablet Take 1 tablet by mouth 2 (two) times daily. 05/24/15   [provider]  metoprolol succinate (TOPROL-XL) 50 MG 24 hr tablet Take 50 mg by mouth daily.  12/31/15   [provider]  Multiple Vitamin (MULTIVITAMIN) tablet Take 1 tablet by mouth daily.    [provider]  Omega-3 Fatty Acids (FISH OIL) 1200 MG CAPS Take 1,200 mg by mouth daily.    [provider]  omeprazole (PRILOSEC) 20 MG capsule Take 20 mg by mouth daily. 09/21/14   [provider]  oxybutynin (DITROPAN) 5 MG tablet Take 5 mg by mouth daily.    [provider]  paliperidone (INVEGA) 3 MG 24 hr tablet Take 3 mg by mouth daily.  01/09/16   [provider]  pravastatin (PRAVACHOL) 40 MG tablet Take 40 mg by mouth daily.    [provider]  TRESIBA FLEXTOUCH 200 UNIT/ML SOPN Inject 34 Units into the skin daily.  05/25/15   [provider]      PHYSICAL EXAMINATION:   VITAL SIGNS: Blood pressure 133/64, pulse 89, temperature 97.8 F (36.6 C), temperature source Oral, resp. rate 18,  height 5' (1.524 m), weight 85.7 kg (189 lb), SpO2 98 %.  GENERAL:  66 y.o.-year-old patient lying in the bed with no acute distress.  EYES: Pupils equal, round, reactive to light and accommodation. No scleral icterus. Extraocular muscles intact.  HEENT: Head atraumatic, normocephalic. Oropharynx and nasopharynx clear.  NECK:  Supple, no jugular venous distention. No thyroid enlargement, no tenderness.  LUNGS: Normal breath sounds bilaterally, no wheezing, rales,rhonchi or crepitation. No use of accessory muscles of respiration.  CARDIOVASCULAR: S1, S2 normal. No murmurs, rubs, or gallops.  ABDOMEN: Soft, nontender, nondistended. Bowel sounds present. No organomegaly or mass.  EXTREMITIES: No pedal edema, cyanosis, or clubbing.  NEUROLOGIC: Cranial nerves II through XII are intact. Muscle strength 5/5 in all extremities. Sensation intact. Gait not checked.  PSYCHIATRIC: The patient  is alert and oriented x 3.  SKIN: No obvious rash, lesion, or ulcer.   LABORATORY PANEL:   CBC  Recent Labs Lab 10/14/16 1448  WBC 8.8  HGB 13.4  HCT 41.0  PLT 238  MCV 80.3  MCH 26.3  MCHC 32.8  RDW 16.4*   ------------------------------------------------------------------------------------------------------------------  Chemistries   Recent Labs Lab 10/14/16 1448  NA 136  K 4.7  CL 103  CO2 23  GLUCOSE 197*  BUN 29*  CREATININE 1.29*  CALCIUM 10.6*  AST 22  ALT 24  ALKPHOS 52  BILITOT 0.6   ------------------------------------------------------------------------------------------------------------------ estimated creatinine clearance is 43.4 mL/min (A) (by C-G formula based on SCr of 1.29 mg/dL (H)). ------------------------------------------------------------------------------------------------------------------  Recent Labs  10/16/16 0649  TSH 2.334     Coagulation profile No results for input(s): INR, PROTIME in the last 168  hours. ------------------------------------------------------------------------------------------------------------------- No results for input(s): DDIMER in the last 72 hours. -------------------------------------------------------------------------------------------------------------------  Cardiac Enzymes No results for input(s): CKMB, TROPONINI, MYOGLOBIN in the last 168 hours.  Invalid input(s): CK ------------------------------------------------------------------------------------------------------------------ Invalid input(s): POCBNP  ---------------------------------------------------------------------------------------------------------------  Urinalysis    Component Value Date/Time   COLORURINE YELLOW 11/16/2015 1619   APPEARANCEUR CLEAR 11/16/2015 1619   LABSPEC 1.010 11/16/2015 1619   PHURINE 5.5 11/16/2015 1619   GLUCOSEU NEGATIVE 11/16/2015 1619   HGBUR NEGATIVE 11/16/2015 1619   BILIRUBINUR NEGATIVE 11/16/2015 1619   KETONESUR TRACE (A) 11/16/2015 1619   PROTEINUR NEGATIVE 11/16/2015 1619   UROBILINOGEN 0.2 10/14/2014 0900   NITRITE NEGATIVE 11/16/2015 1619   LEUKOCYTESUR NEGATIVE 11/16/2015 1619     RADIOLOGY: No results found.  EKG: Orders placed or performed during the hospital encounter of 11/16/15  . ED EKG  . ED EKG  . EKG 12-Lead  . EKG 12-Lead  . EKG    IMPRESSION AND PLAN:  * Decreased urine output   Patient has SYMPTOMS of UTI.   I will start on oral cefuroxime for now.   Requested to get stat CBC and BMP.   I encouraged patient to drink a lot of liquids, at least 10 cups of water in a day.   We'll try to collect a urine sample and if patient is not able to give a sample then I advised the nurse to do in and out catheter one time and sent for urinalysis and urine culture.  * Chronic kidney disease stage III   The patient appears slightly dehydrated, we will check BMP stat.   Encouraged the patient to have oral liquids.  *  Hypertension   Blood pressure is stable, continue home medications.  * Diabetes   Continue home medications, on insulin sliding scale coverage.  * Bipolar and anxiety.   Continue management as per psychiatric team.  All the records are reviewed and case discussed with ED provider. Management plans discussed with the patient, family and they are in agreement.  CODE STATUS:    Code Status Orders        Start     Ordered   10/15/16 2308  Full code  Continuous     10/15/16 2307    Code Status History    Date Active Date Inactive Code Status Order ID Comments User Context   10/18/2014  5:16 PM 10/25/2014  4:05 PM Full Code [id number]  Gonzella Lex, MD Inpatient   10/18/2014  4:53 PM 10/18/2014  5:16 PM Full Code [id number]  Hildred Priest, MD Inpatient   10/13/2014  9:27 PM 10/15/2014 11:45 PM Full Code [id number]  Nat Christen,  MD ED   06/02/2013 11:38 AM 06/03/2013 10:51 PM Full Code [id number]  Scherry Ran, MD Inpatient       TOTAL TIME TAKING CARE OF THIS PATIENT: 45 minutes.    Vaughan Basta M.D on 10/16/2016   Between 7am to 6pm - Pager - [phone]  After 6pm go to [email or web address] - password [email or web address] [place] [facility]  Office  [phone]  CC: Primary care physician; Monico Blitz, MD   Note: This dictation was prepared with Dragon dictation along with smaller phrase technology. Any transcriptional errors that result from this process are unintentional.

## 2016-10-16 NOTE — Progress Notes (Signed)
Called to do a I & O catheter on pt.  58F catheter inserted with only drops returning. I talked with the pt. who reports she has been told by her PCP that she has CKF stage 3. She reports she has been drinking lots of water today with only a little bit of urine production.  Asked charge nurse if labs had been done. Noted abn. lab results now back. Encouraged to call the hospitalist with the report of labs.

## 2016-10-16 NOTE — BHH Group Notes (Signed)
[place] LCSW Group Therapy  10/16/2016 1:50 PM  Type of Therapy:  Group Therapy  Participation Level:  Active  Participation Quality:  Attentive  Affect:  Appropriate and Flat  Cognitive:  Alert  Insight:  Limited  Engagement in Therapy:  Limited  Modes of Intervention:  Activity, Discussion, Education, Problem-solving, Reality Testing, Socialization and Support  Summary of Progress/Problems: Balance in life: Patients will discuss the concept of balance and how it looks and feels to be unbalanced. Pt will identify areas in their life that is unbalanced and ways to become more balanced. They discussed what aspects in their lives has influenced their self care. Patients also discussed self care in the areas of self regulation/control, hygiene/appearance, sleep/relaxation, healthy leisure, healthy eating habits, exercise, inner peace/spirituality, self improvement, sobriety, and health management. They were challenged to identify changes that are needed in order to improve self care.   Khushboo Chuck G. [place], [place] 10/16/2016, 1:50 PM

## 2016-10-16 NOTE — Plan of Care (Signed)
Problem: Health Behavior/Discharge Planning: Goal: Ability to manage health-related needs will improve Outcome: Progressing Patient remains compliant with treatment, following recommendations

## 2016-10-16 NOTE — Progress Notes (Signed)
   10/16/16 1600  Clinical Encounter Type  Visited With Patient not available   [place] responded to nurse request to see pt. Pt was with staff. [place] will try again to speak with pt.

## 2016-10-16 NOTE — Progress Notes (Addendum)
Patient newly admitted to the unit. Skin assessment performed by this Probation officer staffed with Britt Bolognese, RN. No contraband found. Skin intact, no open wound, bo bruises, no rash.

## 2016-10-16 NOTE — Progress Notes (Signed)
Recreation Therapy Notes  INPATIENT RECREATION THERAPY ASSESSMENT  Patient Details Name: Erika Jacobs MRN: [id number] DOB: 09/20/1950 Today's Date: 10/16/2016  Patient Stressors: Family, Death, Other (Comment) (Took god daughter to the hospital for headaches and she was diagnosed with migraine headaches)  Coping Skills:   Avoidance, Exercise, Talking, Music, Other (Comment) (Pray)  Personal Challenges: Concentration, Relationships, Stress Management  Leisure Interests (2+):  Individual - TV, Individual - Other (Comment) (Be with grandchildren)  Awareness of Community Resources:  Yes  Community Resources:  [employer]  Current Use: Yes  If no, Barriers?:    Patient Strengths:  Good grandma, kind and loving person  Patient Identified Areas of Improvement:  Taking medications and have a man in her life  Current Recreation Participation:  Spending time with grandchildren  Patient Goal for Hospitalization:  To get back on track with medications  [place] of Residence:  [place] of Residence:  [place]   Current [place] (including self-harm):  No  Current [place]:  No  Consent to Intern Participation: N/A   Leonette Monarch, LRT/CTRS 10/16/2016, 4:33 PM

## 2016-10-16 NOTE — Tx Team (Signed)
Initial Treatment Plan 10/16/2016 12:45 AM DANIELA SIEBERS DGL:[id number]    PATIENT STRESSORS: Health problems Loss of we are 6, I lost a younger sister in March to [place], adopted son in prison for a rape charge, he went in at 36, he is 70 now; my Aunt in the hospital dying...."   PATIENT STRENGTHS: Ability for insight Active sense of humor Average or above average intelligence Capable of independent living Religious Affiliation Supportive family/friends   PATIENT IDENTIFIED PROBLEMS: Mood Instability  Suicidal Ideations "I don't know why I said that but I am not going to hurt myself or anyone else.."  Anxiety  DM II ---CBG monitor per physician order               DISCHARGE CRITERIA:  Improved stabilization in mood, thinking, and/or behavior Motivation to continue treatment in a less acute level of care Need for constant or close observation no longer present Verbal commitment to aftercare and medication compliance  PRELIMINARY DISCHARGE PLAN: Outpatient therapy Return to previous living arrangement  PATIENT/FAMILY INVOLVEMENT: This treatment plan has been presented to and reviewed with the patient, CHANNON BROUGHER.  The patient have been given the opportunity to ask questions and make suggestions.  Electa Sniff, RN 10/16/2016, 12:45 AM

## 2016-10-16 NOTE — BHH Suicide Risk Assessment (Signed)
[place] INPATIENT:  Family/Significant Other Suicide Prevention Education  Suicide Prevention Education:  Contact Attempts: Kathryne Eriksson, daughter in law, [phone], has been identified by the patient as the family member/significant other with whom the patient will be residing, and identified as the person(s) who will aid the patient in the event of a mental health crisis.  With written consent from the patient, two attempts were made to provide suicide prevention education, prior to and/or following the patient's discharge.  We were unsuccessful in providing suicide prevention education.  A suicide education pamphlet was given to the patient to share with family/significant other.  Date and time of first attempt:10/16/16, 1149 Date and time of second attempt:  Joanne Chars, LCSW 10/16/2016, 11:49 AM

## 2016-10-16 NOTE — [facility] Suicide Risk Assessment (Signed)
[facility] Admission Suicide Risk Assessment   Nursing information obtained from:    Demographic factors:    Current Mental Status:    Loss Factors:    Historical Factors:    Risk Reduction Factors:     Total Time spent with patient: 1 hour Principal Problem: Bipolar I disorder, severe, current or most recent episode depressed, with psychotic features [facility]) Diagnosis:   Patient Active Problem List   Diagnosis Date Noted  . Bipolar I disorder, severe, current or most recent episode depressed, with psychotic features ([place]) [F31.5] 10/20/2014  . Dyslipidemia [E78.5] 10/18/2014  . GERD (gastroesophageal reflux disease) [K21.9] 10/18/2014  . Hypothyroidism [E03.9] 10/18/2014  . Diabetes ([place]) [E11.9] 10/17/2014  . Hypertension [I10] 10/17/2014  . Urinary incontinence [R32] 10/17/2014  . Suicidal ideation [R45.851]   . Cholecystitis with cholelithiasis [K80.10] 06/02/2013   Subjective Data: psychotic break.  Continued Clinical Symptoms:  Alcohol Use Disorder Identification Test Final Score (AUDIT): 0 The "Alcohol Use Disorders Identification Test", Guidelines for Use in Primary Care, Second Edition.  World Pharmacologist [facility]). Score between 0-7:  no or low risk or alcohol related problems. Score between 8-15:  moderate risk of alcohol related problems. Score between 16-19:  high risk of alcohol related problems. Score 20 or above:  warrants further diagnostic evaluation for alcohol dependence and treatment.   CLINICAL FACTORS:   Bipolar Disorder:   Depressive phase Currently Psychotic   Musculoskeletal: Strength & Muscle Tone: within normal limits Gait & Station: normal Patient leans: N/A  Psychiatric Specialty Exam: Physical Exam  Nursing note and vitals reviewed. Psychiatric: Her speech is normal and behavior is normal. Judgment normal. Her mood appears anxious. Cognition and memory are normal. She exhibits a depressed mood. She expresses suicidal ideation.    Review of  Systems  Gastrointestinal: Positive for diarrhea.  Genitourinary:       Urinary retention  Psychiatric/Behavioral: Positive for depression and suicidal ideas. The patient is nervous/anxious.   All other systems reviewed and are negative.   Blood pressure 133/64, pulse 89, temperature 97.8 F (36.6 C), temperature source Oral, resp. rate 18, height 5' (1.524 m), weight 85.7 kg (189 lb), SpO2 98 %.Body mass index is 36.91 kg/m.  General Appearance: Casual  Eye Contact:  Good  Speech:  Clear and Coherent  Volume:  Normal  Mood:  Anxious and Depressed  Affect:  Congruent  Thought Process:  Goal Directed and Descriptions of Associations: Intact  Orientation:  Full (Time, Place, and Person)  Thought Content:  Hallucinations: Auditory  Suicidal Thoughts:  Yes.  with intent/plan  Homicidal Thoughts:  No  Memory:  Immediate;   Fair Recent;   Fair Remote;   Fair  Judgement:  Fair  Insight:  Fair  Psychomotor Activity:  Normal  Concentration:  Concentration: Fair and Attention Span: Fair  Recall:  [employer] of Knowledge:  Fair  Language:  Fair  Akathisia:  No  Handed:  Right  AIMS (if indicated):     Assets:  Communication Skills Desire for Improvement Financial Resources/Insurance Housing Resilience Social Support  ADL's:  Intact  Cognition:  WNL  Sleep:  Number of Hours: 4.45      COGNITIVE FEATURES THAT CONTRIBUTE TO RISK:  None    SUICIDE RISK:   Moderate:  Frequent suicidal ideation with limited intensity, and duration, some specificity in terms of plans, no associated intent, good self-control, limited dysphoria/symptomatology, some risk factors present, and identifiable protective factors, including available and accessible social support.  PLAN OF  CARE: Hospital admission, medication management, discharge planning.  Erika Jacobs is a 66 year old female with a history of bipolar illness admitted for suicidal ideation and auditory hallucinations.   1. Psychosis.  We continued Invega and increase dose to 6 mg for psychosis and mood stabilization. We will restart Effexor for depression.   2. Metabolic syndrome monitoring. Lipid panel, TSH and HgbA1C are pending.  3. EKG. Pending.  4. Insomnia. Low blood pressure and only 4 hours of sleep with Trazodone. We will switch to Ambien.   5. HTN. She is on Cozaar, Metoprolol and ASA. We will decrease Metoprolol.  6. Dyslipidemia. She is on Pravachol.  7. Diabetes. The patient is paranoid about Metformin believing that it affects her kidneys and wants to be switched to another medication. We will switch to Glucotrol. We continue SSI, ADA diet and blood glucose monitoring.  8. Anemia. She refuses iron pills  9. Vit D defficiency. She refuses supplementation.  10. Vit B12 defficiancy. We will recheck the level.  11. Urinary incontinence. She reports diarrhea for the past 3 days and now low volume of urine. We will stop Ditropan and order bladder scan. The patient is preoccupied with the kidney disease which she believes will shortly lead to dialysis.   12. GERD. She refuses Protonix.   13. Disposition. She will be dicharged to home. She will follow up with [facility] Hoyle [facility] at [facility].     I certify that inpatient services furnished can reasonably be expected to improve the patient's condition.   Erika Slick, MD 10/16/2016, 10:05 AM

## 2016-10-16 NOTE — Plan of Care (Signed)
Problem: Pain Managment: Goal: General experience of comfort will improve Pt's pain will be managed during hospitalization Outcome: Progressing Pain assessed every shift and as needed. Patient denies pain

## 2016-10-16 NOTE — Progress Notes (Addendum)
2200: UA ordered. Urine output  Only 10cc. In&Out attempted, only a few drops out. Supervisor encouraged  more fluid intake. Hospitalist was paged. Dr Einar Grad [phone] was contacted, message left . Na 73, Creatinine 5.78. (please see labs). RN continue to monitor, waiting for MD response.  2240: Hospitalist called back and patient's condition discussed. MD recommended to encourage more fluid and monitor and reevaluate in AM.

## 2016-10-17 ENCOUNTER — Inpatient Hospital Stay: Payer: Medicare Other

## 2016-10-17 ENCOUNTER — Inpatient Hospital Stay
Admission: AD | Admit: 2016-10-17 | Discharge: 2016-10-21 | DRG: 683 | Disposition: A | Discharge status: Home / Self Care | Payer: Medicare Other | Source: Ambulatory Visit | Attending: Specialist | Admitting: Specialist

## 2016-10-17 ENCOUNTER — Encounter: Payer: Self-pay | Admitting: *Deleted

## 2016-10-17 ENCOUNTER — Inpatient Hospital Stay
Admission: AD | Disposition: A | Discharge status: Home / Self Care | Payer: Self-pay | Source: Ambulatory Visit | Attending: Specialist

## 2016-10-17 DIAGNOSIS — Z87891 Personal history of nicotine dependence: Secondary | ICD-10-CM

## 2016-10-17 DIAGNOSIS — N183 Chronic kidney disease, stage 3 unspecified: Secondary | ICD-10-CM | POA: Diagnosis present

## 2016-10-17 DIAGNOSIS — E872 Acidosis: Secondary | ICD-10-CM | POA: Diagnosis present

## 2016-10-17 DIAGNOSIS — N186 End stage renal disease: Secondary | ICD-10-CM | POA: Diagnosis present

## 2016-10-17 DIAGNOSIS — Z9114 Patient's other noncompliance with medication regimen: Secondary | ICD-10-CM

## 2016-10-17 DIAGNOSIS — K5909 Other constipation: Secondary | ICD-10-CM | POA: Diagnosis present

## 2016-10-17 DIAGNOSIS — Z79899 Other long term (current) drug therapy: Secondary | ICD-10-CM | POA: Diagnosis not present

## 2016-10-17 DIAGNOSIS — D72829 Elevated white blood cell count, unspecified: Secondary | ICD-10-CM

## 2016-10-17 DIAGNOSIS — E871 Hypo-osmolality and hyponatremia: Secondary | ICD-10-CM | POA: Diagnosis present

## 2016-10-17 DIAGNOSIS — R809 Proteinuria, unspecified: Secondary | ICD-10-CM | POA: Diagnosis present

## 2016-10-17 DIAGNOSIS — E039 Hypothyroidism, unspecified: Secondary | ICD-10-CM | POA: Diagnosis present

## 2016-10-17 DIAGNOSIS — R34 Anuria and oliguria: Secondary | ICD-10-CM | POA: Diagnosis present

## 2016-10-17 DIAGNOSIS — E785 Hyperlipidemia, unspecified: Secondary | ICD-10-CM | POA: Diagnosis present

## 2016-10-17 DIAGNOSIS — E162 Hypoglycemia, unspecified: Secondary | ICD-10-CM | POA: Diagnosis not present

## 2016-10-17 DIAGNOSIS — F419 Anxiety disorder, unspecified: Secondary | ICD-10-CM | POA: Diagnosis present

## 2016-10-17 DIAGNOSIS — Z9049 Acquired absence of other specified parts of digestive tract: Secondary | ICD-10-CM

## 2016-10-17 DIAGNOSIS — N3281 Overactive bladder: Secondary | ICD-10-CM | POA: Diagnosis present

## 2016-10-17 DIAGNOSIS — E1122 Type 2 diabetes mellitus with diabetic chronic kidney disease: Secondary | ICD-10-CM | POA: Diagnosis present

## 2016-10-17 DIAGNOSIS — I959 Hypotension, unspecified: Secondary | ICD-10-CM | POA: Diagnosis present

## 2016-10-17 DIAGNOSIS — I10 Essential (primary) hypertension: Secondary | ICD-10-CM

## 2016-10-17 DIAGNOSIS — E1165 Type 2 diabetes mellitus with hyperglycemia: Secondary | ICD-10-CM | POA: Diagnosis present

## 2016-10-17 DIAGNOSIS — K219 Gastro-esophageal reflux disease without esophagitis: Secondary | ICD-10-CM | POA: Diagnosis present

## 2016-10-17 DIAGNOSIS — E1142 Type 2 diabetes mellitus with diabetic polyneuropathy: Secondary | ICD-10-CM | POA: Diagnosis present

## 2016-10-17 DIAGNOSIS — Z888 Allergy status to other drugs, medicaments and biological substances status: Secondary | ICD-10-CM

## 2016-10-17 DIAGNOSIS — I120 Hypertensive chronic kidney disease with stage 5 chronic kidney disease or end stage renal disease: Secondary | ICD-10-CM | POA: Diagnosis present

## 2016-10-17 DIAGNOSIS — R45851 Suicidal ideations: Secondary | ICD-10-CM | POA: Diagnosis present

## 2016-10-17 DIAGNOSIS — R319 Hematuria, unspecified: Secondary | ICD-10-CM | POA: Diagnosis present

## 2016-10-17 DIAGNOSIS — N179 Acute kidney failure, unspecified: Secondary | ICD-10-CM | POA: Diagnosis not present

## 2016-10-17 DIAGNOSIS — F315 Bipolar disorder, current episode depressed, severe, with psychotic features: Secondary | ICD-10-CM | POA: Diagnosis present

## 2016-10-17 DIAGNOSIS — F329 Major depressive disorder, single episode, unspecified: Secondary | ICD-10-CM | POA: Diagnosis not present

## 2016-10-17 DIAGNOSIS — E119 Type 2 diabetes mellitus without complications: Secondary | ICD-10-CM

## 2016-10-17 DIAGNOSIS — Z7982 Long term (current) use of aspirin: Secondary | ICD-10-CM

## 2016-10-17 DIAGNOSIS — R000 Tachycardia, unspecified: Secondary | ICD-10-CM

## 2016-10-17 DIAGNOSIS — E860 Dehydration: Secondary | ICD-10-CM | POA: Diagnosis present

## 2016-10-17 DIAGNOSIS — N390 Urinary tract infection, site not specified: Secondary | ICD-10-CM | POA: Diagnosis present

## 2016-10-17 HISTORY — PX: DIALYSIS/PERMA CATHETER INSERTION: CATH118288

## 2016-10-17 LAB — GLUCOSE, CAPILLARY
GLUCOSE-CAPILLARY: 33 mg/dL — AB (ref 65–99)
GLUCOSE-CAPILLARY: 51 mg/dL — AB (ref 65–99)
GLUCOSE-CAPILLARY: 159 mg/dL — AB (ref 65–99)
GLUCOSE-CAPILLARY: 186 mg/dL — AB (ref 65–99)
GLUCOSE-CAPILLARY: 83 mg/dL (ref 65–99)
Glucose-Capillary: 29 mg/dL — CL (ref 65–99)
Glucose-Capillary: 42 mg/dL — CL (ref 65–99)
Glucose-Capillary: 64 mg/dL — ABNORMAL LOW (ref 65–99)
Glucose-Capillary: 87 mg/dL (ref 65–99)
Glucose-Capillary: 107 mg/dL — ABNORMAL HIGH (ref 65–99)
Glucose-Capillary: 63 mg/dL — ABNORMAL LOW (ref 65–99)
Glucose-Capillary: 69 mg/dL (ref 65–99)

## 2016-10-17 LAB — SALICYLATE LEVEL: Salicylate Lvl: <7 mg/dL (ref 2.8–30.0)

## 2016-10-17 LAB — BASIC METABOLIC PANEL
ANION GAP: 21 — AB (ref 5–15)
BUN: 81 mg/dL — ABNORMAL HIGH (ref 6–20)
CALCIUM: 8.3 mg/dL — AB (ref 8.9–10.3)
CO2: 12 mmol/L — AB (ref 22–32)
Chloride: 90 mmol/L — ABNORMAL LOW (ref 101–111)
Creatinine, Ser: 6.53 mg/dL — ABNORMAL HIGH (ref 0.44–1.00)
GFR calc non Af Amer: 6 mL/min — ABNORMAL LOW (ref >60)
GFR, EST AFRICAN AMERICAN: 7 mL/min — AB (ref >60)
Glucose, Bld: 109 mg/dL — ABNORMAL HIGH (ref 65–99)
Potassium: 4.8 mmol/L (ref 3.5–5.1)
SODIUM: 123 mmol/L — AB (ref 135–145)

## 2016-10-17 LAB — RAPID HIV SCREEN (HIV 1/2 AB+AG)
HIV 1/2 Antibodies: NONREACTIVE
HIV-1 P24 Antigen - HIV24: NONREACTIVE

## 2016-10-17 LAB — TROPONIN I: Troponin I: 0.05 ng/mL (ref <0.03)

## 2016-10-17 LAB — TSH: TSH: 1.54 u[IU]/mL (ref 0.350–4.500)

## 2016-10-17 LAB — HEMOGLOBIN A1C
HEMOGLOBIN A1C: 7.2 % — AB (ref 4.8–5.6)
MEAN PLASMA GLUCOSE: 160 mg/dL

## 2016-10-17 LAB — LACTIC ACID, PLASMA
LACTIC ACID, VENOUS: 3.8 mmol/L — AB (ref 0.5–1.9)
Lactic Acid, Venous: 4.5 mmol/L (ref 0.5–1.9)

## 2016-10-17 LAB — PROTIME-INR
INR: 1.01
Prothrombin Time: 13.3 seconds (ref 11.4–15.2)

## 2016-10-17 LAB — PHOSPHORUS: Phosphorus: 7.4 mg/dL — ABNORMAL HIGH (ref 2.5–4.6)

## 2016-10-17 LAB — CK: Total CK: 106 U/L (ref 38–234)

## 2016-10-17 LAB — MAGNESIUM: MAGNESIUM: 1.4 mg/dL — AB (ref 1.7–2.4)

## 2016-10-17 SURGERY — DIALYSIS/PERMA CATHETER INSERTION
Anesthesia: LOCAL

## 2016-10-17 MED ORDER — SODIUM CHLORIDE 0.9 % IV BOLUS (SEPSIS)
500.0000 mL | Freq: Once | INTRAVENOUS | Status: AC
  Administered 2016-10-17: 500 mL via INTRAVENOUS

## 2016-10-17 MED ORDER — SODIUM CHLORIDE 0.9 % IV SOLN: INTRAVENOUS | Status: DC

## 2016-10-17 MED ORDER — SODIUM CHLORIDE 0.9 % IV SOLN: 100.0000 mL | INTRAVENOUS | Status: DC | PRN

## 2016-10-17 MED ORDER — PALIPERIDONE ER 3 MG PO TB24
3.0000 mg | ORAL_TABLET | Freq: Every day | ORAL | Status: DC
  Administered 2016-10-17 – 2016-10-21 (×5): 3 mg via ORAL
  Filled 2016-10-17 (×6): qty 1

## 2016-10-17 MED ORDER — ACETAMINOPHEN 650 MG RE SUPP: 650.0000 mg | Freq: Four times a day (QID) | RECTAL | Status: DC | PRN

## 2016-10-17 MED ORDER — ALTEPLASE 2 MG IJ SOLR
2.0000 mg | Freq: Once | INTRAMUSCULAR | Status: DC | PRN
Start: 2016-10-17 — End: 2016-10-17

## 2016-10-17 MED ORDER — CEFAZOLIN SODIUM-DEXTROSE 1-4 GM/50ML-% IV SOLN
1.0000 g | INTRAVENOUS | Status: AC
  Filled 2016-10-17: qty 50

## 2016-10-17 MED ORDER — ONDANSETRON HCL 4 MG/2ML IJ SOLN: 4.0000 mg | Freq: Four times a day (QID) | INTRAMUSCULAR | Status: DC | PRN

## 2016-10-17 MED ORDER — LIDOCAINE-PRILOCAINE 2.5-2.5 % EX CREA
1.0000 "application " | TOPICAL_CREAM | CUTANEOUS | Status: DC | PRN
  Filled 2016-10-17: qty 5

## 2016-10-17 MED ORDER — HEPARIN SODIUM (PORCINE) 5000 UNIT/ML IJ SOLN
5000.0000 [IU] | Freq: Three times a day (TID) | INTRAMUSCULAR | Status: DC
  Administered 2016-10-17 – 2016-10-21 (×10): 5000 [IU] via SUBCUTANEOUS
  Filled 2016-10-17 (×10): qty 1

## 2016-10-17 MED ORDER — SODIUM CHLORIDE 0.9 % IV SOLN
100.0000 mL | INTRAVENOUS | Status: DC | PRN
Start: 2016-10-17 — End: 2016-10-17

## 2016-10-17 MED ORDER — SODIUM CHLORIDE 0.9 % IV BOLUS (SEPSIS): 500.0000 mL | Freq: Once | INTRAVENOUS | Status: DC

## 2016-10-17 MED ORDER — MAGNESIUM SULFATE 4 GM/100ML IV SOLN
4.0000 g | Freq: Once | INTRAVENOUS | Status: AC
  Administered 2016-10-17: 4 g via INTRAVENOUS
  Filled 2016-10-17: qty 100

## 2016-10-17 MED ORDER — INSULIN ASPART 100 UNIT/ML ~~LOC~~ SOLN: 0.0000 [IU] | Freq: Every day | SUBCUTANEOUS | Status: DC

## 2016-10-17 MED ORDER — ACETAMINOPHEN 325 MG PO TABS: 650.0000 mg | ORAL_TABLET | Freq: Four times a day (QID) | ORAL | Status: DC | PRN

## 2016-10-17 MED ORDER — INSULIN ASPART 100 UNIT/ML ~~LOC~~ SOLN
0.0000 [IU] | Freq: Three times a day (TID) | SUBCUTANEOUS | Status: DC
  Administered 2016-10-17: 2 [IU] via SUBCUTANEOUS
  Administered 2016-10-18 – 2016-10-19 (×2): 1 [IU] via SUBCUTANEOUS
  Administered 2016-10-19: 5 [IU] via SUBCUTANEOUS
  Administered 2016-10-19: 2 [IU] via SUBCUTANEOUS
  Administered 2016-10-20 (×2): 1 [IU] via SUBCUTANEOUS
  Administered 2016-10-20: 2 [IU] via SUBCUTANEOUS
  Administered 2016-10-21 (×2): 1 [IU] via SUBCUTANEOUS
  Filled 2016-10-17 (×10): qty 1

## 2016-10-17 MED ORDER — MIDAZOLAM HCL 2 MG/ML PO SYRP
8.0000 mg | ORAL_SOLUTION | Freq: Once | ORAL | Status: AC
  Administered 2016-10-17: 8 mg via ORAL

## 2016-10-17 MED ORDER — LIDOCAINE HCL (PF) 1 % IJ SOLN
INTRAMUSCULAR | Status: DC | PRN
Start: 2016-10-17 — End: 2016-10-17
  Administered 2016-10-17: 5 mL via INTRADERMAL

## 2016-10-17 MED ORDER — SODIUM CHLORIDE 0.9 % IV SOLN
INTRAVENOUS | Status: DC
  Administered 2016-10-17 – 2016-10-19 (×3): via INTRAVENOUS

## 2016-10-17 MED ORDER — LIDOCAINE HCL (PF) 1 % IJ SOLN
5.0000 mL | INTRAMUSCULAR | Status: DC | PRN
  Filled 2016-10-17: qty 5

## 2016-10-17 MED ORDER — SODIUM CHLORIDE 0.9% FLUSH
3.0000 mL | Freq: Two times a day (BID) | INTRAVENOUS | Status: DC
  Administered 2016-10-17 – 2016-10-21 (×3): 3 mL via INTRAVENOUS

## 2016-10-17 MED ORDER — ONDANSETRON HCL 4 MG PO TABS
4.0000 mg | ORAL_TABLET | Freq: Four times a day (QID) | ORAL | Status: DC | PRN
  Administered 2016-10-17: 4 mg via ORAL
  Filled 2016-10-17: qty 1

## 2016-10-17 MED ORDER — INSULIN ASPART 100 UNIT/ML ~~LOC~~ SOLN
3.0000 [IU] | Freq: Three times a day (TID) | SUBCUTANEOUS | Status: DC
  Administered 2016-10-17 – 2016-10-21 (×11): 3 [IU] via SUBCUTANEOUS
  Filled 2016-10-17 (×12): qty 1

## 2016-10-17 MED ORDER — HEPARIN SODIUM (PORCINE) 1000 UNIT/ML DIALYSIS
1000.0000 [IU] | INTRAMUSCULAR | Status: DC | PRN
  Filled 2016-10-17: qty 1

## 2016-10-17 MED ORDER — PENTAFLUOROPROP-TETRAFLUOROETH EX AERO
1.0000 "application " | INHALATION_SPRAY | CUTANEOUS | Status: DC | PRN
  Filled 2016-10-17: qty 30

## 2016-10-17 MED ORDER — MIDAZOLAM HCL 2 MG/ML PO SYRP
ORAL_SOLUTION | ORAL | Status: AC
  Filled 2016-10-17: qty 4

## 2016-10-17 SURGICAL SUPPLY — 4 items
CANNULA 5F STIFF (CANNULA) ×1 IMPLANT
GUIDEWIRE AMPLATZ SHORT (WIRE) ×1 IMPLANT
KIT DIALYSIS CATH TRI 30X13 (CATHETERS) ×1 IMPLANT
PACK ANGIOGRAPHY (CUSTOM PROCEDURE TRAY) ×1 IMPLANT

## 2016-10-17 NOTE — Progress Notes (Signed)
Patient called staff to report that she had just vomited. Reported that the output "was just water". Patient reported that she drank a lot of water quickly "and that might be the reason". Patient appears worried and curious about her health condition. Pt's bed was changed and pt went back to sleep. Currently remains asleep and safety precautions reinforced.

## 2016-10-17 NOTE — Progress Notes (Signed)
Inpatient Diabetes Program Recommendations  AACE/ADA: New Consensus Statement on Inpatient Glycemic Control (2015)  Target Ranges:  Prepandial:   less than 140 mg/dL      Peak postprandial:   less than 180 mg/dL (1-2 hours)      Critically ill patients:  140 - 180 mg/dL   Lab Results  Component Value Date   GLUCAP 87 10/17/2016   HGBA1C 7.2 (H) 10/16/2016    Review of Glycemic Control  Results for GENNAVIEVE, HUQ (MRN [id number]) as of 10/17/2016 09:14  Ref. Range 10/17/2016 06:37 10/17/2016 06:51 10/17/2016 06:58 10/17/2016 07:19 10/17/2016 07:34  Glucose-Capillary Latest Ref Range: 65 - 99 mg/dL 29 (LL) 42 (LL) 51 (L) 64 (L) 87    Diabetes history: Type 2 Outpatient Diabetes medications: Metformin 1000mg  bid Current orders for Inpatient glycemic control: Novolog 0-9 units tid, Novolog 0-5 units qhs  Inpatient Diabetes Program Recommendations:  Low blood sugars as a result of Glucotrol.  Agree with current orders for Novolog correction tid and hs.   Patient has very poor renal function- she may do best on low dose Lantus basal insulin at discharge - could start with 8 units Lantus qhs (0.1unit/kg).   Gentry Fitz, RN, BA, MHA, CDE Diabetes Coordinator Inpatient Diabetes Program  [phone] (Team Pager) [phone] ([place]) 10/17/2016 9:20 AM

## 2016-10-17 NOTE — Progress Notes (Signed)
Transferring pt to room 251 on medical floor via wheelchair with officer present as pt is IVC. Safety maintained. Will continue to monitor.

## 2016-10-17 NOTE — Progress Notes (Signed)
Report given to Mia Creek, RN on medical floor. Verbalizes understanding of report given. Mia Creek, RN requests that pt not be transferred until bedside sitter is available. Mia Creek states he will call and let unit know when sitter is available. Will continue to monitor.

## 2016-10-17 NOTE — Progress Notes (Signed)
[place] [facility] [place] NAME: Erika Jacobs    MR#:  [id number]  DATE OF BIRTH:  1950-10-25  SUBJECTIVE:  CHIEF COMPLAINT:  No chief complaint on file.  Patient is 66 year old Caucasian female with atypical history significant for history of bipolar depression, hyperlipidemia, gastroesophageal reflux disease, hypothyroidism, diabetes, hypertension, who presents to the hospital with suicidal ideation and was admitted to behavioral medicine unit. She noted to have decreased frequency of urination over the past few  days, some mild dysuria, chills. She reported diarrhea prior to coming to the hospital. Initial labs were unremarkable. Patient was admitted to behavioral medicine. Consultation was requested, and repeated labs were obtained. Creatinine was found to be markedly elevated to close to 6. Patient was transferred to medicine for management. Patient was noted to have hypotension, June 27  And 28, 2018. The patient was seen by nephrologist, who recommended temporary hemodialysis catheter placement and dialysis initiation for acute renal failure. Review of Systems  Constitutional: Positive for chills and malaise/fatigue. Negative for fever and weight loss.  HENT: Negative for congestion.   Eyes: Negative for blurred vision and double vision.  Respiratory: Negative for cough, sputum production, shortness of breath and wheezing.   Cardiovascular: Negative for chest pain, palpitations, orthopnea, leg swelling and PND.  Gastrointestinal: Negative for abdominal pain, blood in stool, constipation, diarrhea, nausea and vomiting.  Genitourinary: Positive for dysuria. Negative for frequency, hematuria and urgency.  Musculoskeletal: Negative for falls.  Neurological: Negative for dizziness, tremors, focal weakness and headaches.  Endo/Heme/Allergies: Does not bruise/bleed easily.  Psychiatric/Behavioral: Negative for depression. The patient does not  have insomnia.     VITAL SIGNS: Blood pressure 138/60, pulse 86, temperature 97.7 F (36.5 C), temperature source Oral, resp. rate (!) 22, height (!) 5" (0.127 m), weight 85.7 kg (189 lb), SpO2 96 %.  PHYSICAL EXAMINATION:   GENERAL:  66 y.o.-year-old pale patient lying in the bed with no acute distress, dry oral mucosa.  EYES: Pupils equal, round, reactive to light and accommodation. No scleral icterus. Extraocular muscles intact.  HEENT: Head atraumatic, normocephalic. Oropharynx and nasopharynx clear.  NECK:  Supple, no jugular venous distention. No thyroid enlargement, no tenderness.  LUNGS: Normal breath sounds bilaterally, no wheezing, rales,rhonchi or crepitation. No use of accessory muscles of respiration.  CARDIOVASCULAR: S1, S2 normal. No murmurs, rubs, or gallops.  ABDOMEN: Soft, nontender, nondistended. Bowel sounds present. No organomegaly or mass.  EXTREMITIES: No pedal edema, cyanosis, or clubbing.  NEUROLOGIC: Cranial nerves II through XII are intact. Muscle strength 5/5 in all extremities. Sensation intact. Gait not checked.  PSYCHIATRIC: The patient is alert and oriented x 3.  SKIN: No obvious rash, lesion, or ulcer.   ORDERS/RESULTS REVIEWED:   CBC  Recent Labs Lab 10/14/16 1448 10/16/16 2002  WBC 8.8 11.1*  HGB 13.4 12.3  HCT 41.0 38.3  PLT 238 183  MCV 80.3 82.3  MCH 26.3 26.4  MCHC 32.8 32.1  RDW 16.4* 16.1*   ------------------------------------------------------------------------------------------------------------------  Chemistries   Recent Labs Lab 10/14/16 1448 10/16/16 2002 10/17/16 0822  NA 136 127* 123*  K 4.7 5.0 4.8  CL 103 96* 90*  CO2 23 15* 12*  GLUCOSE 197* 114* 109*  BUN 29* 73* 81*  CREATININE 1.29* 5.78* 6.53*  CALCIUM 10.6* 8.9 8.3*  MG  --   --  1.4*  AST 22  --   --   ALT 24  --   --   ALKPHOS 52  --   --  BILITOT 0.6  --   --     ------------------------------------------------------------------------------------------------------------------ CrCl cannot be calculated (Unknown ideal weight.). ------------------------------------------------------------------------------------------------------------------  Recent Labs  10/17/16 0822  TSH 1.540    Cardiac Enzymes  Recent Labs Lab 10/17/16 0822  TROPONINI <0.03   ------------------------------------------------------------------------------------------------------------------ Invalid input(s): POCBNP ---------------------------------------------------------------------------------------------------------------  RADIOLOGY: US Renal  Result Date: 10/17/2016 CLINICAL DATA:  Acute renal failure EXAM: RENAL / URINARY TRACT ULTRASOUND COMPLETE COMPARISON:  None. FINDINGS: Right Kidney: Length: 11.7. Echogenicity within normal limits. No mass or hydronephrosis visualized. Left Kidney: Length: 11.7. Echogenicity within normal limits. No mass or hydronephrosis visualized. Bladder: Decompressed.  Appears normal for degree of bladder distention. IMPRESSION: Normal renal ultrasound. Electronically Signed   By: Franki Cabot M.D.   On: 10/17/2016 16:35    EKG:  Orders placed or performed during the hospital encounter of 11/16/15  . ED EKG  . ED EKG  . EKG 12-Lead  . EKG 12-Lead  . EKG    ASSESSMENT AND PLAN:  Active Problems:   Acute renal failure (ARF) (HCC)   Hypotension   Tachycardia   Leukocytosis  #1. Acute renal failure of unclear etiology, status post temporary hemanalysis catheter placement and hemodialysis initiation by nephrologist Dr. Holley Raring today, continue IV fluids, follow urinary output, kidney function, Initially, urinalysis could not be obtained due to anuria. Discussed with Dr. Holley Raring #2. Acidosis, due to lactic acidosis and renal failure, follow after dialysis, etiology is unclear #3. Hyponatremia, follow with hydration #4. Hyperglycemia,  check intact PTH #5. Hypomagnesemia, supplement intravenously #6. Lactic acidosis, continue IV fluids #7. Diabetes mellitus type 2, hemoglobin A1c 7.2, continue sliding scale insulin #8. Bipolar disorder, suicidal ideation, urine drug screen is negative, the psychiatrist involved to follow patient along, pharmacy to dose medications according to renal failure Management plans discussed with the patient, family and they are in agreement.   DRUG ALLERGIES:  Allergies  Allergen Reactions  . Aripiprazole Other (See Comments)    Pt shakes when taking this medication.  Lorayne Bender [Paliperidone]     High doses causes patient to shake  . Lisinopril Other (See Comments)    Kidney   . Risperidone And Related     hallucinations    CODE STATUS:     Code Status Orders        Start     Ordered   10/17/16 1116  Full code  Continuous     10/17/16 1115    Code Status History    Date Active Date Inactive Code Status Order ID Comments User Context   10/15/2016 11:07 PM 10/17/2016 10:19 AM Full Code [id number]  Clovis Fredrickson, MD Inpatient   10/18/2014  5:16 PM 10/25/2014  4:05 PM Full Code [id number]  Gonzella Lex, MD Inpatient   10/18/2014  4:53 PM 10/18/2014  5:16 PM Full Code [id number]  Hildred Priest, MD Inpatient   10/13/2014  9:27 PM 10/15/2014 11:45 PM Full Code [id number]  Nat Christen, MD ED   06/02/2013 11:38 AM 06/03/2013 10:51 PM Full Code [id number]  Scherry Ran, MD Inpatient      TOTAL Critical care TIME TAKING CARE OF THIS PATIENT: 60 minutes.  Discussed with Dr. Shela Commons M.D on 10/17/2016 at 5:40 PM  Between 7am to 6pm - Pager - [phone]  After 6pm go to [email or web address] - password [email or web address] [facility]  [facility]  Office  [phone]  CC: Primary care physician; Monico Blitz, MD
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
## 2016-10-17 NOTE — Progress Notes (Signed)
HD COMPLETED  

## 2016-10-17 NOTE — Progress Notes (Signed)
[facility] informed of transfer to medical floor order. Awaiting bed assignment.

## 2016-10-17 NOTE — Progress Notes (Signed)
MEDICATION RELATED CONSULT NOTE - INITIAL   Pharmacy Consult for Adjust medications for severe renal failure Indication: severe renal failure  Allergies  Allergen Reactions  . Aripiprazole Other (See Comments)    Pt shakes when taking this medication.  Lorayne Bender [Paliperidone]     High doses causes patient to shake  . Lisinopril Other (See Comments)    Kidney   . Risperidone And Related     hallucinations    Patient Measurements: Height: (!) 5" (12.7 cm) Weight: 189 lb (85.7 kg) IBW/kg (Calculated) : -81   Vital Signs: Temp: 97.7 F (36.5 C) (06/29 1647) Temp Source: Oral (06/29 1647) BP: 154/68 (06/29 1647) Pulse Rate: 92 (06/29 1647) Intake/Output from previous day: No intake/output data recorded. Intake/Output from this shift: No intake/output data recorded.  Labs:  Recent Labs  10/16/16 2002 10/17/16 0822  WBC 11.1*  --   HGB 12.3  --   HCT 38.3  --   PLT 183  --   CREATININE 5.78* 6.53*  MG  --  1.4*  PHOS  --  7.4*   CrCl cannot be calculated (Unknown ideal weight.).  Assessment: 66 yo female in acute renal failure.   Goal of Therapy:  Renally dose adjusted medications  Plan:  Current medications reviewed. No dose adjustment needed at this time.   Pharmacy will continue to follow.   Rocky Morel 10/17/2016,4:51 PM

## 2016-10-17 NOTE — Op Note (Signed)
  OPERATIVE NOTE   PROCEDURE: 1. Insertion of temporary dialysis catheter catheter right femoral approach approach.  PRE-OPERATIVE DIAGNOSIS: Acute on chronic renal insufficiency requiring hemodialysis  POST-OPERATIVE DIAGNOSIS: Same  SURGEON: Katha Cabal M.D.  ANESTHESIA: 1% lidocaine local infiltration  ESTIMATED BLOOD LOSS: Minimal cc  INDICATIONS:   Erika Jacobs is a 66 y.o. female who presents with stage III renal insufficiency who presented to [facility] with acute changes and has likely now progressed to end-stage renal disease. She is requiring immediate dialysis and therefore temporary catheter is being placed. The risks and benefits were reviewed all questions answered patient has agreed to proceed..  DESCRIPTION: After obtaining full informed written consent, the patient was positioned supine. The right groin was prepped and draped in a sterile fashion. Ultrasound was placed in a sterile sleeve. Ultrasound was utilized to identify the right common femoral vein which is noted to be echolucent and compressible indicating patency. Images recorded for the permanent record. Under real-time visualization a Seldinger needle is inserted into the vein and the guidewires advanced without difficulty. Small counterincision was made at the wire insertion site. Dilator is passed over the wire and the temporary dialysis catheter catheter is fed over the wire without difficulty.  All lumens aspirate and flush easily and are packed with heparin saline. Catheter secured to the skin of the right thigh with 2-0 silk. A sterile dressing is applied with Biopatch.  COMPLICATIONS: None  CONDITION: Unchanged  Erika Jacobs Office:  [phone] 10/17/2016, 5:33 PM

## 2016-10-17 NOTE — Consult Note (Signed)
[facility] CONSULT NOTE    Date: 10/17/2016                  Patient Name:  Erika Jacobs  MRN: [id number]  DOB: 07-27-1950  Age / Sex: 66 y.o., female         PCP: Monico Blitz, MD                 Service Requesting Consult: hospitalist                 Reason for Consult: Acute renal failure/CKD stage III            History of Present Illness: Patient is a 66 y.o. female with a PMHx of Osteoarthritis, bipolar 1 disorder, chronic kidney disease stage III baseline EGFR 71, diabetes mellitus type 2, GERD, hyperlipidemia, hypertension, hypothyroidism, paranoia who was admitted to [facility] on 10/17/2016 for evaluation of severe acute renal failure. Prior to being admitted to the inpatient medicine service she was being seen by psychiatry.  She was admitted to the psychiatric service for severe bipolar 1 disorder with psychotic features.  She was having hallucinations and also appeared suicidal.  She was first seen on 10/14/2016. At that point in time creatinine was 1.29 with a calcium of 10.6. 2 days later on 10/16/2016 creatinine was up to 5.7. Today creatinine has risen to 6.53 with a BUN of 81. Serum bicarbonate is also quite low at 12.  Unclear as to whether she's had any occult ingestions, however patient denies the same.    Medications: Outpatient medications: Prescriptions Prior to Admission  Medication Sig Dispense Refill Last Dose  . amLODipine (NORVASC) 2.5 MG tablet Take 2.5 mg by mouth daily.   Past Week at Unknown time  . aspirin EC 81 MG tablet Take 81 mg by mouth daily.   Past Week at Unknown time  . losartan (COZAAR) 50 MG tablet Take 50 mg by mouth daily.    Past Week at Unknown time  . metoprolol succinate (TOPROL-XL) 50 MG 24 hr tablet Take 50 mg by mouth daily.    Past Week at Unknown time  . paliperidone (INVEGA) 3 MG 24 hr tablet Take 3 mg by mouth daily.    Past Week at Unknown time  . pravastatin (PRAVACHOL) 40 MG tablet Take 40 mg by mouth  daily.   Past Week at Unknown time  . TRESIBA FLEXTOUCH 200 UNIT/ML SOPN Inject 34 Units into the skin daily.    Not Taking at Unknown time    Current medications: Current Facility-Administered Medications  Medication Dose Route Frequency Provider Last Rate Last Dose  . 0.9 %  sodium chloride infusion   Intravenous Continuous Theodoro Grist, MD      . acetaminophen (TYLENOL) tablet 650 mg  650 mg Oral Q6H PRN Theodoro Grist, MD       Or  . acetaminophen (TYLENOL) suppository 650 mg  650 mg Rectal Q6H PRN Theodoro Grist, MD      . heparin injection 5,000 Units  5,000 Units Subcutaneous Q8H Vaickute, Rima, MD      . insulin aspart (novoLOG) injection 0-5 Units  0-5 Units Subcutaneous QHS Vaickute, Rima, MD      . insulin aspart (novoLOG) injection 0-9 Units  0-9 Units Subcutaneous TID WC Vaickute, Rima, MD      . insulin aspart (novoLOG) injection 3 Units  3 Units Subcutaneous TID WC Theodoro Grist, MD      . ondansetron (ZOFRAN) tablet  4 mg  4 mg Oral Q6H PRN Theodoro Grist, MD   4 mg at 10/17/16 1205   Or  . ondansetron (ZOFRAN) injection 4 mg  4 mg Intravenous Q6H PRN Vaickute, Rima, MD      . sodium chloride 0.9 % bolus 500 mL  500 mL Intravenous Once Theodoro Grist, MD 500 mL/hr at 10/17/16 1417 500 mL at 10/17/16 1417  . sodium chloride flush (NS) 0.9 % injection 3 mL  3 mL Intravenous Q12H Theodoro Grist, MD          Allergies: Allergies  Allergen Reactions  . Aripiprazole Other (See Comments)    Pt shakes when taking this medication.  Lorayne Bender [Paliperidone]     High doses causes patient to shake  . Lisinopril Other (See Comments)    Kidney   . Risperidone And Related     hallucinations      Past Medical History: Past Medical History:  Diagnosis Date  . Anemia   . Anxiety   . Arthritis   . Bipolar 1 disorder ([place])   . Chronic constipation   . Chronic headache   . CKD (chronic kidney disease)   . Diabetes mellitus without complication ([place])   . Diabetic  polyneuropathy ([place])   . Gastritis and duodenitis   . GERD (gastroesophageal reflux disease)   . Hypercholesteremia   . Hypertension   . Hypothyroid   . Overactive bladder   . Panic attack   . Paranoia ([place])   . Psychosis   . Shortness of breath   . Thought disorder      Past Surgical History: Past Surgical History:  Procedure Laterality Date  . CHOLECYSTECTOMY N/A 06/02/2013   Procedure: LAPAROSCOPIC CHOLECYSTECTOMY;  Surgeon: Scherry Ran, MD;  Location: [facility] ORS;  Service: General;  Laterality: N/A;  . COLONOSCOPY WITH ESOPHAGOGASTRODUODENOSCOPY (EGD) N/A 05/20/2013   Procedure: COLONOSCOPY WITH ESOPHAGOGASTRODUODENOSCOPY (EGD);  Surgeon: Rogene Houston, MD;  Location: [facility] END[facility] SUITE;  Service: Endoscopy;  Laterality: N/A;  730  . EYE SURGERY Left   . TUBAL LIGATION       Family History: No family history on file.   Social History: Social History   Social History  . Marital status: Divorced    Spouse name: N/A  . Number of children: N/A  . Years of education: N/A   Occupational History  . Not on file.   Social History Main Topics  . Smoking status: Former Smoker    Packs/day: 2.00    Years: 30.00    Types: Cigarettes    Start date: 10/18/1967    Quit date: 05/20/2002  . Smokeless tobacco: Never Used  . Alcohol use No  . Drug use: No  . Sexual activity: Yes    Birth control/ protection: Post-menopausal   Other Topics Concern  . Not on file   Social History Narrative  . No narrative on file     Review of Systems: Review of Systems  Constitutional: Negative for chills, diaphoresis and fever.  HENT: Negative for ear pain, hearing loss and tinnitus.   Eyes: Negative for blurred vision and double vision.  Respiratory: Negative for cough, hemoptysis and sputum production.   Cardiovascular: Negative for chest pain, palpitations and orthopnea.  Gastrointestinal: Positive for nausea. Negative for heartburn.  Genitourinary: Negative for dysuria,  frequency and urgency.  Musculoskeletal: Negative for joint pain and myalgias.  Skin: Negative for itching and rash.  Neurological: Negative for dizziness and focal weakness.  Endo/Heme/Allergies: Negative for polydipsia.  Does not bruise/bleed easily.  Psychiatric/Behavioral: Positive for depression, hallucinations and suicidal ideas. The patient is nervous/anxious.      Vital Signs: There were no vitals taken for this visit.  Weight trends: There were no vitals filed for this visit.  Physical Exam: General: NAD, sitting up in bed  Head: Normocephalic, atraumatic.  Eyes: Anicteric, EOMI  Nose: Mucous membranes moist, not inflammed, nonerythematous.  Throat: Oropharynx nonerythematous, no exudate appreciated.   Neck: Supple, trachea midline.  Lungs:  Normal respiratory effort. Clear to auscultation BL without crackles or wheezes.  Heart: RRR. S1 and S2 normal without gallop, murmur, or rubs.  Abdomen:  BS normoactive. Soft, Nondistended, non-tender.  No masses or organomegaly.  Extremities: No pretibial edema.  Neurologic: Awake, alert, pleasant, conversant  Skin: No visible rashes, scars.    Lab results: Basic Metabolic Panel:  Recent Labs Lab 10/14/16 1448 10/16/16 2002 10/17/16 0822  NA 136 127* 123*  K 4.7 5.0 4.8  CL 103 96* 90*  CO2 23 15* 12*  GLUCOSE 197* 114* 109*  BUN 29* 73* 81*  CREATININE 1.29* 5.78* 6.53*  CALCIUM 10.6* 8.9 8.3*  MG  --   --  1.4*  PHOS  --   --  7.4*    Liver Function Tests:  Recent Labs Lab 10/14/16 1448  AST 22  ALT 24  ALKPHOS 52  BILITOT 0.6  PROT 8.7*  ALBUMIN 4.6   No results for input(s): LIPASE, AMYLASE in the last 168 hours. No results for input(s): AMMONIA in the last 168 hours.  CBC:  Recent Labs Lab 10/14/16 1448 10/16/16 2002  WBC 8.8 11.1*  HGB 13.4 12.3  HCT 41.0 38.3  MCV 80.3 82.3  PLT 238 183    Cardiac Enzymes:  Recent Labs Lab 10/17/16 0822  TROPONINI <0.03    BNP: Invalid  input(s): POCBNP  CBG:  Recent Labs Lab 10/17/16 0637 10/17/16 0651 10/17/16 0658 10/17/16 0719 10/17/16 0734  GLUCAP 29* 42* 51* 64* 87    Microbiology: No results found for this or any previous visit.  Coagulation Studies:  Recent Labs  10/17/16 1326  LABPROT 13.3  INR 1.01    Urinalysis: No results for input(s): COLORURINE, LABSPEC, PHURINE, GLUCOSEU, HGBUR, BILIRUBINUR, KETONESUR, PROTEINUR, UROBILINOGEN, NITRITE, LEUKOCYTESUR in the last 72 hours.  Invalid input(s): APPERANCEUR    Imaging:  No results found.   Assessment & Plan: Pt is a 66 y.o. female with a PMHx of Osteoarthritis, bipolar 1 disorder, chronic kidney disease stage III baseline EGFR 51, diabetes mellitus type 2, GERD, hyperlipidemia, hypertension, hypothyroidism, paranoia who was admitted to [facility] on 10/17/2016 for evaluation of severe acute renal failure. Prior to being admitted to the inpatient medicine service she was being seen by psychiatry.  She was admitted to the psychiatric service for severe bipolar 1 disorder with psychotic features.   1. Severe acute renal failure, etiology currently unclear. 2. Metabolic acidosis. 3. Hyponatremia.  Plan:  The patient presents now with severe acute renal failure. The etiology of this is currently unclear. We will proceed with additional workup including serum volatile screen, salicylate level, CK, C3, C4, ANA, ANCA antibodies, GBM antibodies, SPEP, UPEP, and renal ultrasound. Her renal function is quite low at the moment none she has associated acidosis. As such we will proceed with temporary dialysis. We will request vascular surgery to place a temporary dialysis catheter at the bedside. We will then plan for a short dialysis treatment today. She may need additional treatment tomorrow as well. Further  plan to be determined once labs and studies from above are available. Thanks for consultation.

## 2016-10-17 NOTE — Progress Notes (Signed)
Informed Mandy of bed assignment

## 2016-10-17 NOTE — Progress Notes (Signed)
0630: Patient came to the vital signs room and reported that she was feeling dizzy. CBG checked: 33, rechecked, 29. Offered orange juice and crackers. CBG 41, then 51. CBG rechecked just now: 47. Report given to incoming nurse.

## 2016-10-17 NOTE — Progress Notes (Signed)
POST DIALYSIS ASSESSMENT 

## 2016-10-17 NOTE — Progress Notes (Signed)
Attempted call to give report to [place], Therapist, sports. Secretary reports he is involved in emergency at this time. Informed Network engineer to have Mia Creek, [place] call report to [place], RN-BMU at [phone]. Awaiting call back.

## 2016-10-17 NOTE — Consult Note (Signed)
[facility] Vascular Consult Note  MRN : [id number]  Erika Jacobs is a 66 y.o. ([birth year]) female who presents with chief complaint of No chief complaint on file.  History of Present Illness:  The patient is a 66 year old female with a past medical history of osteoarthritis, bipolar 1 disorder, chronic kidney disease stage III baseline GFR 51, diabetes mellitus type 2, GERD, hyperlipidemia, hypertension  admitted to [facility] for increased stress, anxiety, paranoia, hallucinations and suicidal ideations. Patient endorses a history of being told she has progressively worsening kidney function and she may need dialysis in the future. Out of fear, the patient stopped many of her medications believing that they were hurting her kidneys. Patient informs that she was unable to make urine. Patient was at first admitted to psychiatry and she was discharged from their care and is now admitted to a regular medicine floor in acute renal failure. Patient's most recent BUN and creatinine is: 81 / 6.53 Patient denies any shortness of breath or chest pain. Patient denies any fever, nausea or vomiting.   Consult it by Dr Holley Raring to place a temporary dialysis catheter so the patient may dialyze.  Current Facility-Administered Medications  Medication Dose Route Frequency Provider Last Rate Last Dose  . 0.9 %  sodium chloride infusion   Intravenous Continuous Theodoro Grist, MD 100 mL/hr at 10/17/16 1453    . acetaminophen (TYLENOL) tablet 650 mg  650 mg Oral Q6H PRN Theodoro Grist, MD       Or  . acetaminophen (TYLENOL) suppository 650 mg  650 mg Rectal Q6H PRN Theodoro Grist, MD      . heparin injection 5,000 Units  5,000 Units Subcutaneous Q8H Theodoro Grist, MD   5,000 Units at 10/17/16 1504  . insulin aspart (novoLOG) injection 0-5 Units  0-5 Units Subcutaneous QHS Vaickute, Rima, MD      . insulin aspart (novoLOG) injection 0-9 Units  0-9  Units Subcutaneous TID WC Theodoro Grist, MD   2 Units at 10/17/16 1506  . insulin aspart (novoLOG) injection 3 Units  3 Units Subcutaneous TID WC Theodoro Grist, MD   3 Units at 10/17/16 1505  . ondansetron (ZOFRAN) tablet 4 mg  4 mg Oral Q6H PRN Theodoro Grist, MD   4 mg at 10/17/16 1205   Or  . ondansetron (ZOFRAN) injection 4 mg  4 mg Intravenous Q6H PRN Theodoro Grist, MD      . sodium chloride flush (NS) 0.9 % injection 3 mL  3 mL Intravenous Q12H Theodoro Grist, MD   3 mL at 10/17/16 1458   Past Medical History:  Diagnosis Date  . Anemia   . Anxiety   . Arthritis   . Bipolar 1 disorder ([place])   . Chronic constipation   . Chronic headache   . CKD (chronic kidney disease)   . Diabetes mellitus without complication ([place])   . Diabetic polyneuropathy ([place])   . Gastritis and duodenitis   . GERD (gastroesophageal reflux disease)   . Hypercholesteremia   . Hypertension   . Hypothyroid   . Overactive bladder   . Panic attack   . Paranoia ([place])   . Psychosis   . Shortness of breath   . Thought disorder    Past Surgical History:  Procedure Laterality Date  . CHOLECYSTECTOMY N/A 06/02/2013   Procedure: LAPAROSCOPIC CHOLECYSTECTOMY;  Surgeon: Scherry Ran, MD;  Location: [facility] ORS;  Service: General;  Laterality: N/A;  . COLONOSCOPY WITH ESOPHAGOGASTRODUODENOSCOPY (  EGD) N/A 05/20/2013   Procedure: COLONOSCOPY WITH ESOPHAGOGASTRODUODENOSCOPY (EGD);  Surgeon: Rogene Houston, MD;  Location: [facility];  Service: Endoscopy;  Laterality: N/A;  730  . EYE SURGERY Left   . TUBAL LIGATION     Social History Social History  Substance Use Topics  . Smoking status: Former Smoker    Packs/day: 2.00    Years: 30.00    Types: Cigarettes    Start date: 10/18/1967    Quit date: 05/20/2002  . Smokeless tobacco: Never Used  . Alcohol use No   Family History No family history on file.  Patient denies any peripheral artery, venous or renal disease.  Allergies  Allergen Reactions  .  Aripiprazole Other (See Comments)    Pt shakes when taking this medication.  Lorayne Bender [Paliperidone]     High doses causes patient to shake  . Lisinopril Other (See Comments)    Kidney   . Risperidone And Related     hallucinations   REVIEW OF SYSTEMS (Negative unless checked)  Constitutional: [] Weight loss  [] Fever  [] Chills Cardiac: [] Chest pain   [] Chest pressure   [] Palpitations   [] Shortness of breath when laying flat   [] Shortness of breath at rest   [] Shortness of breath with exertion. Vascular:  [] Pain in legs with walking   [] Pain in legs at rest   [] Pain in legs when laying flat   [] Claudication   [] Pain in feet when walking  [] Pain in feet at rest  [] Pain in feet when laying flat   [] History of DVT   [] Phlebitis   [] Swelling in legs   [] Varicose veins   [] Non-healing ulcers Pulmonary:   [] Uses home oxygen   [] Productive cough   [] Hemoptysis   [] Wheeze  [] COPD   [] Asthma Neurologic:  [] Dizziness  [] Blackouts   [] Seizures   [] History of stroke   [] History of TIA  [] Aphasia   [] Temporary blindness   [] Dysphagia   [] Weakness or numbness in arms   [] Weakness or numbness in legs Musculoskeletal:  [] Arthritis   [] Joint swelling   [] Joint pain   [] Low back pain Hematologic:  [] Easy bruising  [] Easy bleeding   [] Hypercoagulable state   [] Anemic  [] Hepatitis Gastrointestinal:  [] Blood in stool   [] Vomiting blood  [] Gastroesophageal reflux/heartburn   [] Difficulty swallowing. Genitourinary:  [x] Chronic kidney disease   [x] Difficult urination  [] Frequent urination  [] Burning with urination   [] Blood in urine Skin:  [] Rashes   [] Ulcers   [] Wounds Psychological:  [x] History of anxiety   [x]  History of major depression.  Physical Examination  There were no vitals filed for this visit. There is no height or weight on file to calculate BMI. Gen:  WD/WN, NAD Head: [place]/AT, No temporalis wasting. Prominent temp pulse not noted. Ear/Nose/Throat: Hearing grossly intact, nares w/o erythema or  drainage, oropharynx w/o Erythema/Exudate Eyes: Sclera non-icteric, conjunctiva clear Neck: Trachea midline.  No JVD.  Pulmonary:  Good air movement, respirations not labored, equal bilaterally.  Cardiac: RRR, normal S1, S2. Vascular:  Vessel Right Left  Radial Palpable Palpable  Ulnar Palpable Palpable  Brachial Palpable Palpable  Carotid Palpable, without bruit Palpable, without bruit  Aorta Not palpable N/A  Femoral Palpable Palpable  Popliteal Palpable Palpable  PT Palpable Palpable  DP Palpable Palpable   Gastrointestinal: soft, non-tender/non-distended. No guarding/reflex.  Musculoskeletal: M/S 5/5 throughout.  Extremities without ischemic changes.  No deformity or atrophy. No edema. Neurologic: Sensation grossly intact in extremities.  Symmetrical.  Speech is fluent. Motor exam as listed  above. Psychiatric: Judgment intact, Mood & affect appropriate for pt's clinical situation. Dermatologic: No rashes or ulcers noted.  No cellulitis or open wounds. Lymph : No Cervical, Axillary, or Inguinal lymphadenopathy.  CBC Lab Results  Component Value Date   WBC 11.1 (H) 10/16/2016   HGB 12.3 10/16/2016   HCT 38.3 10/16/2016   MCV 82.3 10/16/2016   PLT 183 10/16/2016   BMET    Component Value Date/Time   NA 123 (L) 10/17/2016 0822   K 4.8 10/17/2016 0822   CL 90 (L) 10/17/2016 0822   CO2 12 (L) 10/17/2016 0822   GLUCOSE 109 (H) 10/17/2016 0822   BUN 81 (H) 10/17/2016 0822   CREATININE 6.53 (H) 10/17/2016 0822   CALCIUM 8.3 (L) 10/17/2016 0822   GFRNONAA 6 (L) 10/17/2016 0822   GFRAA 7 (L) 10/17/2016 5681   Estimated Creatinine Clearance: 8.5 mL/min (A) (by C-G formula based on SCr of 6.53 mg/dL (H)).  COAG Lab Results  Component Value Date   INR 1.01 10/17/2016   Radiology No results found.  Assessment/Plan The patient is a 66 year old female with a past medical history of osteoarthritis, bipolar 1 disorder, chronic kidney disease stage III baseline GFR 51,  diabetes mellitus type 2, GERD, hyperlipidemia, hypertension  admitted to [facility] in acute on chronic renal failure in need of a dialysis access in order to dialyze. 1. Acute on chronic renal failure: Patient admitted to [facility] in acute on chronic renal failure needing dialysis access in order to dialyze. We'll place a temporary dialysis catheter in order the patient to dialyze. If patient needs more of a permanent access can place a permacath. Procedure, risks and benefits explained to the patient. All questions answered. Patient wishes to proceed. 2. Diabetes mellitus 2: Encouraged good control as its slows the progression of atherosclerotic disease. Patient had stopped taking most of her diabetic medication due to fear it was injuring her kidneys. She is now restarted on the appropriate medication. 3. Hypertension: Encouraged good control as its slows the progression of atherosclerotic disease. Patient had stopped taking her anti-hypertensive medications due to fear it was hurting her kidneys. Now that she is inpatient she is back on the appropriate medication. 4. Hyperlipidemia: Encouraged good control as its slows the progression of atherosclerotic disease. Patient had stopped her hyper lipidemia medications due to fear it was hurting her kidneys. Now that she is inpatient she is on the appropriate medications.  Discussed with Dr. Francene Castle, PA-C  10/17/2016 3:32 PM

## 2016-10-17 NOTE — Progress Notes (Signed)
Pt arrived to floor at 1645 from Ultrasound without sitter. Mia Creek RN called on 2 A to inquire on patient sitter location 1700. Sitter at bedside 1705

## 2016-10-17 NOTE — BHH Suicide Risk Assessment (Addendum)
[facility] Discharge Suicide Risk Assessment   Principal Problem: Bipolar I disorder, severe, current or most recent episode depressed, with psychotic features [facility]) Discharge Diagnoses:  Patient Active Problem List   Diagnosis Date Noted  . Bipolar I disorder, severe, current or most recent episode depressed, with psychotic features ([place]) [F31.5] 10/20/2014  . Dyslipidemia [E78.5] 10/18/2014  . GERD (gastroesophageal reflux disease) [K21.9] 10/18/2014  . Hypothyroidism [E03.9] 10/18/2014  . Diabetes ([place]) [E11.9] 10/17/2014  . Hypertension [I10] 10/17/2014  . Urinary incontinence [R32] 10/17/2014  . Suicidal ideation [R45.851]   . Cholecystitis with cholelithiasis [K80.10] 06/02/2013    Total Time spent with patient: 30 minutes  Musculoskeletal: Strength & Muscle Tone: within normal limits Gait & Station: normal Patient leans: N/A  Psychiatric Specialty Exam: Review of Systems  Gastrointestinal: Positive for nausea and vomiting.  Neurological: Positive for dizziness.  Psychiatric/Behavioral: The patient is nervous/anxious.   All other systems reviewed and are negative.   Blood pressure 103/79, pulse (!) 130, temperature 97.8 F (36.6 C), temperature source Oral, resp. rate 18, height 5' (1.524 m), weight 85.7 kg (189 lb), SpO2 98 %.Body mass index is 36.91 kg/m.  General Appearance: Casual  Eye Contact::  Good  Speech:  Clear and Coherent409  Volume:  Normal  Mood:  Anxious  Affect:  Appropriate  Thought Process:  Goal Directed and Descriptions of Associations: Tangential  Orientation:  Full (Time, Place, and Person)  Thought Content:  WDL  Suicidal Thoughts:  No  Homicidal Thoughts:  No  Memory:  Immediate;   Fair Recent;   Fair Remote;   Fair  Judgement:  Fair  Insight:  Fair  Psychomotor Activity:  Normal  Concentration:  Fair  Recall:  [employer] of Knowledge:Fair  Language: Fair  Akathisia:  No  Handed:  Right  AIMS (if indicated):     Assets:  Communication  Skills Desire for Improvement Financial Resources/Insurance Housing Resilience Social Support  Sleep:  Number of Hours: 5.5  Cognition: WNL  ADL's:  Intact   Mental Status Per Nursing Assessment::   On Admission:     Demographic Factors:  Divorced or widowed, Caucasian and Living alone  Loss Factors: Loss of significant relationship and Decline in physical health  Historical Factors: Prior suicide attempts, Family history of mental illness or substance abuse and Impulsivity  Risk Reduction Factors:   Sense of responsibility to family, Positive social support and Positive therapeutic relationship  Continued Clinical Symptoms:  Bipolar Disorder:   Depressive phase Depression:   Insomnia Medical Diagnoses and Treatments/Surgeries  Cognitive Features That Contribute To Risk:  None    Suicide Risk:  Minimal: No identifiable suicidal ideation.  Patients presenting with no risk factors but with morbid ruminations; may be classified as minimal risk based on the severity of the depressive symptoms    Plan Of Care/Follow-up recommendations:  Activity:  as tolerated. Diet:  low sodium heart healthy.ADA diet. Other:  keep follow up appointments.  Orson Slick, MD 10/17/2016, 7:56 AM

## 2016-10-17 NOTE — Progress Notes (Signed)
HD STARTED  

## 2016-10-17 NOTE — Progress Notes (Signed)
PRE DIALYSIS ASSESSMENT 

## 2016-10-17 NOTE — Progress Notes (Signed)
5686: Pt sitting up in bed, alert and oriented. States, "can someone sit with me? I'm not well, and my blood sugar is low."  CBG rechecked, 87. Pt spoke with family member on the phone and reported CBG result to family member. Pt went back to her room to lie down, door open and pt assured staff can see her. Oriented to call light for safety. Support and encouragement provided. Safety maintained. Will continue to monitor.   Spoke with Dr. Bary Leriche via telephone, informed of transfer to medical floor by hospitalist. Verbalized understanding, awaiting discharge/readmit orders.

## 2016-10-17 NOTE — Progress Notes (Signed)
Troponin level of 0.05, MD notified via text page. Will continue to monitor.

## 2016-10-18 DIAGNOSIS — F315 Bipolar disorder, current episode depressed, severe, with psychotic features: Secondary | ICD-10-CM

## 2016-10-18 LAB — PHOSPHORUS: PHOSPHORUS: 4.6 mg/dL (ref 2.5–4.6)

## 2016-10-18 LAB — BASIC METABOLIC PANEL
ANION GAP: 8 (ref 5–15)
BUN: 61 mg/dL — AB (ref 6–20)
CHLORIDE: 94 mmol/L — AB (ref 101–111)
CO2: 22 mmol/L (ref 22–32)
Calcium: 7.4 mg/dL — ABNORMAL LOW (ref 8.9–10.3)
Creatinine, Ser: 5.5 mg/dL — ABNORMAL HIGH (ref 0.44–1.00)
GFR calc Af Amer: 9 mL/min — ABNORMAL LOW (ref >60)
GFR calc non Af Amer: 7 mL/min — ABNORMAL LOW (ref >60)
Glucose, Bld: 75 mg/dL (ref 65–99)
POTASSIUM: 4.4 mmol/L (ref 3.5–5.1)
Sodium: 124 mmol/L — ABNORMAL LOW (ref 135–145)

## 2016-10-18 LAB — PROTEIN / CREATININE RATIO, URINE
CREATININE, URINE: 16 mg/dL
PROTEIN CREATININE RATIO: 2.19 mg/mg{creat} — AB (ref 0.00–0.15)
Total Protein, Urine: 35 mg/dL

## 2016-10-18 LAB — GLUCOSE, CAPILLARY
GLUCOSE-CAPILLARY: 57 mg/dL — AB (ref 65–99)
Glucose-Capillary: 83 mg/dL (ref 65–99)
Glucose-Capillary: 123 mg/dL — ABNORMAL HIGH (ref 65–99)
Glucose-Capillary: 169 mg/dL — ABNORMAL HIGH (ref 65–99)

## 2016-10-18 LAB — URINALYSIS, COMPLETE (UACMP) WITH MICROSCOPIC
BILIRUBIN URINE: NEGATIVE
Glucose, UA: NEGATIVE mg/dL
KETONES UR: NEGATIVE mg/dL
NITRITE: NEGATIVE
PROTEIN: NEGATIVE mg/dL
Specific Gravity, Urine: 1.003 — ABNORMAL LOW (ref 1.005–1.030)
pH: 7 (ref 5.0–8.0)

## 2016-10-18 LAB — HEMOGLOBIN A1C
Hgb A1c MFr Bld: 7.3 % — ABNORMAL HIGH (ref 4.8–5.6)
Mean Plasma Glucose: 163 mg/dL

## 2016-10-18 LAB — CBC
HEMATOCRIT: 28.6 % — AB (ref 35.0–47.0)
HEMOGLOBIN: 9.7 g/dL — AB (ref 12.0–16.0)
MCH: 26.5 pg (ref 26.0–34.0)
MCHC: 33.9 g/dL (ref 32.0–36.0)
MCV: 78.1 fL — AB (ref 80.0–100.0)
Platelets: 142 10*3/uL — ABNORMAL LOW (ref 150–440)
RBC: 3.66 MIL/uL — ABNORMAL LOW (ref 3.80–5.20)
RDW: 15.4 % — AB (ref 11.5–14.5)
WBC: 6.8 10*3/uL (ref 3.6–11.0)

## 2016-10-18 LAB — TROPONIN I: Troponin I: 0.04 ng/mL (ref <0.03)

## 2016-10-18 LAB — MAGNESIUM: Magnesium: 2.7 mg/dL — ABNORMAL HIGH (ref 1.7–2.4)

## 2016-10-18 MED ORDER — VENLAFAXINE HCL ER 75 MG PO CP24
75.0000 mg | ORAL_CAPSULE | Freq: Every day | ORAL | Status: DC
  Administered 2016-10-19 – 2016-10-21 (×3): 75 mg via ORAL
  Filled 2016-10-18 (×3): qty 1

## 2016-10-18 NOTE — Plan of Care (Signed)
Problem: Fluid Volume: Goal: Fluid volume balance will be maintained or improved Outcome: Progressing Temp hemodialysis / R groin triple lumen temp cath

## 2016-10-18 NOTE — Progress Notes (Signed)
PRE DIALYSIS ASSESSMENT 

## 2016-10-18 NOTE — Consult Note (Signed)
[facility] Consult   Reason for Consult:  Consult for 66 year old woman transferred to the medical service from psychiatry Referring Physician:  Ether Griffins Patient Identification: Erika Jacobs MRN:  [id number] Principal Diagnosis: Bipolar I disorder, severe, current or most recent episode depressed, with psychotic features [facility]) Diagnosis:   Patient Active Problem List   Diagnosis Date Noted  . UTI (urinary tract infection) [N39.0] 10/17/2016  . CKD (chronic kidney disease), stage III [N18.3] 10/17/2016  . Acute renal failure (ARF) ([place]) [N17.9] 10/17/2016  . Hypotension [I95.9] 10/17/2016  . Tachycardia [R00.0] 10/17/2016  . Leukocytosis [D72.829] 10/17/2016  . Bipolar I disorder, severe, current or most recent episode depressed, with psychotic features ([place]) [F31.5] 10/20/2014  . Dyslipidemia [E78.5] 10/18/2014  . GERD (gastroesophageal reflux disease) [K21.9] 10/18/2014  . Hypothyroidism [E03.9] 10/18/2014  . Diabetes ([place]) [E11.9] 10/17/2014  . Hypertension [I10] 10/17/2014  . Urinary incontinence [R32] 10/17/2014  . Suicidal ideation [R45.851]   . Cholecystitis with cholelithiasis [K80.10] 06/02/2013    Total Time spent with patient: 1 hour  Subjective:   Erika Jacobs is a 66 y.o. female patient admitted with "I'm feeling a lot better today".  HPI:  Patient seen chart reviewed. 66 year old woman who was admitted to the psychiatric service but transferred soon thereafter to the medical service because of worsening renal failure. Since being on the medical service she has had acute dialysis and is in discussion with nephrology about her plan going forward. She was admitted to the psychiatric service because of complaints that were thought to be part of mania or depression with psychosis. Patient tells me today that she had been feeling both physically and mentally run down and sick for many weeks. She says the physical was the bigger part than the mental  although when she gets sick she feels more depressed. She had been having trouble sleeping her energy of been poor. She denies however that she had had any suicidal thoughts and she denies that she had been having any hallucinations. Certainly today she denies any hallucinations. Says she is having no thoughts about wanting to hurt her self at all. Mood is feeling more optimistic and upbeat. She had been continuing to take [facility] as an outpatient which had been gradually being tapered by her outpatient psychiatrist. It does not seem like she had been on an antidepressant recently.  Social history: Patient lives alone. She has relatives however who are attentive and had assisted her in coming into the hospital.  Medical history: Has had some problems with her kidneys brewing for quite a while. History of diabetes. Multiple medical problems.  Substance abuse history: Denies alcohol or drug abuse  Past Psychiatric History: Past history of a diagnosis of bipolar disorder with psychotic symptoms that was being treated with an antipsychotic. She has a regular outpatient psychiatrist. Her antipsychotic was being gradually tapered. Denies any past history of suicide attempts  Risk to Self: Is patient at risk for suicide?: Yes Risk to Others:   Prior Inpatient Therapy:   Prior Outpatient Therapy:    Past Medical History:  Past Medical History:  Diagnosis Date  . Anemia   . Anxiety   . Arthritis   . Bipolar 1 disorder ([place])   . Chronic constipation   . Chronic headache   . CKD (chronic kidney disease)   . Diabetes mellitus without complication ([place])   . Diabetic polyneuropathy ([place])   . Gastritis and duodenitis   . GERD (gastroesophageal reflux disease)   . Hypercholesteremia   .  Hypertension   . Hypothyroid   . Overactive bladder   . Panic attack   . Paranoia ([place])   . Psychosis   . Shortness of breath   . Thought disorder     Past Surgical History:  Procedure Laterality Date  .  CHOLECYSTECTOMY N/A 06/02/2013   Procedure: LAPAROSCOPIC CHOLECYSTECTOMY;  Surgeon: Scherry Ran, MD;  Location: [facility] ORS;  Service: General;  Laterality: N/A;  . COLONOSCOPY WITH ESOPHAGOGASTRODUODENOSCOPY (EGD) N/A 05/20/2013   Procedure: COLONOSCOPY WITH ESOPHAGOGASTRODUODENOSCOPY (EGD);  Surgeon: Rogene Houston, MD;  Location: [facility] END[facility] SUITE;  Service: Endoscopy;  Laterality: N/A;  730  . EYE SURGERY Left   . TUBAL LIGATION     Family History: History reviewed. No pertinent family history. Family Psychiatric  History: Denies any Social History:  History  Alcohol Use No     History  Drug Use No    Social History   Social History  . Marital status: Divorced    Spouse name: N/A  . Number of children: N/A  . Years of education: N/A   Social History Main Topics  . Smoking status: Former Smoker    Packs/day: 2.00    Years: 30.00    Types: Cigarettes    Start date: 10/18/1967    Quit date: 05/20/2002  . Smokeless tobacco: Never Used  . Alcohol use No  . Drug use: No  . Sexual activity: Yes    Birth control/ protection: Post-menopausal   Other Topics Concern  . None   Social History Narrative  . None   Additional Social History:    Allergies:   Allergies  Allergen Reactions  . Aripiprazole Other (See Comments)    Pt shakes when taking this medication.  Lorayne Bender [Paliperidone]     High doses causes patient to shake  . Lisinopril Other (See Comments)    Kidney   . Risperidone And Related     hallucinations    Labs:  Results for orders placed or performed during the hospital encounter of 10/17/16 (from the past 48 hour(s))  Hemoglobin A1c     Status: Abnormal   Collection Time: 10/17/16  8:22 AM  Result Value Ref Range   Hgb A1c MFr Bld 7.3 (H) 4.8 - 5.6 %    Comment: (NOTE)         Pre-diabetes: 5.7 - 6.4         Diabetes: >6.4         Glycemic control for adults with diabetes: <7.0    Mean Plasma Glucose 163 mg/dL    Comment: (NOTE) Performed At: [facility] [place], [place] [id number] Lindon Romp MD TJ:[id number]   Rapid HIV screen (HIV 1/2 Ab+Ag)     Status: None   Collection Time: 10/17/16  8:22 AM  Result Value Ref Range   HIV-1 P24 Antigen - HIV24 NON REACTIVE NON REACTIVE   HIV 1/2 Antibodies NON REACTIVE NON REACTIVE   Interpretation (HIV Ag Ab)      A non reactive test result means that HIV 1 or HIV 2 antibodies and HIV 1 p24 antigen were not detected in the specimen.  Magnesium     Status: Abnormal   Collection Time: 10/17/16  8:22 AM  Result Value Ref Range   Magnesium 1.4 (L) 1.7 - 2.4 mg/dL  Phosphorus     Status: Abnormal   Collection Time: 10/17/16  8:22 AM  Result Value Ref Range   Phosphorus 7.4 (H) 2.5 -  4.6 mg/dL  Troponin I     Status: None   Collection Time: 10/17/16  8:22 AM  Result Value Ref Range   Troponin I <0.03 <0.03 ng/mL  TSH     Status: None   Collection Time: 10/17/16  8:22 AM  Result Value Ref Range   TSH 1.540 0.350 - 4.500 uIU/mL    Comment: Performed by a 3rd Generation assay with a functional sensitivity of <=0.01 uIU/mL.  Lactic acid, plasma     Status: Abnormal   Collection Time: 10/17/16 10:37 AM  Result Value Ref Range   Lactic Acid, Venous 4.5 (HH) 0.5 - 1.9 mmol/L    Comment: CRITICAL RESULT CALLED TO, READ BACK BY AND VERIFIED WITH LANCE LANSANE AT 1143 10/17/16 DAS   Glucose, capillary     Status: Abnormal   Collection Time: 10/17/16 10:59 AM  Result Value Ref Range   Glucose-Capillary 159 (H) 65 - 99 mg/dL  Lactic acid, plasma     Status: Abnormal   Collection Time: 10/17/16  1:26 PM  Result Value Ref Range   Lactic Acid, Venous 3.8 (HH) 0.5 - 1.9 mmol/L    Comment: CRITICAL RESULT CALLED TO, READ BACK BY AND VERIFIED WITH LANCE LANSANE AT 1829 ON 10/17/16 ALV   Protime-INR     Status: None   Collection Time: 10/17/16  1:26 PM  Result Value Ref Range   Prothrombin Time 13.3 11.4 - 15.2 seconds   INR 1.01   Glucose, capillary     Status:  Abnormal   Collection Time: 10/17/16  2:59 PM  Result Value Ref Range   Glucose-Capillary 186 (H) 65 - 99 mg/dL  Glucose, capillary     Status: Abnormal   Collection Time: 10/17/16  5:58 PM  Result Value Ref Range   Glucose-Capillary 107 (H) 65 - 99 mg/dL  Troponin I     Status: Abnormal   Collection Time: 10/17/16  7:27 PM  Result Value Ref Range   Troponin I 0.05 (HH) <0.03 ng/mL    Comment: CRITICAL RESULT CALLED TO, READ BACK BY AND VERIFIED WITH TAKERIA NESBITT AT 2236 10/17/16.PMH  Salicylate level     Status: None   Collection Time: 10/17/16  7:27 PM  Result Value Ref Range   Salicylate Lvl <9.3 2.8 - 30.0 mg/dL  CK     Status: None   Collection Time: 10/17/16  7:27 PM  Result Value Ref Range   Total CK 106 38 - 234 U/L  Glucose, capillary     Status: Abnormal   Collection Time: 10/17/16 10:18 PM  Result Value Ref Range   Glucose-Capillary 63 (L) 65 - 99 mg/dL  Glucose, capillary     Status: None   Collection Time: 10/17/16 10:39 PM  Result Value Ref Range   Glucose-Capillary 69 65 - 99 mg/dL  Glucose, capillary     Status: None   Collection Time: 10/17/16 10:55 PM  Result Value Ref Range   Glucose-Capillary 83 65 - 99 mg/dL  Troponin I     Status: Abnormal   Collection Time: 10/18/16 12:05 AM  Result Value Ref Range   Troponin I 0.04 (HH) <0.03 ng/mL    Comment: CRITICAL VALUE NOTED. VALUE IS CONSISTENT WITH PREVIOUSLY REPORTED/CALLED VALUE.PMH  Basic metabolic panel     Status: Abnormal   Collection Time: 10/18/16  4:36 AM  Result Value Ref Range   Sodium 124 (L) 135 - 145 mmol/L   Potassium 4.4 3.5 - 5.1 mmol/L   Chloride 94 (  L) 101 - 111 mmol/L   CO2 22 22 - 32 mmol/L   Glucose, Bld 75 65 - 99 mg/dL   BUN 61 (H) 6 - 20 mg/dL   Creatinine, Ser 5.50 (H) 0.44 - 1.00 mg/dL   Calcium 7.4 (L) 8.9 - 10.3 mg/dL   GFR calc non Af Amer 7 (L) >60 mL/min   GFR calc Af Amer 9 (L) >60 mL/min    Comment: (NOTE) The eGFR has been calculated using the CKD EPI  equation. This calculation has not been validated in all clinical situations. eGFR's persistently <60 mL/min signify possible Chronic Kidney Disease.    Anion gap 8 5 - 15  CBC     Status: Abnormal   Collection Time: 10/18/16  4:36 AM  Result Value Ref Range   WBC 6.8 3.6 - 11.0 K/uL   RBC 3.66 (L) 3.80 - 5.20 MIL/uL   Hemoglobin 9.7 (L) 12.0 - 16.0 g/dL    Comment: RESULT REPEATED AND VERIFIED   HCT 28.6 (L) 35.0 - 47.0 %   MCV 78.1 (L) 80.0 - 100.0 fL   MCH 26.5 26.0 - 34.0 pg   MCHC 33.9 32.0 - 36.0 g/dL   RDW 15.4 (H) 11.5 - 14.5 %   Platelets 142 (L) 150 - 440 K/uL  Magnesium     Status: Abnormal   Collection Time: 10/18/16  4:36 AM  Result Value Ref Range   Magnesium 2.7 (H) 1.7 - 2.4 mg/dL  Phosphorus     Status: None   Collection Time: 10/18/16  4:36 AM  Result Value Ref Range   Phosphorus 4.6 2.5 - 4.6 mg/dL  Glucose, capillary     Status: Abnormal   Collection Time: 10/18/16  7:37 AM  Result Value Ref Range   Glucose-Capillary 57 (L) 65 - 99 mg/dL  Glucose, capillary     Status: None   Collection Time: 10/18/16  7:51 AM  Result Value Ref Range   Glucose-Capillary 83 65 - 99 mg/dL    Current Facility-Administered Medications  Medication Dose Route Frequency Provider Last Rate Last Dose  . 0.9 %  sodium chloride infusion   Intravenous Continuous Theodoro Grist, MD 100 mL/hr at 10/18/16 0038    . acetaminophen (TYLENOL) tablet 650 mg  650 mg Oral Q6H PRN Theodoro Grist, MD       Or  . acetaminophen (TYLENOL) suppository 650 mg  650 mg Rectal Q6H PRN Theodoro Grist, MD      . ceFAZolin (ANCEF) IVPB 1 g/50 mL premix  1 g Intravenous On Call Stegmayer, Kimberly A, PA-C      . heparin injection 5,000 Units  5,000 Units Subcutaneous Q8H Theodoro Grist, MD   5,000 Units at 10/18/16 0618  . insulin aspart (novoLOG) injection 0-5 Units  0-5 Units Subcutaneous QHS [place], Rima, MD      . insulin aspart (novoLOG) injection 0-9 Units  0-9 Units Subcutaneous TID WC  Theodoro Grist, MD   2 Units at 10/17/16 1506  . insulin aspart (novoLOG) injection 3 Units  3 Units Subcutaneous TID WC Theodoro Grist, MD   3 Units at 10/17/16 1827  . ondansetron (ZOFRAN) tablet 4 mg  4 mg Oral Q6H PRN Theodoro Grist, MD   4 mg at 10/17/16 1205   Or  . ondansetron (ZOFRAN) injection 4 mg  4 mg Intravenous Q6H PRN Theodoro Grist, MD      . paliperidone (INVEGA) 24 hr tablet 3 mg  3 mg Oral Daily Theodoro Grist, MD  3 mg at 10/18/16 0926  . sodium chloride flush (NS) 0.9 % injection 3 mL  3 mL Intravenous Q12H Theodoro Grist, MD   3 mL at 10/17/16 1458  . [START ON 10/19/2016] venlafaxine XR (EFFEXOR-XR) 24 hr capsule 75 mg  75 mg Oral Q breakfast Clapacs, Madie Reno, MD        Musculoskeletal: Strength & Muscle Tone: decreased Gait & Station: unsteady Patient leans: N/A  Psychiatric Specialty Exam: Physical Exam  Nursing note and vitals reviewed. Constitutional: She appears well-developed and well-nourished.  HENT:  Head: Normocephalic and atraumatic.  Eyes: Conjunctivae are normal. Pupils are equal, round, and reactive to light.  Neck: Normal range of motion.  Cardiovascular: Regular rhythm and normal heart sounds.   Respiratory: Effort normal. No respiratory distress.  GI: Soft.  Musculoskeletal: Normal range of motion.  Neurological: She is alert.  Skin: Skin is warm and dry.  Psychiatric: She has a normal mood and affect. Her behavior is normal. Judgment and thought content normal.    Review of Systems  Constitutional: Negative.   HENT: Negative.   Eyes: Negative.   Respiratory: Negative.   Cardiovascular: Negative.   Gastrointestinal: Negative.   Musculoskeletal: Negative.   Skin: Negative.   Neurological: Negative.   Psychiatric/Behavioral: Negative for depression, hallucinations, memory loss, substance abuse and suicidal ideas. The patient is not nervous/anxious and does not have insomnia.     Blood pressure (!) 153/58, pulse 94, temperature 98.6 F (37  C), temperature source Oral, resp. rate 20, height (!) 5" (0.127 m), weight 207 lb (93.9 kg), SpO2 98 %.Body mass index is 5,821.48 kg/m.  General Appearance: Fairly Groomed  Eye Contact:  Good  Speech:  Clear and Coherent  Volume:  Normal  Mood:  Euthymic  Affect:  Congruent  Thought Process:  Goal Directed  Orientation:  Full (Time, Place, and Person)  Thought Content:  Logical  Suicidal Thoughts:  No  Homicidal Thoughts:  No  Memory:  Immediate;   Good Recent;   Good Remote;   Good  Judgement:  Fair  Insight:  Fair  Psychomotor Activity:  Decreased  Concentration:  Concentration: Fair  Recall:  [employer] of Knowledge:  Fair  Language:  Fair  Akathisia:  No  Handed:  Right  AIMS (if indicated):     Assets:  Desire for Improvement Housing Resilience Social Support  ADL's:  Intact  Cognition:  WNL  Sleep:        Treatment Plan Summary: Daily contact with patient to assess and evaluate symptoms and progress in treatment, Medication management and Plan 66 year old woman was admitted with psychiatric symptoms which in retrospect seem probably largely a function of her acute medical illness. She says she is feeling much better today. Denies feeling depressed. Denies any suicidal thoughts or psychosis. There is no indication right now for involuntary commitment or for suicide precautions or a sitter. I have discontinued all of those. Dr. Mamie Nick downstairs had recommended restarting Effexor which the patient had taken in the past with benefit. I have gone ahead and ordered that to restart at 75 mg a day. We will follow-up as needed.  Disposition: Patient does not meet criteria for psychiatric inpatient admission. Supportive therapy provided about ongoing stressors.  Alethia Berthold, MD 10/18/2016 4:34 PM

## 2016-10-18 NOTE — Progress Notes (Signed)
HD COMPLETED  

## 2016-10-18 NOTE — Progress Notes (Signed)
POST DIALYSYS ASSESSMENT

## 2016-10-18 NOTE — Progress Notes (Signed)
HD STARTED  

## 2016-10-18 NOTE — Progress Notes (Signed)
[facility]  ROUNDING NOTE   Subjective:  Patient did have dialysis session yesterday. She is due for another dialysis session today. She appears to be resting comfortably at the moment. Is not making very much urine at the moment.   Objective:  Vital signs in last 24 hours:  Temp:  [97.5 F (36.4 C)-98.4 F (36.9 C)] 97.7 F (36.5 C) (06/30 0823) Pulse Rate:  [77-108] 90 (06/30 0823) Resp:  [11-22] 16 (06/30 0823) BP: (82-154)/(49-91) 110/91 (06/30 0823) SpO2:  [96 %-99 %] 98 % (06/30 0823) Weight:  [85.7 kg (189 lb)-92.5 kg (204 lb)] 92.5 kg (204 lb) (06/30 0823)  Weight change:  Filed Weights   10/17/16 1647 10/17/16 1925 10/18/16 0823  Weight: 85.7 kg (189 lb) 85.8 kg (189 lb 2.5 oz) 92.5 kg (204 lb)    Intake/Output: I/O last 3 completed shifts: In: 2140 [P.O.:240; I.V.:1900] Out: 0    Intake/Output this shift:  Total I/O In: 240 [P.O.:240] Out: -   Physical Exam: General: No acute distress  Head: Normocephalic, atraumatic. Moist oral mucosal membranes  Eyes: Anicteric  Neck: Supple, trachea midline  Lungs:  Clear to auscultation, normal effort  Heart: S1S2 no rubs  Abdomen:  Soft, nontender, bowel sounds present  Extremities: No peripheral edema.  Neurologic: Awake, alert, following commands  Skin: No lesions  Access: Right femoral dialysis catheter    Basic Metabolic Panel:  Recent Labs Lab 10/14/16 1448 10/16/16 2002 10/17/16 0822 10/18/16 0436  NA 136 127* 123* 124*  K 4.7 5.0 4.8 4.4  CL 103 96* 90* 94*  CO2 23 15* 12* 22  GLUCOSE 197* 114* 109* 75  BUN 29* 73* 81* 61*  CREATININE 1.29* 5.78* 6.53* 5.50*  CALCIUM 10.6* 8.9 8.3* 7.4*  MG  --   --  1.4* 2.7*  PHOS  --   --  7.4* 4.6    Liver Function Tests:  Recent Labs Lab 10/14/16 1448  AST 22  ALT 24  ALKPHOS 52  BILITOT 0.6  PROT 8.7*  ALBUMIN 4.6   No results for input(s): LIPASE, AMYLASE in the last 168 hours. No results for input(s): AMMONIA in the last  168 hours.  CBC:  Recent Labs Lab 10/14/16 1448 10/16/16 2002 10/18/16 0436  WBC 8.8 11.1* 6.8  HGB 13.4 12.3 9.7*  HCT 41.0 38.3 28.6*  MCV 80.3 82.3 78.1*  PLT 238 183 142*    Cardiac Enzymes:  Recent Labs Lab 10/17/16 0822 10/17/16 1927 10/18/16 0005  CKTOTAL  --  106  --   TROPONINI <0.03 0.05* 0.04*    BNP: Invalid input(s): POCBNP  CBG:  Recent Labs Lab 10/17/16 2218 10/17/16 2239 10/17/16 2255 10/18/16 0737 10/18/16 0751  GLUCAP 63* 69 83 57* 83    Microbiology: No results found for this or any previous visit.  Coagulation Studies:  Recent Labs  10/17/16 1326  LABPROT 13.3  INR 1.01    Urinalysis: No results for input(s): COLORURINE, LABSPEC, PHURINE, GLUCOSEU, HGBUR, BILIRUBINUR, KETONESUR, PROTEINUR, UROBILINOGEN, NITRITE, LEUKOCYTESUR in the last 72 hours.  Invalid input(s): APPERANCEUR    Imaging: US Renal  Result Date: 10/17/2016 CLINICAL DATA:  Acute renal failure EXAM: RENAL / URINARY TRACT ULTRASOUND COMPLETE COMPARISON:  None. FINDINGS: Right Kidney: Length: 11.7. Echogenicity within normal limits. No mass or hydronephrosis visualized. Left Kidney: Length: 11.7. Echogenicity within normal limits. No mass or hydronephrosis visualized. Bladder: Decompressed.  Appears normal for degree of bladder distention. IMPRESSION: Normal renal ultrasound. Electronically Signed   By: Cherlynn Kaiser  Enriqueta Shutter M.D.   On: 10/17/2016 16:35     Medications:   . sodium chloride 100 mL/hr at 10/18/16 0038  .  ceFAZolin (ANCEF) IV     . heparin  5,000 Units Subcutaneous Q8H  . insulin aspart  0-5 Units Subcutaneous QHS  . insulin aspart  0-9 Units Subcutaneous TID WC  . insulin aspart  3 Units Subcutaneous TID WC  . paliperidone  3 mg Oral Daily  . sodium chloride flush  3 mL Intravenous Q12H   acetaminophen **OR** acetaminophen, ondansetron **OR** ondansetron (ZOFRAN) IV  Assessment/ Plan:  66 y.o. female with a PMHx of Osteoarthritis, bipolar 1  disorder, chronic kidney disease stage III baseline EGFR 51, diabetes mellitus type 2, GERD, hyperlipidemia, hypertension, hypothyroidism, paranoia who was admitted to [facility] on 10/17/2016 for evaluation of severe acute renal failure. Prior to being admitted to the inpatient medicine service she was being seen by psychiatry.  She was admitted to the psychiatric service for severe bipolar 1 disorder with psychotic features.   1. Severe acute renal failure, etiology currently unclear. 2. Metabolic acidosis. 3. Hyponatremia.  Plan:  Acute renal failure persists at this time. Awaiting additional serologic workup.  Salicylate level was negative and CK was normal. Patient making very much urine. Therefore we will proceed with another dialysis treatment today. We will reassess daily for need of additional dialysis. Additional serologic workup currently pending. We will continue to monitor her progress closely.   LOS: 1 Gedalia Mcmillon 6/30/201811:17 AM

## 2016-10-18 NOTE — H&P (Signed)
H&P is DR Nichola Sizer consult note as of June 28th, 2018, thanks

## 2016-10-18 NOTE — Progress Notes (Signed)
[place] at [place] NAME: Erika Jacobs    MR#:  [id number]  DATE OF BIRTH:  [birth year]  SUBJECTIVE:   Patient here due to acute renal failure now requiring hemodialysis. Remains oliguric. Had hemodialysis yesterday and we'll get another session today. Patient was transferred from behavioral medicine and still has a one-to-one Actuary. Denies suicidal or homicidal ideations presently. No other acute events or complaints presently.  REVIEW OF SYSTEMS:    Review of Systems  Constitutional: Negative for chills and fever.  HENT: Negative for congestion and tinnitus.   Eyes: Negative for blurred vision and double vision.  Respiratory: Negative for cough, shortness of breath and wheezing.   Cardiovascular: Negative for chest pain, orthopnea and PND.  Gastrointestinal: Negative for abdominal pain, diarrhea, nausea and vomiting.  Genitourinary: Negative for dysuria and hematuria.  Neurological: Negative for dizziness, sensory change and focal weakness.  All other systems reviewed and are negative.   Nutrition: Renal diet Tolerating Diet: Yes Tolerating PT: Await Eval.    DRUG ALLERGIES:   Allergies  Allergen Reactions  . Aripiprazole Other (See Comments)    Pt shakes when taking this medication.  Lorayne Bender [Paliperidone]     High doses causes patient to shake  . Lisinopril Other (See Comments)    Kidney   . Risperidone And Related     hallucinations    VITALS:  Blood pressure (!) 162/72, pulse 88, temperature 97.5 F (36.4 C), temperature source Oral, resp. rate (!) 21, height (!) 5" (0.127 m), weight 94.8 kg (208 lb 15.9 oz), SpO2 98 %.  PHYSICAL EXAMINATION:   Physical Exam  GENERAL:  66 y.o.-year-old obese patient lying in bed in no acute distress.  EYES: Pupils equal, round, reactive to light and accommodation. No scleral icterus. Extraocular muscles intact.  HEENT: Head atraumatic, normocephalic. Oropharynx and nasopharynx  clear.  NECK:  Supple, no jugular venous distention. No thyroid enlargement, no tenderness.  LUNGS: Normal breath sounds bilaterally, no wheezing, rales, rhonchi. No use of accessory muscles of respiration.  CARDIOVASCULAR: S1, S2 normal. No murmurs, rubs, or gallops.  ABDOMEN: Soft, nontender, nondistended. Bowel sounds present. No organomegaly or mass.  EXTREMITIES: No cyanosis, clubbing or edema b/l.    NEUROLOGIC: Cranial nerves II through XII are intact. No focal Motor or sensory deficits b/l.   PSYCHIATRIC: The patient is alert and oriented x 3.  SKIN: No obvious rash, lesion, or ulcer.   Right groin temp. Cath in place.    LABORATORY PANEL:   CBC  Recent Labs Lab 10/18/16 0436  WBC 6.8  HGB 9.7*  HCT 28.6*  PLT 142*   ------------------------------------------------------------------------------------------------------------------  Chemistries   Recent Labs Lab 10/14/16 1448  10/18/16 0436  NA 136  < > 124*  K 4.7  < > 4.4  CL 103  < > 94*  CO2 23  < > 22  GLUCOSE 197*  < > 75  BUN 29*  < > 61*  CREATININE 1.29*  < > 5.50*  CALCIUM 10.6*  < > 7.4*  MG  --   < > 2.7*  AST 22  --   --   ALT 24  --   --   ALKPHOS 52  --   --   BILITOT 0.6  --   --   < > = values in this interval not displayed. ------------------------------------------------------------------------------------------------------------------  Cardiac Enzymes  Recent Labs Lab 10/18/16 0005  TROPONINI 0.04*   ------------------------------------------------------------------------------------------------------------------  RADIOLOGY:  US  Renal  Result Date: 10/17/2016 CLINICAL DATA:  Acute renal failure EXAM: RENAL / URINARY TRACT ULTRASOUND COMPLETE COMPARISON:  None. FINDINGS: Right Kidney: Length: 11.7. Echogenicity within normal limits. No mass or hydronephrosis visualized. Left Kidney: Length: 11.7. Echogenicity within normal limits. No mass or hydronephrosis visualized. Bladder:  Decompressed.  Appears normal for degree of bladder distention. IMPRESSION: Normal renal ultrasound. Electronically Signed   By: Franki Cabot M.D.   On: 10/17/2016 16:35     ASSESSMENT AND PLAN:   66 year old female with past medical history of hypertension, hypothyroidism, bipolar disorder, osteoarthritis, diabetes, GERD who was admitted to the behavioral medicine for suicidal ideations and transferred to the medical service due to acute renal failure with oliguria.  1. Acute renal failure-etiology unclear presently. Patient is oliguric. -Seen by nephrology and started on hemodialysis yesterday. Continue further care as per them. -Serologic workup has been initiated and was also still pending. - cont. IV Fluids for now.   2. Metabolic acidosis-secondary to the acute kidney injury. Should correct with hemodialysis.  3. Hyponatremia-also secondary to her acute kidney injury. Continue IV fluids, continue dialysis as per nephrology. Follow sodium. Clinically asymptomatic.  4. History of depression with suicidal ideations-currently denies any suicidal or homicidal ideations. - pt. Still has a 1:1 sitter. await further psychiatric input. - cont. Invega.    5. DM - cont. SSI and BS stable.     All the records are reviewed and case discussed with Care Management/Social Worker. Management plans discussed with the patient, family and they are in agreement.  CODE STATUS: Full code  DVT Prophylaxis: Hep. SQ  TOTAL TIME TAKING CARE OF THIS PATIENT: 30 minutes.   POSSIBLE D/C IN 1-2 DAYS, DEPENDING ON CLINICAL CONDITION.   Henreitta Leber M.D on 10/18/2016 at 1:41 PM  Between 7am to 6pm - Pager - [phone]  After 6pm go to [email or web address] - Technical brewer [facility]  Office  [phone]  CC: Primary care physician; Monico Blitz, MD

## 2016-10-19 LAB — BASIC METABOLIC PANEL
Anion gap: 8 (ref 5–15)
BUN: 31 mg/dL — AB (ref 6–20)
CHLORIDE: 98 mmol/L — AB (ref 101–111)
CO2: 25 mmol/L (ref 22–32)
CREATININE: 4.26 mg/dL — AB (ref 0.44–1.00)
Calcium: 7.3 mg/dL — ABNORMAL LOW (ref 8.9–10.3)
GFR calc Af Amer: 12 mL/min — ABNORMAL LOW (ref >60)
GFR calc non Af Amer: 10 mL/min — ABNORMAL LOW (ref >60)
Glucose, Bld: 124 mg/dL — ABNORMAL HIGH (ref 65–99)
Potassium: 4.2 mmol/L (ref 3.5–5.1)
Sodium: 131 mmol/L — ABNORMAL LOW (ref 135–145)

## 2016-10-19 LAB — C3 COMPLEMENT: C3 Complement: 122 mg/dL (ref 82–167)

## 2016-10-19 LAB — HEPATITIS B SURFACE ANTIBODY, QUANTITATIVE

## 2016-10-19 LAB — HEPATITIS B SURFACE ANTIGEN: Hepatitis B Surface Ag: NEGATIVE

## 2016-10-19 LAB — GLUCOSE, CAPILLARY
GLUCOSE-CAPILLARY: 145 mg/dL — AB (ref 65–99)
Glucose-Capillary: 127 mg/dL — ABNORMAL HIGH (ref 65–99)
Glucose-Capillary: 254 mg/dL — ABNORMAL HIGH (ref 65–99)
Glucose-Capillary: 157 mg/dL — ABNORMAL HIGH (ref 65–99)

## 2016-10-19 LAB — HEPATITIS B CORE ANTIBODY, TOTAL: HEP B C TOTAL AB: NEGATIVE

## 2016-10-19 LAB — C4 COMPLEMENT: COMPLEMENT C4, BODY FLUID: 27 mg/dL (ref 14–44)

## 2016-10-19 NOTE — Progress Notes (Signed)
[place] at [place] NAME: Erika Jacobs    MR#:  [id number]  DATE OF BIRTH:  [birth year]  SUBJECTIVE:   Patient here due to acute renal failure now requiring hemodialysis. Renal function is improved, making good urine now. No need for acute dialysis presently. Creatinine improving. No other acute events overnight. IVC has been lifted and sitter discontinued.  REVIEW OF SYSTEMS:    Review of Systems  Constitutional: Negative for chills and fever.  HENT: Negative for congestion and tinnitus.   Eyes: Negative for blurred vision and double vision.  Respiratory: Negative for cough, shortness of breath and wheezing.   Cardiovascular: Negative for chest pain, orthopnea and PND.  Gastrointestinal: Negative for abdominal pain, diarrhea, nausea and vomiting.  Genitourinary: Negative for dysuria and hematuria.  Neurological: Negative for dizziness, sensory change and focal weakness.  All other systems reviewed and are negative.   Nutrition: Renal diet Tolerating Diet: Yes Tolerating PT: Await Eval.    DRUG ALLERGIES:   Allergies  Allergen Reactions  . Aripiprazole Other (See Comments)    Pt shakes when taking this medication.  Lorayne Bender [Paliperidone]     High doses causes patient to shake  . Lisinopril Other (See Comments)    Kidney   . Risperidone And Related     hallucinations    VITALS:  Blood pressure 140/80, pulse (!) 131, temperature 98.4 F (36.9 C), temperature source Oral, resp. rate 14, height (!) 5" (0.127 m), weight 93.4 kg (206 lb), SpO2 95 %.  PHYSICAL EXAMINATION:   Physical Exam  GENERAL:  66 y.o.-year-old obese patient lying in bed in no acute distress.  EYES: Pupils equal, round, reactive to light and accommodation. No scleral icterus. Extraocular muscles intact.  HEENT: Head atraumatic, normocephalic. Oropharynx and nasopharynx clear.  NECK:  Supple, no jugular venous distention. No thyroid enlargement, no  tenderness.  LUNGS: Normal breath sounds bilaterally, no wheezing, rales, rhonchi. No use of accessory muscles of respiration.  CARDIOVASCULAR: S1, S2 normal. No murmurs, rubs, or gallops.  ABDOMEN: Soft, nontender, nondistended. Bowel sounds present. No organomegaly or mass.  EXTREMITIES: No cyanosis, clubbing or edema b/l.    NEUROLOGIC: Cranial nerves II through XII are intact. No focal Motor or sensory deficits b/l.   PSYCHIATRIC: The patient is alert and oriented x 3.  SKIN: No obvious rash, lesion, or ulcer.   Right groin temp. Cath in place.    LABORATORY PANEL:   CBC  Recent Labs Lab 10/18/16 0436  WBC 6.8  HGB 9.7*  HCT 28.6*  PLT 142*   ------------------------------------------------------------------------------------------------------------------  Chemistries   Recent Labs Lab 10/14/16 1448  10/18/16 0436 10/19/16 0456  NA 136  < > 124* 131*  K 4.7  < > 4.4 4.2  CL 103  < > 94* 98*  CO2 23  < > 22 25  GLUCOSE 197*  < > 75 124*  BUN 29*  < > 61* 31*  CREATININE 1.29*  < > 5.50* 4.26*  CALCIUM 10.6*  < > 7.4* 7.3*  MG  --   < > 2.7*  --   AST 22  --   --   --   ALT 24  --   --   --   ALKPHOS 52  --   --   --   BILITOT 0.6  --   --   --   < > = values in this interval not displayed. ------------------------------------------------------------------------------------------------------------------  Cardiac Enzymes  Recent  Labs Lab 10/18/16 0005  TROPONINI 0.04*   ------------------------------------------------------------------------------------------------------------------  RADIOLOGY:  US Renal  Result Date: 10/17/2016 CLINICAL DATA:  Acute renal failure EXAM: RENAL / URINARY TRACT ULTRASOUND COMPLETE COMPARISON:  None. FINDINGS: Right Kidney: Length: 11.7. Echogenicity within normal limits. No mass or hydronephrosis visualized. Left Kidney: Length: 11.7. Echogenicity within normal limits. No mass or hydronephrosis visualized. Bladder:  Decompressed.  Appears normal for degree of bladder distention. IMPRESSION: Normal renal ultrasound. Electronically Signed   By: Franki Cabot M.D.   On: 10/17/2016 16:35     ASSESSMENT AND PLAN:   66 year old female with past medical history of hypertension, hypothyroidism, bipolar disorder, osteoarthritis, diabetes, GERD who was admitted to the behavioral medicine for suicidal ideations and transferred to the medical service due to acute renal failure with oliguria.  1. Acute renal failure-etiology unclear presently. -Seen by nephrology and started on hemodialysis and s/p 2 treatments. Cr. Improved and now making good urine.  -Serologic workup has been initiated and still pending.  - will cont. IV fluids but will taper as Cr. Improving and now making good Urine. Appreciate Nephro help.   2. Metabolic acidosis-secondary to the acute kidney injury.  - resolved w/ HD.   3. Hyponatremia-also secondary to her acute kidney injury.  - improved w/ fluids and as pt's renal function has improved.   4. History of depression with suicidal ideations-currently denies any suicidal or homicidal ideations. - cont. Invega.  Seen by Psych and IV Discontinued and sitter d/c.   - started back on pt's Effexor.    5. DM - cont. SSI and BS stable.   If renal function continues to improve then likely d/c home.  Will get PT eval.   All the records are reviewed and case discussed with Care Management/Social Worker. Management plans discussed with the patient, family and they are in agreement.  CODE STATUS: Full code  DVT Prophylaxis: Hep. SQ  TOTAL TIME TAKING CARE OF THIS PATIENT: 25 minutes.   POSSIBLE D/C IN 1-2 DAYS, DEPENDING ON CLINICAL CONDITION.   Henreitta Leber M.D on 10/19/2016 at 11:57 AM  Between 7am to 6pm - Pager - [phone]  After 6pm go to [email or web address] - Technical brewer [facility]  Office  [phone]  CC: Primary care physician; Monico Blitz, MD

## 2016-10-19 NOTE — Progress Notes (Signed)
[facility]  ROUNDING NOTE   Subjective:  Renal function significantly improved after dialysis yesterday. Next line urine output was 4.8 L over the preceding 24 hours. Creatinine currently 4.26. Therefore no further indication for dialysis at the moment.   Objective:  Vital signs in last 24 hours:  Temp:  [97.5 F (36.4 C)-98.8 F (37.1 C)] 98.7 F (37.1 C) (07/01 0335) Pulse Rate:  [84-106] 106 (07/01 0335) Resp:  [15-23] 18 (07/01 0335) BP: (138-166)/(58-77) 155/62 (07/01 0335) SpO2:  [95 %-98 %] 95 % (07/01 0335) Weight:  [93.4 kg (206 lb)-94.8 kg (208 lb 15.9 oz)] 93.4 kg (206 lb) (07/01 0335)  Weight change: 6.804 kg (15 lb) Filed Weights   10/18/16 1150 10/18/16 1448 10/19/16 0335  Weight: 94.8 kg (208 lb 15.9 oz) 93.9 kg (207 lb) 93.4 kg (206 lb)    Intake/Output: I/O last 3 completed shifts: In: 3881.7 [P.O.:360; I.V.:3521.7] Out: 4820 [Urine:4820]   Intake/Output this shift:  Total I/O In: -  Out: 750 [Urine:750]  Physical Exam: General: No acute distress  Head: Normocephalic, atraumatic. Moist oral mucosal membranes  Eyes: Anicteric  Neck: Supple, trachea midline  Lungs:  Clear to auscultation, normal effort  Heart: S1S2 no rubs  Abdomen:  Soft, nontender, bowel sounds present  Extremities: No peripheral edema.  Neurologic: Awake, alert, following commands  Skin: No lesions  Access: Right femoral dialysis catheter    Basic Metabolic Panel:  Recent Labs Lab 10/14/16 1448 10/16/16 2002 10/17/16 0822 10/18/16 0436 10/19/16 0456  NA 136 127* 123* 124* 131*  K 4.7 5.0 4.8 4.4 4.2  CL 103 96* 90* 94* 98*  CO2 23 15* 12* 22 25  GLUCOSE 197* 114* 109* 75 124*  BUN 29* 73* 81* 61* 31*  CREATININE 1.29* 5.78* 6.53* 5.50* 4.26*  CALCIUM 10.6* 8.9 8.3* 7.4* 7.3*  MG  --   --  1.4* 2.7*  --   PHOS  --   --  7.4* 4.6  --     Liver Function Tests:  Recent Labs Lab 10/14/16 1448  AST 22  ALT 24  ALKPHOS 52  BILITOT 0.6  PROT 8.7*   ALBUMIN 4.6   No results for input(s): LIPASE, AMYLASE in the last 168 hours. No results for input(s): AMMONIA in the last 168 hours.  CBC:  Recent Labs Lab 10/14/16 1448 10/16/16 2002 10/18/16 0436  WBC 8.8 11.1* 6.8  HGB 13.4 12.3 9.7*  HCT 41.0 38.3 28.6*  MCV 80.3 82.3 78.1*  PLT 238 183 142*    Cardiac Enzymes:  Recent Labs Lab 10/17/16 0822 10/17/16 1927 10/18/16 0005  CKTOTAL  --  106  --   TROPONINI <0.03 0.05* 0.04*    BNP: Invalid input(s): POCBNP  CBG:  Recent Labs Lab 10/18/16 0737 10/18/16 0751 10/18/16 1625 10/18/16 2043 10/19/16 0734  GLUCAP 57* 83 123* 169* 127*    Microbiology: No results found for this or any previous visit.  Coagulation Studies:  Recent Labs  10/17/16 1326  LABPROT 13.3  INR 1.01    Urinalysis:  Recent Labs  10/18/16 1800  COLORURINE STRAW*  LABSPEC 1.003*  PHURINE 7.0  GLUCOSEU NEGATIVE  HGBUR MODERATE*  BILIRUBINUR NEGATIVE  KETONESUR NEGATIVE  PROTEINUR NEGATIVE  NITRITE NEGATIVE  LEUKOCYTESUR SMALL*      Imaging: US Renal  Result Date: 10/17/2016 CLINICAL DATA:  Acute renal failure EXAM: RENAL / URINARY TRACT ULTRASOUND COMPLETE COMPARISON:  None. FINDINGS: Right Kidney: Length: 11.7. Echogenicity within normal limits. No mass or hydronephrosis  visualized. Left Kidney: Length: 11.7. Echogenicity within normal limits. No mass or hydronephrosis visualized. Bladder: Decompressed.  Appears normal for degree of bladder distention. IMPRESSION: Normal renal ultrasound. Electronically Signed   By: Franki Cabot M.D.   On: 10/17/2016 16:35     Medications:   . sodium chloride 100 mL/hr at 10/19/16 0532   . heparin  5,000 Units Subcutaneous Q8H  . insulin aspart  0-5 Units Subcutaneous QHS  . insulin aspart  0-9 Units Subcutaneous TID WC  . insulin aspart  3 Units Subcutaneous TID WC  . paliperidone  3 mg Oral Daily  . sodium chloride flush  3 mL Intravenous Q12H  . venlafaxine XR  75 mg Oral Q  breakfast   acetaminophen **OR** acetaminophen, ondansetron **OR** ondansetron (ZOFRAN) IV  Assessment/ Plan:  66 y.o. female with a PMHx of Osteoarthritis, bipolar 1 disorder, chronic kidney disease stage III baseline EGFR 51, diabetes mellitus type 2, GERD, hyperlipidemia, hypertension, hypothyroidism, paranoia who was admitted to [facility] on 10/17/2016 for evaluation of severe acute renal failure. Prior to being admitted to the inpatient medicine service she was being seen by psychiatry.  She was admitted to the psychiatric service for severe bipolar 1 disorder with psychotic features.   1. Severe acute renal failure, etiology currently unclear. 2. Metabolic acidosis. 3. Hyponatremia.  Plan:  The patient's renal function now appears to be improving. Serologic workup was sent off but much of this is still pending including serum volatile screen.  Salicylate level was found to be negative. Patient has undergone 2 dialysis treatments. However urine output yesterday increased to 4.8 L. Therefore no further indication for dialysis. I have requested nursing to remove the patient's temporary dialysis catheter. We will also reduce IV fluids to 50 cc per hour. Continue to monitor serum electrolytes daily for now. Avoid nephrotoxins as possible.   LOS: 2 ,  7/1/20189:09 AM

## 2016-10-20 ENCOUNTER — Encounter: Payer: Self-pay | Admitting: Vascular Surgery

## 2016-10-20 LAB — PROTEIN ELECTRO, RANDOM URINE
ALBUMIN ELP UR: 28 %
ALPHA-1-GLOBULIN, U: 5 %
Alpha-2-Globulin, U: 28.7 %
Beta Globulin, U: 24.4 %
GAMMA GLOBULIN, U: 13.9 %
Total Protein, Urine: 22.5 mg/dL

## 2016-10-20 LAB — BASIC METABOLIC PANEL
Anion gap: 8 (ref 5–15)
BUN: 26 mg/dL — AB (ref 6–20)
CHLORIDE: 101 mmol/L (ref 101–111)
CO2: 24 mmol/L (ref 22–32)
Calcium: 7.7 mg/dL — ABNORMAL LOW (ref 8.9–10.3)
Creatinine, Ser: 3.47 mg/dL — ABNORMAL HIGH (ref 0.44–1.00)
GFR calc Af Amer: 15 mL/min — ABNORMAL LOW (ref >60)
GFR calc non Af Amer: 13 mL/min — ABNORMAL LOW (ref >60)
GLUCOSE: 155 mg/dL — AB (ref 65–99)
POTASSIUM: 3.8 mmol/L (ref 3.5–5.1)
SODIUM: 133 mmol/L — AB (ref 135–145)

## 2016-10-20 LAB — PROTEIN ELECTROPHORESIS, SERUM
A/G Ratio: 1.2 (ref 0.7–1.7)
ALPHA-1-GLOBULIN: 0.2 g/dL (ref 0.0–0.4)
Albumin ELP: 2.8 g/dL — ABNORMAL LOW (ref 2.9–4.4)
Alpha-2-Globulin: 0.6 g/dL (ref 0.4–1.0)
BETA GLOBULIN: 0.7 g/dL (ref 0.7–1.3)
GAMMA GLOBULIN: 0.8 g/dL (ref 0.4–1.8)
Globulin, Total: 2.3 g/dL (ref 2.2–3.9)
TOTAL PROTEIN ELP: 5.1 g/dL — AB (ref 6.0–8.5)

## 2016-10-20 LAB — GLUCOSE, CAPILLARY
GLUCOSE-CAPILLARY: 129 mg/dL — AB (ref 65–99)
GLUCOSE-CAPILLARY: 166 mg/dL — AB (ref 65–99)
Glucose-Capillary: 122 mg/dL — ABNORMAL HIGH (ref 65–99)
Glucose-Capillary: 133 mg/dL — ABNORMAL HIGH (ref 65–99)
Glucose-Capillary: 153 mg/dL — ABNORMAL HIGH (ref 65–99)

## 2016-10-20 LAB — MPO/PR-3 (ANCA) ANTIBODIES

## 2016-10-20 LAB — ANA W/REFLEX IF POSITIVE: Anti Nuclear Antibody(ANA): NEGATIVE

## 2016-10-20 LAB — GLOMERULAR BASEMENT MEMBRANE ANTIBODIES: GBM AB: 4 U (ref 0–20)

## 2016-10-20 NOTE — Consult Note (Signed)
[facility] Consult   Reason for Consult:  Consult for 66 year old woman transferred to the medical service from psychiatry Referring Physician:  Ether Griffins Patient Identification: Erika Jacobs MRN:  [id number] Principal Diagnosis: Bipolar I disorder, severe, current or most recent episode depressed, with psychotic features [facility]) Diagnosis:   Patient Active Problem List   Diagnosis Date Noted  . UTI (urinary tract infection) [N39.0] 10/17/2016  . CKD (chronic kidney disease), stage III [N18.3] 10/17/2016  . Acute renal failure (ARF) ([place]) [N17.9] 10/17/2016  . Hypotension [I95.9] 10/17/2016  . Tachycardia [R00.0] 10/17/2016  . Leukocytosis [D72.829] 10/17/2016  . Bipolar I disorder, severe, current or most recent episode depressed, with psychotic features ([place]) [F31.5] 10/20/2014  . Dyslipidemia [E78.5] 10/18/2014  . GERD (gastroesophageal reflux disease) [K21.9] 10/18/2014  . Hypothyroidism [E03.9] 10/18/2014  . Diabetes ([place]) [E11.9] 10/17/2014  . Hypertension [I10] 10/17/2014  . Urinary incontinence [R32] 10/17/2014  . Suicidal ideation [R45.851]   . Cholecystitis with cholelithiasis [K80.10] 06/02/2013    Total Time spent with patient: 15 minutes  Subjective:   Erika Jacobs is a 66 y.o. female patient admitted with "I'm feeling a lot better today".  Follow-up for Monday, July 2. Patient has no new complaints. Mood is feeling good. Denies hallucinations. Denies suicidal thoughts. Patient is awake alert oriented and interactive. Tolerating medicine without difficulty.  HPI:  Patient seen chart reviewed. 66 year old woman who was admitted to the psychiatric service but transferred soon thereafter to the medical service because of worsening renal failure. Since being on the medical service she has had acute dialysis and is in discussion with nephrology about her plan going forward. She was admitted to the psychiatric service because of complaints that were  thought to be part of mania or depression with psychosis. Patient tells me today that she had been feeling both physically and mentally run down and sick for many weeks. She says the physical was the bigger part than the mental although when she gets sick she feels more depressed. She had been having trouble sleeping her energy of been poor. She denies however that she had had any suicidal thoughts and she denies that she had been having any hallucinations. Certainly today she denies any hallucinations. Says she is having no thoughts about wanting to hurt her self at all. Mood is feeling more optimistic and upbeat. She had been continuing to take [facility] as an outpatient which had been gradually being tapered by her outpatient psychiatrist. It does not seem like she had been on an antidepressant recently.  Social history: Patient lives alone. She has relatives however who are attentive and had assisted her in coming into the hospital.  Medical history: Has had some problems with her kidneys brewing for quite a while. History of diabetes. Multiple medical problems.  Substance abuse history: Denies alcohol or drug abuse  Past Psychiatric History: Past history of a diagnosis of bipolar disorder with psychotic symptoms that was being treated with an antipsychotic. She has a regular outpatient psychiatrist. Her antipsychotic was being gradually tapered. Denies any past history of suicide attempts  Risk to Self: Is patient at risk for suicide?: Yes Risk to Others:   Prior Inpatient Therapy:   Prior Outpatient Therapy:    Past Medical History:  Past Medical History:  Diagnosis Date  . Anemia   . Anxiety   . Arthritis   . Bipolar 1 disorder ([place])   . Chronic constipation   . Chronic headache   . CKD (chronic kidney disease)   .  Diabetes mellitus without complication ([place])   . Diabetic polyneuropathy ([place])   . Gastritis and duodenitis   . GERD (gastroesophageal reflux disease)   .  Hypercholesteremia   . Hypertension   . Hypothyroid   . Overactive bladder   . Panic attack   . Paranoia ([place])   . Psychosis   . Shortness of breath   . Thought disorder     Past Surgical History:  Procedure Laterality Date  . CHOLECYSTECTOMY N/A 06/02/2013   Procedure: LAPAROSCOPIC CHOLECYSTECTOMY;  Surgeon: Scherry Ran, MD;  Location: [facility] ORS;  Service: General;  Laterality: N/A;  . COLONOSCOPY WITH ESOPHAGOGASTRODUODENOSCOPY (EGD) N/A 05/20/2013   Procedure: COLONOSCOPY WITH ESOPHAGOGASTRODUODENOSCOPY (EGD);  Surgeon: Rogene Houston, MD;  Location: [facility] ENDO SUITE;  Service: Endoscopy;  Laterality: N/A;  730  . DIALYSIS/PERMA CATHETER INSERTION N/A 10/17/2016   Procedure: Dialysis/Perma Catheter Insertion;  Surgeon: Katha Cabal, MD;  Location: [facility] CV LAB;  Service: Cardiovascular;  Laterality: N/A;  . EYE SURGERY Left   . TUBAL LIGATION     Family History: History reviewed. No pertinent family history. Family Psychiatric  History: Denies any Social History:  History  Alcohol Use No     History  Drug Use No    Social History   Social History  . Marital status: Divorced    Spouse name: N/A  . Number of children: N/A  . Years of education: N/A   Social History Main Topics  . Smoking status: Former Smoker    Packs/day: 2.00    Years: 30.00    Types: Cigarettes    Start date: 10/18/1967    Quit date: 05/20/2002  . Smokeless tobacco: Never Used  . Alcohol use No  . Drug use: No  . Sexual activity: Yes    Birth control/ protection: Post-menopausal   Other Topics Concern  . None   Social History Narrative  . None   Additional Social History:    Allergies:   Allergies  Allergen Reactions  . Aripiprazole Other (See Comments)    Pt shakes when taking this medication.  Lorayne Bender [Paliperidone]     High doses causes patient to shake  . Lisinopril Other (See Comments)    Kidney   . Risperidone And Related     hallucinations    Labs:   Results for orders placed or performed during the hospital encounter of 10/17/16 (from the past 48 hour(s))  Urinalysis, Complete w Microscopic     Status: Abnormal   Collection Time: 10/18/16  6:00 PM  Result Value Ref Range   Color, Urine STRAW (A) YELLOW   APPearance CLEAR (A) CLEAR   Specific Gravity, Urine 1.003 (L) 1.005 - 1.030   pH 7.0 5.0 - 8.0   Glucose, UA NEGATIVE NEGATIVE mg/dL   Hgb urine dipstick MODERATE (A) NEGATIVE   Bilirubin Urine NEGATIVE NEGATIVE   Ketones, ur NEGATIVE NEGATIVE mg/dL   Protein, ur NEGATIVE NEGATIVE mg/dL   Nitrite NEGATIVE NEGATIVE   Leukocytes, UA SMALL (A) NEGATIVE   RBC / HPF 0-5 0 - 5 RBC/hpf   WBC, UA 6-30 0 - 5 WBC/hpf   Bacteria, UA RARE (A) NONE SEEN   Squamous Epithelial / LPF 0-5 (A) NONE SEEN  Urine culture     Status: Abnormal (Preliminary result)   Collection Time: 10/18/16  6:00 PM  Result Value Ref Range   Specimen Description URINE, RANDOM    Special Requests NONE    Culture (A)  40,000 COLONIES/mL ESCHERICHIA COLI SUSCEPTIBILITIES TO FOLLOW Performed at [facility] [address], [place], [place] [address]    Report Status PENDING   Protein / creatinine ratio, urine     Status: Abnormal   Collection Time: 10/18/16  6:00 PM  Result Value Ref Range   Creatinine, Urine 16 mg/dL   Total Protein, Urine 35 mg/dL    Comment: NO NORMAL RANGE ESTABLISHED FOR THIS TEST   Protein Creatinine Ratio 2.19 (H) 0.00 - 0.15 mg/mg[Cre]  Protein Electro, Random Urine     Status: None   Collection Time: 10/18/16  6:00 PM  Result Value Ref Range   Total Protein, Urine 22.5 Not Estab. mg/dL   Albumin ELP, Urine 28.0 %   Alpha-1-Globulin, U 5.0 %   Alpha-2-Globulin, U 28.7 %   Beta Globulin, U 24.4 %   Gamma Globulin, U 13.9 %   M Component, Ur Not Observed Not Observed %   Please Note: Comment     Comment: (NOTE) Protein electrophoresis scan will follow via computer, mail, or courier delivery. Performed At: [facility] [place], [place] [id number] Lindon Romp MD [id number]   Glucose, capillary     Status: Abnormal   Collection Time: 10/18/16  8:43 PM  Result Value Ref Range   Glucose-Capillary 169 (H) 65 - 99 mg/dL  Basic metabolic panel     Status: Abnormal   Collection Time: 10/19/16  4:56 AM  Result Value Ref Range   Sodium 131 (L) 135 - 145 mmol/L   Potassium 4.2 3.5 - 5.1 mmol/L   Chloride 98 (L) 101 - 111 mmol/L   CO2 25 22 - 32 mmol/L   Glucose, Bld 124 (H) 65 - 99 mg/dL   BUN 31 (H) 6 - 20 mg/dL   Creatinine, Ser 4.26 (H) 0.44 - 1.00 mg/dL   Calcium 7.3 (L) 8.9 - 10.3 mg/dL   GFR calc non Af Amer 10 (L) >60 mL/min   GFR calc Af Amer 12 (L) >60 mL/min    Comment: (NOTE) The eGFR has been calculated using the CKD EPI equation. This calculation has not been validated in all clinical situations. eGFR's persistently <60 mL/min signify possible Chronic Kidney Disease.    Anion gap 8 5 - 15  Glucose, capillary     Status: Abnormal   Collection Time: 10/19/16  7:34 AM  Result Value Ref Range   Glucose-Capillary 127 (H) 65 - 99 mg/dL   Comment 1 Notify RN   Glucose, capillary     Status: Abnormal   Collection Time: 10/19/16 11:34 AM  Result Value Ref Range   Glucose-Capillary 254 (H) 65 - 99 mg/dL  Glucose, capillary     Status: Abnormal   Collection Time: 10/19/16  4:39 PM  Result Value Ref Range   Glucose-Capillary 157 (H) 65 - 99 mg/dL  Glucose, capillary     Status: Abnormal   Collection Time: 10/19/16  8:42 PM  Result Value Ref Range   Glucose-Capillary 145 (H) 65 - 99 mg/dL  Basic metabolic panel     Status: Abnormal   Collection Time: 10/20/16  5:09 AM  Result Value Ref Range   Sodium 133 (L) 135 - 145 mmol/L   Potassium 3.8 3.5 - 5.1 mmol/L   Chloride 101 101 - 111 mmol/L   CO2 24 22 - 32 mmol/L   Glucose, Bld 155 (H) 65 - 99 mg/dL   BUN 26 (H) 6 - 20 mg/dL  Creatinine, Ser 3.47 (H) 0.44 - 1.00 mg/dL   Calcium 7.7 (L) 8.9 - 10.3 mg/dL    GFR calc non Af Amer 13 (L) >60 mL/min   GFR calc Af Amer 15 (L) >60 mL/min    Comment: (NOTE) The eGFR has been calculated using the CKD EPI equation. This calculation has not been validated in all clinical situations. eGFR's persistently <60 mL/min signify possible Chronic Kidney Disease.    Anion gap 8 5 - 15  Glucose, capillary     Status: Abnormal   Collection Time: 10/20/16  7:22 AM  Result Value Ref Range   Glucose-Capillary 133 (H) 65 - 99 mg/dL  Glucose, capillary     Status: Abnormal   Collection Time: 10/20/16 11:24 AM  Result Value Ref Range   Glucose-Capillary 129 (H) 65 - 99 mg/dL    Current Facility-Administered Medications  Medication Dose Route Frequency Provider Last Rate Last Dose  . acetaminophen (TYLENOL) tablet 650 mg  650 mg Oral Q6H PRN Theodoro Grist, MD       Or  . acetaminophen (TYLENOL) suppository 650 mg  650 mg Rectal Q6H PRN Theodoro Grist, MD      . heparin injection 5,000 Units  5,000 Units Subcutaneous Q8H Theodoro Grist, MD   5,000 Units at 10/20/16 0535  . insulin aspart (novoLOG) injection 0-5 Units  0-5 Units Subcutaneous QHS [place], Rima, MD      . insulin aspart (novoLOG) injection 0-9 Units  0-9 Units Subcutaneous TID WC Theodoro Grist, MD   1 Units at 10/20/16 1216  . insulin aspart (novoLOG) injection 3 Units  3 Units Subcutaneous TID WC Theodoro Grist, MD   3 Units at 10/20/16 1216  . ondansetron (ZOFRAN) tablet 4 mg  4 mg Oral Q6H PRN Theodoro Grist, MD   4 mg at 10/17/16 1205   Or  . ondansetron (ZOFRAN) injection 4 mg  4 mg Intravenous Q6H PRN Theodoro Grist, MD      . paliperidone (INVEGA) 24 hr tablet 3 mg  3 mg Oral Daily Theodoro Grist, MD   3 mg at 10/20/16 0907  . sodium chloride flush (NS) 0.9 % injection 3 mL  3 mL Intravenous Q12H Theodoro Grist, MD   3 mL at 10/18/16 2116  . venlafaxine XR (EFFEXOR-XR) 24 hr capsule 75 mg  75 mg Oral Q breakfast Clapacs, Madie Reno, MD   75 mg at 10/20/16 0907    Musculoskeletal: Strength &  Muscle Tone: decreased Gait & Station: unsteady Patient leans: N/A  Psychiatric Specialty Exam: Physical Exam  Nursing note and vitals reviewed. Constitutional: She appears well-developed and well-nourished.  HENT:  Head: Normocephalic and atraumatic.  Eyes: Conjunctivae are normal. Pupils are equal, round, and reactive to light.  Neck: Normal range of motion.  Cardiovascular: Regular rhythm and normal heart sounds.   Respiratory: Effort normal. No respiratory distress.  GI: Soft.  Musculoskeletal: Normal range of motion.  Neurological: She is alert.  Skin: Skin is warm and dry.  Psychiatric: She has a normal mood and affect. Her behavior is normal. Judgment and thought content normal.    Review of Systems  Constitutional: Negative.   HENT: Negative.   Eyes: Negative.   Respiratory: Negative.   Cardiovascular: Negative.   Gastrointestinal: Negative.   Musculoskeletal: Negative.   Skin: Negative.   Neurological: Negative.   Psychiatric/Behavioral: Negative for depression, hallucinations, memory loss, substance abuse and suicidal ideas. The patient is not nervous/anxious and does not have insomnia.     Blood  pressure (!) 164/82, pulse 95, temperature 97.5 F (36.4 C), temperature source Oral, resp. rate 14, height 5' (1.524 m), weight 89.5 kg (197 lb 6.4 oz), SpO2 96 %.Body mass index is 38.55 kg/m.  General Appearance: Fairly Groomed  Eye Contact:  Good  Speech:  Clear and Coherent  Volume:  Normal  Mood:  Euthymic  Affect:  Congruent  Thought Process:  Goal Directed  Orientation:  Full (Time, Place, and Person)  Thought Content:  Logical  Suicidal Thoughts:  No  Homicidal Thoughts:  No  Memory:  Immediate;   Good Recent;   Good Remote;   Good  Judgement:  Fair  Insight:  Fair  Psychomotor Activity:  Decreased  Concentration:  Concentration: Fair  Recall:  [employer] of Knowledge:  Fair  Language:  Fair  Akathisia:  No  Handed:  Right  AIMS (if indicated):      Assets:  Desire for Improvement Housing Resilience Social Support  ADL's:  Intact  Cognition:  WNL  Sleep:        Treatment Plan Summary: Daily contact with patient to assess and evaluate symptoms and progress in treatment, Medication management and Plan Patient can follow-up with outpatient provider for psychiatric treatment. Acute psychiatric symptoms resolved. No need to change medicine any further. Patient agrees to plan.  Disposition: Patient does not meet criteria for psychiatric inpatient admission. Supportive therapy provided about ongoing stressors.  Alethia Berthold, MD 10/20/2016 4:32 PM

## 2016-10-20 NOTE — Evaluation (Signed)
Physical Therapy Evaluation Patient Details Name: Erika Jacobs MRN: [id number] DOB: 12/09/1950 Today's Date: 10/20/2016   History of Present Illness  Pt is a 66 yo F admitted to acute care with bipolar 1 disorder, depression, and psychotic features. Prior to admission pt independent with all ADL's/IADL's, with no AD. PMH includes: suicidal ideation, diabetes, HTN, urinary incontinence, dyslipidemia, GERD, psychosis, SOB, hyperthyroidism, chronic HA, chronic constipation, diabetic polyneuropathy, paranoia, panic attack, arthritis, anxiety, anemia, CKD, gastritis, and duodenitis.   Clinical Impression  Pt is a pleasant 66 y.o. F, admitted to acute care for bipolar 1 disorder, depression, and psychotic features. Pt performs bed mobility and tranfers at mod I level, due to impulsivity and intermittent c/o slight dizziness when in upright position. Pt currently utilizing RW with transfers and requires HOB elevated and bed rail with supine to sit bed mobility. Ambulation performed with CGA and use of RW, due to intermittent c/o of dizziness. Pt able to amb total of 240 ft, with bathroom break after 20 ft. Pt reported RPE score of 1/10, using Mod BORG scale, indicating good levels of cardiorespiratory endurance. Gait speed of 1.25 ft/sec does indicate increased risk of falls with community ambulation. Pt performed 5x STS with no hands in 20.39 sec, indicating reduced muscular endurance and power in B LE's. Pt presents with the following deficits: strength and endurance. Overall, pt responded well to today's treatment with no adverse affects. Pt would benefit from skilled PT to address the previously mentioned impairments and promote return to PLOF. Currently recommending outpatient PT, pending d/c.     Follow Up Recommendations Outpatient PT    Equipment Recommendations  Rolling walker with 5" wheels    Recommendations for Other Services       Precautions / Restrictions Precautions Precautions:  None Restrictions Weight Bearing Restrictions: No      Mobility  Bed Mobility Overal bed mobility: Needs Assistance Bed Mobility: Supine to Sit     Supine to sit: Modified independent (Device/Increase time)     General bed mobility comments: ModI with bed mobility due to impulsivity. HOB raised and bed rail used.   Transfers Overall transfer level: Needs assistance Equipment used: Rolling walker (2 wheeled) Transfers: Sit to/from Stand Sit to Stand: Min guard         General transfer comment: ModI with STS due to impulsivity. Utilized RW with STS's due to reports of intermittent dizziness.   Ambulation/Gait Ambulation/Gait assistance: Min guard Ambulation Distance (Feet): 240 Feet Assistive device: Rolling walker (2 wheeled) Gait Pattern/deviations: Step-through pattern Gait velocity: 1.25 Gait velocity interpretation: <1.8 ft/sec, indicative of risk for recurrent falls General Gait Details: CGA with amb using RW due to intermittent reports of dizziness. Pt demo good reciprical gait pattern and excellent endurance, reporting RPE 1/10 after amb 220 ft.   Stairs            Wheelchair Mobility    Modified Rankin (Stroke Patients Only)       Balance Overall balance assessment: Needs assistance Sitting-balance support: No upper extremity supported;Feet supported Sitting balance-Leahy Scale: Good Sitting balance - Comments: Excellent sitting balance, requiring no cues or assist.    Standing balance support: Bilateral upper extremity supported Standing balance-Leahy Scale: Good Standing balance comment: Good standing balance with B UE suppot. Intermittent reports of dizziness with standing, requiring supervision.                              Pertinent Vitals/Pain  Pain Assessment: No/denies pain    Home Living Family/patient expects to be discharged to:: Private residence Living Arrangements: Alone Available Help at Discharge:  Friend(s);Family Type of Home: House Home Access: Stairs to enter Entrance Stairs-Rails: None Entrance Stairs-Number of Steps: 1 Home Layout: One level Home Equipment: None Additional Comments: Required no AD prior to admission.     Prior Function Level of Independence: Independent               Hand Dominance        Extremity/Trunk Assessment   Upper Extremity Assessment Upper Extremity Assessment: Overall WFL for tasks assessed    Lower Extremity Assessment Lower Extremity Assessment: Generalized weakness (MMT to B LE's grossly 4+/5)       Communication   Communication: No difficulties  Cognition Arousal/Alertness: Awake/alert Behavior During Therapy: WFL for tasks assessed/performed Overall Cognitive Status: Within Functional Limits for tasks assessed                                 General Comments: Impulsive.       General Comments      Exercises Other Exercises Other Exercises: Supine therex to B LE's with supervision: ankle pumps x15 and SLR x5 Other Exercises: Searted therex performed with supervision to B LE's x10: marches, LAQ's, glute sets Other Exercises: toileting: perofrmed all toileting tasks with supervision, due to c/o dizziness: clothing management, hygiene, and transfers.    Assessment/Plan    PT Assessment Patient needs continued PT services  PT Problem List Decreased strength;Decreased balance;Decreased mobility;Decreased safety awareness       PT Treatment Interventions DME instruction;Gait training;Stair training;Functional mobility training;Therapeutic activities;Therapeutic exercise;Balance training;Neuromuscular re-education;Patient/family education    PT Goals (Current goals can be found in the Care Plan section)  Acute Rehab PT Goals Patient Stated Goal: to go home  PT Goal Formulation: With patient Time For Goal Achievement: 11/03/16 Potential to Achieve Goals: Good Additional Goals Additional Goal #1: Pt  gait speed to improve to 1.8 ft/sec with LRAD for return to PLOF.    Frequency Min 2X/week   Barriers to discharge        Co-evaluation               AM-PAC PT "6 Clicks" Daily Activity  Outcome Measure Difficulty turning over in bed (including adjusting bedclothes, sheets and blankets)?: A Little Difficulty moving from lying on back to sitting on the side of the bed? : A Little Difficulty sitting down on and standing up from a chair with arms (e.g., wheelchair, bedside commode, etc,.)?: A Little Help needed moving to and from a bed to chair (including a wheelchair)?: A Little Help needed walking in hospital room?: A Little Help needed climbing 3-5 steps with a railing? : A Little 6 Click Score: 18    End of Session Equipment Utilized During Treatment: Gait belt Activity Tolerance: Patient tolerated treatment well Patient left: in chair;with call bell/phone within reach;with bed alarm set;with chair alarm set Nurse Communication: Mobility status PT Visit Diagnosis: Unsteadiness on feet (R26.81);Muscle weakness (generalized) (M62.81)    Time: 3559-7416 PT Time Calculation (min) (ACUTE ONLY): 20 min   Charges:   PT Evaluation $PT Eval Low Complexity: 1 Procedure PT Treatments $Therapeutic Exercise: 8-22 mins   PT G Codes:        Erika Jacobs PT, SPT  Bevelyn Ngo 10/20/2016, 11:49 AM

## 2016-10-20 NOTE — Care Management (Addendum)
Admitted to this facility with the diagnosis of bipolar and acute renal disease. Lives alone. Son is Amil Amen [phone]). Prescriptions are filled at [facility] in [place]. No home health. No skilled facility. No home oxygen. Rolling walker in the home, if needed. Takes care of all basic and instrumental activities of daily living herself, drives. No falls. Decreased appetite for a couple of weeks. Lost 10-15 pounds, Family will transport Physical therapy evaluation completed. Recommending outpatient therapy. Discharge 10/21/16 per Dr. Brantley Fling RN MSN CCM Care Management [phone]

## 2016-10-20 NOTE — Progress Notes (Signed)
MEDICATION RELATED CONSULT NOTE - follow up  Pharmacy Consult for Adjust medications for severe renal failure Indication: severe renal failure  Allergies  Allergen Reactions  . Aripiprazole Other (See Comments)    Pt shakes when taking this medication.  Lorayne Bender [Paliperidone]     High doses causes patient to shake  . Lisinopril Other (See Comments)    Kidney   . Risperidone And Related     hallucinations    Patient Measurements: Height: 5' (152.4 cm) Weight: 197 lb 6.4 oz (89.5 kg) IBW/kg (Calculated) : 45.5   Vital Signs: Temp: 97.8 F (36.6 C) (07/02 0346) Temp Source: Oral (07/02 0346) BP: 165/85 (07/02 0346) Pulse Rate: 82 (07/02 0500) Intake/Output from previous day: 07/01 0701 - 07/02 0700 In: -  Out: 5050 [Urine:5050] Intake/Output from this shift: No intake/output data recorded.  Labs:  Recent Labs  10/18/16 0436 10/18/16 1800 10/19/16 0456 10/20/16 0509  WBC 6.8  --   --   --   HGB 9.7*  --   --   --   HCT 28.6*  --   --   --   PLT 142*  --   --   --   CREATININE 5.50*  --  4.26* 3.47*  LABCREA  --  16  --   --   MG 2.7*  --   --   --   PHOS 4.6  --   --   --    Estimated Creatinine Clearance: 16.3 mL/min (A) (by C-G formula based on SCr of 3.47 mg/dL (H)).  Assessment: 66 yo female in acute renal failure.  Patient received temp. Dialysis x 2 on 6/29, 6/30.  No further indication for dialysis at the moment per note 10/19/16.  Goal of Therapy:  Renally dose adjusted medications  Plan:  Current medications reviewed. No dose adjustment needed at this time.   Pharmacy will continue to follow.   Joniqua Sidle A 10/20/2016,9:34 AM

## 2016-10-20 NOTE — Care Management Important Message (Signed)
Important Message  Patient Details  Name: Erika Jacobs MRN: [id number] Date of Birth: [birth year]   Medicare Important Message Given:  Yes    Shelbie Ammons, RN 10/20/2016, 10:33 AM

## 2016-10-20 NOTE — Progress Notes (Signed)
[facility] NAME: Erika Jacobs    MR#:  [id number]  DATE OF BIRTH:  December 19, 1950  SUBJECTIVE:   Patient here due to acute renal failure now requiring hemodialysis. Renal function is much improved, And made about 5 L of urine yesterday. Dialysis catheter has been removed. Ambulating now.  REVIEW OF SYSTEMS:    Review of Systems  Constitutional: Negative for chills and fever.  HENT: Negative for congestion and tinnitus.   Eyes: Negative for blurred vision and double vision.  Respiratory: Negative for cough, shortness of breath and wheezing.   Cardiovascular: Negative for chest pain, orthopnea and PND.  Gastrointestinal: Negative for abdominal pain, diarrhea, nausea and vomiting.  Genitourinary: Negative for dysuria and hematuria.  Neurological: Negative for dizziness, sensory change and focal weakness.  All other systems reviewed and are negative.   Nutrition: Renal diet Tolerating Diet: Yes Tolerating PT: Evaluation noted.   DRUG ALLERGIES:   Allergies  Allergen Reactions  . Aripiprazole Other (See Comments)    Pt shakes when taking this medication.  Lorayne Bender [Paliperidone]     High doses causes patient to shake  . Lisinopril Other (See Comments)    Kidney   . Risperidone And Related     hallucinations    VITALS:  Blood pressure (!) 164/82, pulse 95, temperature 97.5 F (36.4 C), temperature source Oral, resp. rate 14, height 5' (1.524 m), weight 89.5 kg (197 lb 6.4 oz), SpO2 96 %.  PHYSICAL EXAMINATION:   Physical Exam  GENERAL:  66 y.o.-year-old obese patient lying in bed in no acute distress.  EYES: Pupils equal, round, reactive to light and accommodation. No scleral icterus. Extraocular muscles intact.  HEENT: Head atraumatic, normocephalic. Oropharynx and nasopharynx clear.  NECK:  Supple, no jugular venous distention. No thyroid enlargement, no tenderness.  LUNGS: Normal breath sounds bilaterally, no  wheezing, rales, rhonchi. No use of accessory muscles of respiration.  CARDIOVASCULAR: S1, S2 normal. No murmurs, rubs, or gallops.  ABDOMEN: Soft, nontender, nondistended. Bowel sounds present. No organomegaly or mass.  EXTREMITIES: No cyanosis, clubbing or edema b/l.    NEUROLOGIC: Cranial nerves II through XII are intact. No focal Motor or sensory deficits b/l.   PSYCHIATRIC: The patient is alert and oriented x 3.  SKIN: No obvious rash, lesion, or ulcer.     LABORATORY PANEL:   CBC  Recent Labs Lab 10/18/16 0436  WBC 6.8  HGB 9.7*  HCT 28.6*  PLT 142*   ------------------------------------------------------------------------------------------------------------------  Chemistries   Recent Labs Lab 10/14/16 1448  10/18/16 0436  10/20/16 0509  NA 136  < > 124*  < > 133*  K 4.7  < > 4.4  < > 3.8  CL 103  < > 94*  < > 101  CO2 23  < > 22  < > 24  GLUCOSE 197*  < > 75  < > 155*  BUN 29*  < > 61*  < > 26*  CREATININE 1.29*  < > 5.50*  < > 3.47*  CALCIUM 10.6*  < > 7.4*  < > 7.7*  MG  --   < > 2.7*  --   --   AST 22  --   --   --   --   ALT 24  --   --   --   --   ALKPHOS 52  --   --   --   --   BILITOT 0.6  --   --   --   --   < > =  values in this interval not displayed. ------------------------------------------------------------------------------------------------------------------  Cardiac Enzymes  Recent Labs Lab 10/18/16 0005  TROPONINI 0.04*   ------------------------------------------------------------------------------------------------------------------  RADIOLOGY:  No results found.   ASSESSMENT AND PLAN:   66 year old female with past medical history of hypertension, hypothyroidism, bipolar disorder, osteoarthritis, diabetes, GERD who was admitted to the behavioral medicine for suicidal ideations and transferred to the medical service due to acute renal failure with oliguria.  1. Acute renal failure- suspected to be pre-renal and related to  dehydration. -Seen by nephrology and started on hemodialysis and s/p 2 treatments. Cr. Improved and now making good urine.  -Serologic workup has been initiated and still pending.  - Cr trending down and if continues to improve then d/c home tomorrow.   2. Metabolic acidosis-secondary to the acute kidney injury.  - resolved w/ HD.   3. Hyponatremia-also secondary to her acute kidney injury.  - improved w/ fluids and as pt's renal function has improved.   4. History of depression with suicidal ideations-currently denies any suicidal or homicidal ideations. - cont. Invega.  Seen by Psych and IVC/sitter d/c.   - cont. Effexor  5. DM - cont. SSI and BS stable.   PT eval noted and if renal function continues to improve tomorrow then d/c home   All the records are reviewed and case discussed with Care Management/Social Worker. Management plans discussed with the patient, family and they are in agreement.  CODE STATUS: Full code  DVT Prophylaxis: Hep. SQ  TOTAL TIME TAKING CARE OF THIS PATIENT: 30 minutes.   POSSIBLE D/C IN 1-2 DAYS, DEPENDING ON CLINICAL CONDITION.   Henreitta Leber M.D on 10/20/2016 at 3:00 PM  Between 7am to 6pm - Pager - [phone]  After 6pm go to [email or web address] - Technical brewer [facility]  Office  [phone]  CC: Primary care physician; Monico Blitz, MD

## 2016-10-20 NOTE — Progress Notes (Signed)
[facility]  ROUNDING NOTE   Subjective:   Sitting in chair. No IV.   Patient states she is not eating much but has no other complaints.   Dialysis catheter removed.   Objective:  Vital signs in last 24 hours:  Temp:  [97.4 F (36.3 C)-98.4 F (36.9 C)] 97.8 F (36.6 C) (07/02 0346) Pulse Rate:  [82-131] 82 (07/02 0500) Resp:  [14-18] 18 (07/02 0346) BP: (140-187)/(79-88) 165/85 (07/02 0346) SpO2:  [95 %-98 %] 98 % (07/02 0346) Weight:  [89.5 kg (197 lb 6.4 oz)] 89.5 kg (197 lb 6.4 oz) (07/02 0346)  Weight change: -2.994 kg (-6 lb 9.6 oz) Filed Weights   10/18/16 1448 10/19/16 0335 10/20/16 0346  Weight: 93.9 kg (207 lb) 93.4 kg (206 lb) 89.5 kg (197 lb 6.4 oz)    Intake/Output: I/O last 3 completed shifts: In: 2521.7 [I.V.:2521.7] Out: 8850 [Urine:8850]   Intake/Output this shift:  No intake/output data recorded.  Physical Exam: General: No acute distress  Head: Normocephalic, atraumatic. Moist oral mucosal membranes  Eyes: Anicteric  Neck: Supple, trachea midline  Lungs:  Clear to auscultation, normal effort  Heart: S1S2 no rubs  Abdomen:  Soft, nontender, bowel sounds present  Extremities: No peripheral edema.  Neurologic: Awake, alert, following commands, flat affect  Skin: No lesions  Access: none    Basic Metabolic Panel:  Recent Labs Lab 10/16/16 2002 10/17/16 0822 10/18/16 0436 10/19/16 0456 10/20/16 0509  NA 127* 123* 124* 131* 133*  K 5.0 4.8 4.4 4.2 3.8  CL 96* 90* 94* 98* 101  CO2 15* 12* 22 25 24   GLUCOSE 114* 109* 75 124* 155*  BUN 73* 81* 61* 31* 26*  CREATININE 5.78* 6.53* 5.50* 4.26* 3.47*  CALCIUM 8.9 8.3* 7.4* 7.3* 7.7*  MG  --  1.4* 2.7*  --   --   PHOS  --  7.4* 4.6  --   --     Liver Function Tests:  Recent Labs Lab 10/14/16 1448  AST 22  ALT 24  ALKPHOS 52  BILITOT 0.6  PROT 8.7*  ALBUMIN 4.6   No results for input(s): LIPASE, AMYLASE in the last 168 hours. No results for input(s): AMMONIA in the  last 168 hours.  CBC:  Recent Labs Lab 10/14/16 1448 10/16/16 2002 10/18/16 0436  WBC 8.8 11.1* 6.8  HGB 13.4 12.3 9.7*  HCT 41.0 38.3 28.6*  MCV 80.3 82.3 78.1*  PLT 238 183 142*    Cardiac Enzymes:  Recent Labs Lab 10/17/16 0822 10/17/16 1927 10/18/16 0005  CKTOTAL  --  106  --   TROPONINI <0.03 0.05* 0.04*    BNP: Invalid input(s): POCBNP  CBG:  Recent Labs Lab 10/19/16 0734 10/19/16 1134 10/19/16 1639 10/19/16 2042 10/20/16 0722  GLUCAP 127* 254* 157* 145* 133*    Microbiology: Results for orders placed or performed during the hospital encounter of 10/17/16  Urine culture     Status: Abnormal (Preliminary result)   Collection Time: 10/18/16  6:00 PM  Result Value Ref Range Status   Specimen Description URINE, RANDOM  Final   Special Requests NONE  Final   Culture 40,000 COLONIES/mL GRAM NEGATIVE RODS (A)  Final   Report Status PENDING  Incomplete    Coagulation Studies:  Recent Labs  10/17/16 1326  LABPROT 13.3  INR 1.01    Urinalysis:  Recent Labs  10/18/16 1800  COLORURINE STRAW*  LABSPEC 1.003*  PHURINE 7.0  GLUCOSEU NEGATIVE  HGBUR MODERATE*  BILIRUBINUR NEGATIVE  KETONESUR NEGATIVE  PROTEINUR NEGATIVE  NITRITE NEGATIVE  LEUKOCYTESUR SMALL*      Imaging: No results found.   Medications:   . sodium chloride 50 mL/hr at 10/19/16 0958   . heparin  5,000 Units Subcutaneous Q8H  . insulin aspart  0-5 Units Subcutaneous QHS  . insulin aspart  0-9 Units Subcutaneous TID WC  . insulin aspart  3 Units Subcutaneous TID WC  . paliperidone  3 mg Oral Daily  . sodium chloride flush  3 mL Intravenous Q12H  . venlafaxine XR  75 mg Oral Q breakfast   acetaminophen **OR** acetaminophen, ondansetron **OR** ondansetron (ZOFRAN) IV  Assessment/ Plan:  66 y.o. white female with Osteoarthritis, bipolar 1 disorder, chronic kidney disease stage III baseline EGFR 51, diabetes mellitus type 2, GERD, hyperlipidemia, hypertension,  hypothyroidism, paranoia   1. Acute Renal Failure 2. Metabolic Acidosis 3. Chronic Kidney Disease stage III 4. Hyponatremia 5. Proteinuria 6. Hematuria  Acute renal failure with metabolic acidosis and hyponatremia on chronic kidney disease stage III with proteinuria. Baseline creatinine 1.29, GFR of 50 on 10/14/16.  Acute renal failure unclear etiology. Required two treatments of hemodialysis.  Salicylate level negative, urine tox screen negative, urinalysis with hemoglobin, protein and leukocytes. No crystals.  Unclear etiology of acute renal failure.  - Hold IV fluids - Encourage PO intake.  - No indication for further dialysis.    LOS: [place], Dana 7/2/201810:46 AM

## 2016-10-21 LAB — RENAL FUNCTION PANEL
ALBUMIN: 3.3 g/dL — AB (ref 3.5–5.0)
ANION GAP: 4 — AB (ref 5–15)
BUN: 22 mg/dL — ABNORMAL HIGH (ref 6–20)
CHLORIDE: 105 mmol/L (ref 101–111)
CO2: 27 mmol/L (ref 22–32)
Calcium: 8.4 mg/dL — ABNORMAL LOW (ref 8.9–10.3)
Creatinine, Ser: 2.24 mg/dL — ABNORMAL HIGH (ref 0.44–1.00)
GFR calc Af Amer: 25 mL/min — ABNORMAL LOW (ref >60)
GFR calc non Af Amer: 22 mL/min — ABNORMAL LOW (ref >60)
GLUCOSE: 125 mg/dL — AB (ref 65–99)
PHOSPHORUS: 3.2 mg/dL (ref 2.5–4.6)
POTASSIUM: 4.1 mmol/L (ref 3.5–5.1)
Sodium: 136 mmol/L (ref 135–145)

## 2016-10-21 LAB — GLUCOSE, CAPILLARY
GLUCOSE-CAPILLARY: 145 mg/dL — AB (ref 65–99)
Glucose-Capillary: 135 mg/dL — ABNORMAL HIGH (ref 65–99)

## 2016-10-21 LAB — VOLATILES,BLD-ACETONE,ETHANOL,ISOPROP,METHANOL
Acetone, blood: NEGATIVE % (ref 0.000–0.010)
ETHANOL, BLOOD: NEGATIVE % (ref 0.000–0.010)
ISOPROPANOL, BLOOD: NEGATIVE % (ref 0.000–0.010)
METHANOL, BLOOD: NEGATIVE % (ref 0.000–0.010)

## 2016-10-21 LAB — CBC
HEMATOCRIT: 31.6 % — AB (ref 35.0–47.0)
HEMOGLOBIN: 10.5 g/dL — AB (ref 12.0–16.0)
MCH: 26.4 pg (ref 26.0–34.0)
MCHC: 33.1 g/dL (ref 32.0–36.0)
MCV: 79.7 fL — AB (ref 80.0–100.0)
Platelets: 179 10*3/uL (ref 150–440)
RBC: 3.97 MIL/uL (ref 3.80–5.20)
RDW: 16 % — ABNORMAL HIGH (ref 11.5–14.5)
WBC: 6.6 10*3/uL (ref 3.6–11.0)

## 2016-10-21 LAB — URINE CULTURE

## 2016-10-21 LAB — ETHYLENE GLYCOL: Ethylene Glycol Lvl: NOT DETECTED mg/dL

## 2016-10-21 MED ORDER — VENLAFAXINE HCL ER 75 MG PO CP24: 75.0000 mg | ORAL_CAPSULE | Freq: Every day | ORAL | 0 refills | Status: AC

## 2016-10-21 MED ORDER — AMLODIPINE BESYLATE 5 MG PO TABS: 5.0000 mg | ORAL_TABLET | Freq: Every day | ORAL | 1 refills | Status: AC

## 2016-10-21 NOTE — Progress Notes (Signed)
[facility]  ROUNDING NOTE   Subjective:   Sitting in chair.   Creatinine 2.24 (3.47)  Objective:  Vital signs in last 24 hours:  Temp:  [97.5 F (36.4 C)-98.3 F (36.8 C)] 98.3 F (36.8 C) (07/03 0831) Pulse Rate:  [88-102] 100 (07/03 0831) Resp:  [14-19] 19 (07/03 0831) BP: (152-164)/(71-82) 163/77 (07/03 0831) SpO2:  [95 %-96 %] 95 % (07/03 0831) Weight:  [89.4 kg (197 lb 3.2 oz)] 89.4 kg (197 lb 3.2 oz) (07/03 0526)  Weight change: -0.091 kg (-3.2 oz) Filed Weights   10/19/16 0335 10/20/16 0346 10/21/16 0526  Weight: 93.4 kg (206 lb) 89.5 kg (197 lb 6.4 oz) 89.4 kg (197 lb 3.2 oz)    Intake/Output: I/O last 3 completed shifts: In: -  Out: 4700 [Urine:4700]   Intake/Output this shift:  No intake/output data recorded.  Physical Exam: General: No acute distress  Head: Normocephalic, atraumatic. Moist oral mucosal membranes  Eyes: Anicteric  Neck: Supple, trachea midline  Lungs:  Clear to auscultation, normal effort  Heart: S1S2 no rubs  Abdomen:  Soft, nontender, bowel sounds present  Extremities: No peripheral edema.  Neurologic: Awake, alert, following commands, flat affect  Skin: No lesions  Access: none    Basic Metabolic Panel:  Recent Labs Lab 10/17/16 0822 10/18/16 0436 10/19/16 0456 10/20/16 0509 10/21/16 0518  NA 123* 124* 131* 133* 136  K 4.8 4.4 4.2 3.8 4.1  CL 90* 94* 98* 101 105  CO2 12* 22 25 24 27   GLUCOSE 109* 75 124* 155* 125*  BUN 81* 61* 31* 26* 22*  CREATININE 6.53* 5.50* 4.26* 3.47* 2.24*  CALCIUM 8.3* 7.4* 7.3* 7.7* 8.4*  MG 1.4* 2.7*  --   --   --   PHOS 7.4* 4.6  --   --  3.2    Liver Function Tests:  Recent Labs Lab 10/14/16 1448 10/21/16 0518  AST 22  --   ALT 24  --   ALKPHOS 52  --   BILITOT 0.6  --   PROT 8.7*  --   ALBUMIN 4.6 3.3*   No results for input(s): LIPASE, AMYLASE in the last 168 hours. No results for input(s): AMMONIA in the last 168 hours.  CBC:  Recent Labs Lab  10/14/16 1448 10/16/16 2002 10/18/16 0436 10/21/16 0518  WBC 8.8 11.1* 6.8 6.6  HGB 13.4 12.3 9.7* 10.5*  HCT 41.0 38.3 28.6* 31.6*  MCV 80.3 82.3 78.1* 79.7*  PLT 238 183 142* 179    Cardiac Enzymes:  Recent Labs Lab 10/17/16 0822 10/17/16 1927 10/18/16 0005  CKTOTAL  --  106  --   TROPONINI <0.03 0.05* 0.04*    BNP: Invalid input(s): POCBNP  CBG:  Recent Labs Lab 10/20/16 0722 10/20/16 1124 10/20/16 1637 10/20/16 2103 10/21/16 0724  GLUCAP 133* 129* 166* 153* 135*    Microbiology: Results for orders placed or performed during the hospital encounter of 10/17/16  Urine culture     Status: Abnormal   Collection Time: 10/18/16  6:00 PM  Result Value Ref Range Status   Specimen Description URINE, RANDOM  Final   Special Requests NONE  Final   Culture 40,000 COLONIES/mL ESCHERICHIA COLI (A)  Final   Report Status 10/21/2016 FINAL  Final   Organism ID, Bacteria ESCHERICHIA COLI (A)  Final      Susceptibility   Escherichia coli - MIC*    AMPICILLIN <=2 SENSITIVE Sensitive     CEFAZOLIN <=4 SENSITIVE Sensitive     CEFTRIAXONE <=  1 SENSITIVE Sensitive     CIPROFLOXACIN <=0.25 SENSITIVE Sensitive     GENTAMICIN >=16 RESISTANT Resistant     IMIPENEM <=0.25 SENSITIVE Sensitive     NITROFURANTOIN <=16 SENSITIVE Sensitive     TRIMETH/SULFA <=20 SENSITIVE Sensitive     AMPICILLIN/SULBACTAM <=2 SENSITIVE Sensitive     PIP/TAZO <=4 SENSITIVE Sensitive     Extended ESBL NEGATIVE Sensitive     * 40,000 COLONIES/mL ESCHERICHIA COLI    Coagulation Studies: No results for input(s): LABPROT, INR in the last 72 hours.  Urinalysis:  Recent Labs  10/18/16 1800  COLORURINE STRAW*  LABSPEC 1.003*  PHURINE 7.0  GLUCOSEU NEGATIVE  HGBUR MODERATE*  BILIRUBINUR NEGATIVE  KETONESUR NEGATIVE  PROTEINUR NEGATIVE  NITRITE NEGATIVE  LEUKOCYTESUR SMALL*      Imaging: No results found.   Medications:    . heparin  5,000 Units Subcutaneous Q8H  . insulin aspart   0-5 Units Subcutaneous QHS  . insulin aspart  0-9 Units Subcutaneous TID WC  . insulin aspart  3 Units Subcutaneous TID WC  . paliperidone  3 mg Oral Daily  . sodium chloride flush  3 mL Intravenous Q12H  . venlafaxine XR  75 mg Oral Q breakfast   acetaminophen **OR** acetaminophen, ondansetron **OR** ondansetron (ZOFRAN) IV  Assessment/ Plan:  66 y.o. white female with Osteoarthritis, bipolar 1 disorder, chronic kidney disease stage III baseline EGFR 51, diabetes mellitus type 2, GERD, hyperlipidemia, hypertension, hypothyroidism, paranoia   1. Acute Renal Failure 2. Metabolic Acidosis 3. Chronic Kidney Disease stage III 4. Hyponatremia 5. Proteinuria 6. Hematuria  Acute renal failure with metabolic acidosis and hyponatremia on chronic kidney disease stage III with proteinuria. Baseline creatinine 1.29, GFR of 50 on 10/14/16.  Acute renal failure unclear etiology. Required two treatments of hemodialysis.  Salicylate level negative, urine tox screen negative, urinalysis with hemoglobin, protein and leukocytes. No crystals.  Unclear etiology of acute renal failure.  - Hold IV fluids - Encourage PO intake.  - No indication for further dialysis.   - Needs outpatient follow up with nephrology. Schedule appointment in 1-2 weeks with Dr. Holley Raring.    LOS: [place], Jensyn Cambria 7/3/201810:58 AM

## 2016-10-21 NOTE — Progress Notes (Signed)
PT Cancellation Note  Patient Details Name: Erika Jacobs MRN: [id number] DOB: 08-24-1950   Cancelled Treatment:    Reason Eval/Treat Not Completed: Patient declined, no reason specified   Pt offered session this am.  She refused stating she was awaiting discharge.  Stated she has been up to bathroom without difficulty and stated she is comfortable with her mobility.  She declined encouragement to ambulate around unit.   Chesley Noon 10/21/2016, 10:05 AM

## 2016-10-21 NOTE — Care Management (Signed)
Discharge to home today per Dr, Verdell Carmine.  Discussed outpatient services for physical therapy. "I think I'II be fine." Ms. Phillis lives in [place]. Discussed they if she thinks she will need outpatient therapy to contact her primary care physician. Son is transporting Erika Ammons RN MSN [facility] [phone]

## 2016-10-21 NOTE — Discharge Summary (Signed)
[facility] NAME: Erika Jacobs    MR#:  [id number]  DATE OF BIRTH:  12/19/1950  DATE OF ADMISSION:  10/17/2016 ADMITTING PHYSICIAN: Theodoro Grist, MD  DATE OF DISCHARGE: 10/21/2016 12:40 PM  PRIMARY CARE PHYSICIAN: Monico Blitz, MD    ADMISSION DIAGNOSIS:  chronic kidney disease ESRD  DISCHARGE DIAGNOSIS:  Principal Problem:   Bipolar I disorder, severe, current or most recent episode depressed, with psychotic features ([place]) Active Problems:   Acute renal failure (ARF) (HCC)   Hypotension   Tachycardia   Leukocytosis   SECONDARY DIAGNOSIS:   Past Medical History:  Diagnosis Date  . Anemia   . Anxiety   . Arthritis   . Bipolar 1 disorder ([place])   . Chronic constipation   . Chronic headache   . CKD (chronic kidney disease)   . Diabetes mellitus without complication ([place])   . Diabetic polyneuropathy ([place])   . Gastritis and duodenitis   . GERD (gastroesophageal reflux disease)   . Hypercholesteremia   . Hypertension   . Hypothyroid   . Overactive bladder   . Panic attack   . Paranoia ([place])   . Psychosis   . Shortness of breath   . Thought disorder     HOSPITAL COURSE:   66 year old female with past medical history of hypertension, hypothyroidism, bipolar disorder, osteoarthritis, diabetes, GERD who was admitted to the behavioral medicine for suicidal ideations and transferred to the medical service due to acute renal failure with oliguria.  1. Acute renal failure- Patient was transferred from behavioral medicine to medical services due to acute kidney injury which was suspected to be secondary to severe dehydration and prerenal in nature. -A nephrology consult was obtained and since pt. was oliguric dialysis was initiated. Patient received 2 dialysis treatments and shortly after her creatinine started trending down and she started to make good urine. -Her creatinine has significantly improved now and she is  making good urine and therefore she is being discharged home with close follow-up with nephrology as an outpatient. Patient's losartan has been discontinued.  2. Metabolic acidosis-secondary to the acute kidney injury.  - This has improved and resolved with hemodialysis and as patient's renal function has improved.  3. Hyponatremia-also secondary to her acute kidney injury.  - improved w/ fluids and as pt's renal function has improved.   4. History of depression with suicidal ideations-patient was admitted to the medical service from behavioral medicine due to suicidal ideations. -A psychiatric consult was obtained and they think that the patient is presently not suicidal or homicidal. Patient's involuntary commitment has been lifted and her sitter has been discontinued. She will be discharged on her Invega and some Effexor with follow-up with psychiatry as an outpatient.  5. Essential HTN - pt. Will cont. Her Norvasc, Toprol.   6. DM - while in the Hospital pt. Was on SSI but will resume her [place] upon discharge.   DISCHARGE CONDITIONS:   Stable   CONSULTS OBTAINED:  Treatment Team:  Anthonette Legato, MD Schnier, Dolores Lory, MD Clapacs, Madie Reno, MD  DRUG ALLERGIES:   Allergies  Allergen Reactions  . Aripiprazole Other (See Comments)    Pt shakes when taking this medication.  Lorayne Bender [Paliperidone]     High doses causes patient to shake  . Lisinopril Other (See Comments)    Kidney   . Risperidone And Related     hallucinations    DISCHARGE MEDICATIONS:   Allergies as of  10/21/2016      Reactions   Aripiprazole Other (See Comments)   Pt shakes when taking this medication.   Invega [paliperidone]    High doses causes patient to shake   Lisinopril Other (See Comments)   Kidney    Risperidone And Related    hallucinations      Medication List    STOP taking these medications   losartan 50 MG tablet Commonly known as:  COZAAR     TAKE these medications    amLODipine 5 MG tablet Commonly known as:  NORVASC Take 1 tablet (5 mg total) by mouth daily. What changed:  medication strength  how much to take   aspirin EC 81 MG tablet Take 81 mg by mouth daily.   metoprolol succinate 50 MG 24 hr tablet Commonly known as:  TOPROL-XL Take 50 mg by mouth daily.   paliperidone 3 MG 24 hr tablet Commonly known as:  INVEGA Take 3 mg by mouth daily.   pravastatin 40 MG tablet Commonly known as:  PRAVACHOL Take 40 mg by mouth daily.   TRESIBA FLEXTOUCH 200 UNIT/ML Sopn Generic drug:  Insulin Degludec Inject 34 Units into the skin daily.   venlafaxine XR 75 MG 24 hr capsule Commonly known as:  EFFEXOR-XR Take 1 capsule (75 mg total) by mouth daily with breakfast. Start taking on:  10/22/2016         DISCHARGE INSTRUCTIONS:   DIET:  Cardiac diet  DISCHARGE CONDITION:  Stable  ACTIVITY:  Activity as tolerated  OXYGEN:  Home Oxygen: No.   Oxygen Delivery: room air  DISCHARGE LOCATION:  home   If you experience worsening of your admission symptoms, develop shortness of breath, life threatening emergency, suicidal or homicidal thoughts you must seek medical attention immediately by calling 911 or calling your MD immediately  if symptoms less severe.  You Must read complete instructions/literature along with all the possible adverse reactions/side effects for all the Medicines you take and that have been prescribed to you. Take any new Medicines after you have completely understood and accpet all the possible adverse reactions/side effects.   Please note  You were cared for by a hospitalist during your hospital stay. If you have any questions about your discharge medications or the care you received while you were in the hospital after you are discharged, you can call the unit and asked to speak with the hospitalist on call if the hospitalist that took care of you is not available. Once you are discharged, your primary care  physician will handle any further medical issues. Please note that NO REFILLS for any discharge medications will be authorized once you are discharged, as it is imperative that you return to your primary care physician (or establish a relationship with a primary care physician if you do not have one) for your aftercare needs so that they can reassess your need for medications and monitor your lab values.     Today   No acute events overnight. Creatinine down to 2.2 today. Making good urine. Will d/c home today.   VITAL SIGNS:  Blood pressure (!) 151/121, pulse 97, temperature 98.3 F (36.8 C), temperature source Oral, resp. rate 18, height 5' (1.524 m), weight 89.4 kg (197 lb 3.2 oz), SpO2 94 %.  I/O:   Intake/Output Summary (Last 24 hours) at 10/21/16 1600 Last data filed at 10/21/16 0530  Gross per 24 hour  Intake  0 ml  Output             1500 ml  Net            -1500 ml    PHYSICAL EXAMINATION:  GENERAL:  66 y.o.-year-old patient lying in the bed with no acute distress.  EYES: Pupils equal, round, reactive to light and accommodation. No scleral icterus. Extraocular muscles intact.  HEENT: Head atraumatic, normocephalic. Oropharynx and nasopharynx clear.  NECK:  Supple, no jugular venous distention. No thyroid enlargement, no tenderness.  LUNGS: Normal breath sounds bilaterally, no wheezing, rales,rhonchi. No use of accessory muscles of respiration.  CARDIOVASCULAR: S1, S2 normal. No murmurs, rubs, or gallops.  ABDOMEN: Soft, non-tender, non-distended. Bowel sounds present. No organomegaly or mass.  EXTREMITIES: No pedal edema, cyanosis, or clubbing.  NEUROLOGIC: Cranial nerves II through XII are intact. No focal motor or sensory defecits b/l.  PSYCHIATRIC: The patient is alert and oriented x 3.  SKIN: No obvious rash, lesion, or ulcer.   DATA REVIEW:   CBC  Recent Labs Lab 10/21/16 0518  WBC 6.6  HGB 10.5*  HCT 31.6*  PLT 179    Chemistries    Recent Labs Lab 10/18/16 0436  10/21/16 0518  NA 124*  < > 136  K 4.4  < > 4.1  CL 94*  < > 105  CO2 22  < > 27  GLUCOSE 75  < > 125*  BUN 61*  < > 22*  CREATININE 5.50*  < > 2.24*  CALCIUM 7.4*  < > 8.4*  MG 2.7*  --   --   < > = values in this interval not displayed.  Cardiac Enzymes  Recent Labs Lab 10/18/16 0005  TROPONINI 0.04*    Microbiology Results  Results for orders placed or performed during the hospital encounter of 10/17/16  Urine culture     Status: Abnormal   Collection Time: 10/18/16  6:00 PM  Result Value Ref Range Status   Specimen Description URINE, RANDOM  Final   Special Requests NONE  Final   Culture 40,000 COLONIES/mL ESCHERICHIA COLI (A)  Final   Report Status 10/21/2016 FINAL  Final   Organism ID, Bacteria ESCHERICHIA COLI (A)  Final      Susceptibility   Escherichia coli - MIC*    AMPICILLIN <=2 SENSITIVE Sensitive     CEFAZOLIN <=4 SENSITIVE Sensitive     CEFTRIAXONE <=1 SENSITIVE Sensitive     CIPROFLOXACIN <=0.25 SENSITIVE Sensitive     GENTAMICIN >=16 RESISTANT Resistant     IMIPENEM <=0.25 SENSITIVE Sensitive     NITROFURANTOIN <=16 SENSITIVE Sensitive     TRIMETH/SULFA <=20 SENSITIVE Sensitive     AMPICILLIN/SULBACTAM <=2 SENSITIVE Sensitive     PIP/TAZO <=4 SENSITIVE Sensitive     Extended ESBL NEGATIVE Sensitive     * 40,000 COLONIES/mL ESCHERICHIA COLI    RADIOLOGY:  No results found.    Management plans discussed with the patient, family and they are in agreement.  CODE STATUS:     Code Status Orders        Start     Ordered   10/17/16 1116  Full code  Continuous     10/17/16 1115          TOTAL TIME TAKING CARE OF THIS PATIENT: 40 minutes.    Henreitta Leber M.D on 10/21/2016 at 4:00 PM  Between 7am to 6pm - Pager - [phone]  After 6pm go to [email or web address] - [place]  [facility]  Office  [phone]  CC: Primary care physician; Monico Blitz, MD

## 2016-10-21 NOTE — Progress Notes (Signed)
Patient is alert and oriented, denies any pain at this time, vss, mood calm, patient is being discharge home at this time, discharge instruction provided, iv removed tele removed

## 2016-10-21 NOTE — Plan of Care (Signed)
Problem: Food- and Nutrition-Related Knowledge Deficit (NB-1.1) Goal: Nutrition education Formal process to instruct or train a patient/client in a skill or to impart knowledge to help patients/clients voluntarily manage or modify food choices and eating behavior to maintain or improve health. Outcome: Adequate for Discharge Nutrition Education Note  RD consulted for Renal Education. Provided CKD Stages 1-4 diet handout to patient/family. Reviewed food groups and provided written recommended serving sizes specifically determined for patient's current nutritional status.   Explained why diet restrictions are needed and provided lists of foods to limit/avoid that are high potassium, sodium, and phosphorus. Provided specific recommendations on safer alternatives of these foods. Strongly encouraged compliance of this diet.   Teach back method used.  Expect fair compliance.  Body mass index is 38.51 kg/m. Pt meets criteria for obese class II based on current BMI.  Current diet order is renal/carb mod, patient is consuming approximately 50-100% of meals at this time. Labs and medications reviewed. No further nutrition interventions warranted at this time. RD contact information provided. If additional nutrition issues arise, please re-consult RD.  Erika Jacobs. Erika Archbold, MS, RD LDN Inpatient Clinical Dietitian Pager [phone]

## 2016-10-27 DIAGNOSIS — N179 Acute kidney failure, unspecified: Secondary | ICD-10-CM | POA: Diagnosis not present

## 2016-10-27 DIAGNOSIS — R809 Proteinuria, unspecified: Secondary | ICD-10-CM | POA: Diagnosis not present

## 2016-10-27 DIAGNOSIS — E871 Hypo-osmolality and hyponatremia: Secondary | ICD-10-CM | POA: Diagnosis not present

## 2016-10-27 DIAGNOSIS — N183 Chronic kidney disease, stage 3 (moderate): Secondary | ICD-10-CM | POA: Diagnosis not present

## 2016-10-27 DIAGNOSIS — I10 Essential (primary) hypertension: Secondary | ICD-10-CM | POA: Diagnosis not present

## 2016-11-03 DIAGNOSIS — E1165 Type 2 diabetes mellitus with hyperglycemia: Secondary | ICD-10-CM | POA: Diagnosis not present

## 2016-11-03 DIAGNOSIS — Z299 Encounter for prophylactic measures, unspecified: Secondary | ICD-10-CM | POA: Diagnosis not present

## 2016-11-03 DIAGNOSIS — I10 Essential (primary) hypertension: Secondary | ICD-10-CM | POA: Diagnosis not present

## 2016-11-03 DIAGNOSIS — E1142 Type 2 diabetes mellitus with diabetic polyneuropathy: Secondary | ICD-10-CM | POA: Diagnosis not present

## 2016-11-03 DIAGNOSIS — E7800 Pure hypercholesterolemia, unspecified: Secondary | ICD-10-CM | POA: Diagnosis not present

## 2016-11-03 DIAGNOSIS — Z6835 Body mass index (BMI) 35.0-35.9, adult: Secondary | ICD-10-CM | POA: Diagnosis not present

## 2016-11-03 DIAGNOSIS — E0590 Thyrotoxicosis, unspecified without thyrotoxic crisis or storm: Secondary | ICD-10-CM | POA: Diagnosis not present

## 2016-11-03 DIAGNOSIS — F3130 Bipolar disorder, current episode depressed, mild or moderate severity, unspecified: Secondary | ICD-10-CM | POA: Diagnosis not present

## 2016-11-03 DIAGNOSIS — E1122 Type 2 diabetes mellitus with diabetic chronic kidney disease: Secondary | ICD-10-CM | POA: Diagnosis not present

## 2016-11-03 DIAGNOSIS — N183 Chronic kidney disease, stage 3 (moderate): Secondary | ICD-10-CM | POA: Diagnosis not present

## 2016-11-03 DIAGNOSIS — F419 Anxiety disorder, unspecified: Secondary | ICD-10-CM | POA: Diagnosis not present

## 2016-12-09 DIAGNOSIS — I10 Essential (primary) hypertension: Secondary | ICD-10-CM | POA: Diagnosis not present

## 2016-12-09 DIAGNOSIS — E872 Acidosis: Secondary | ICD-10-CM | POA: Diagnosis not present

## 2016-12-09 DIAGNOSIS — N179 Acute kidney failure, unspecified: Secondary | ICD-10-CM | POA: Diagnosis not present

## 2016-12-09 DIAGNOSIS — N183 Chronic kidney disease, stage 3 (moderate): Secondary | ICD-10-CM | POA: Diagnosis not present

## 2016-12-10 DIAGNOSIS — E1165 Type 2 diabetes mellitus with hyperglycemia: Secondary | ICD-10-CM | POA: Diagnosis not present

## 2016-12-10 DIAGNOSIS — Z299 Encounter for prophylactic measures, unspecified: Secondary | ICD-10-CM | POA: Diagnosis not present

## 2016-12-10 DIAGNOSIS — I10 Essential (primary) hypertension: Secondary | ICD-10-CM | POA: Diagnosis not present

## 2016-12-10 DIAGNOSIS — F3130 Bipolar disorder, current episode depressed, mild or moderate severity, unspecified: Secondary | ICD-10-CM | POA: Diagnosis not present

## 2016-12-10 DIAGNOSIS — N183 Chronic kidney disease, stage 3 (moderate): Secondary | ICD-10-CM | POA: Diagnosis not present

## 2016-12-10 DIAGNOSIS — Z6835 Body mass index (BMI) 35.0-35.9, adult: Secondary | ICD-10-CM | POA: Diagnosis not present

## 2016-12-10 DIAGNOSIS — E1142 Type 2 diabetes mellitus with diabetic polyneuropathy: Secondary | ICD-10-CM | POA: Diagnosis not present

## 2016-12-10 DIAGNOSIS — E7800 Pure hypercholesterolemia, unspecified: Secondary | ICD-10-CM | POA: Diagnosis not present

## 2016-12-10 DIAGNOSIS — F419 Anxiety disorder, unspecified: Secondary | ICD-10-CM | POA: Diagnosis not present

## 2016-12-16 DIAGNOSIS — I10 Essential (primary) hypertension: Secondary | ICD-10-CM | POA: Diagnosis not present

## 2016-12-16 DIAGNOSIS — N183 Chronic kidney disease, stage 3 (moderate): Secondary | ICD-10-CM | POA: Diagnosis not present

## 2016-12-16 DIAGNOSIS — F3130 Bipolar disorder, current episode depressed, mild or moderate severity, unspecified: Secondary | ICD-10-CM | POA: Diagnosis not present

## 2016-12-16 DIAGNOSIS — Z1389 Encounter for screening for other disorder: Secondary | ICD-10-CM | POA: Diagnosis not present

## 2016-12-16 DIAGNOSIS — Z299 Encounter for prophylactic measures, unspecified: Secondary | ICD-10-CM | POA: Diagnosis not present

## 2016-12-16 DIAGNOSIS — F419 Anxiety disorder, unspecified: Secondary | ICD-10-CM | POA: Diagnosis not present

## 2016-12-16 DIAGNOSIS — E7800 Pure hypercholesterolemia, unspecified: Secondary | ICD-10-CM | POA: Diagnosis not present

## 2016-12-16 DIAGNOSIS — Z6835 Body mass index (BMI) 35.0-35.9, adult: Secondary | ICD-10-CM | POA: Diagnosis not present

## 2016-12-16 DIAGNOSIS — E1142 Type 2 diabetes mellitus with diabetic polyneuropathy: Secondary | ICD-10-CM | POA: Diagnosis not present

## 2016-12-16 DIAGNOSIS — E1165 Type 2 diabetes mellitus with hyperglycemia: Secondary | ICD-10-CM | POA: Diagnosis not present

## 2016-12-17 DIAGNOSIS — N390 Urinary tract infection, site not specified: Secondary | ICD-10-CM | POA: Diagnosis not present

## 2016-12-17 DIAGNOSIS — N183 Chronic kidney disease, stage 3 (moderate): Secondary | ICD-10-CM | POA: Diagnosis not present

## 2016-12-17 DIAGNOSIS — E7800 Pure hypercholesterolemia, unspecified: Secondary | ICD-10-CM | POA: Diagnosis not present

## 2016-12-17 DIAGNOSIS — Z299 Encounter for prophylactic measures, unspecified: Secondary | ICD-10-CM | POA: Diagnosis not present

## 2016-12-17 DIAGNOSIS — I10 Essential (primary) hypertension: Secondary | ICD-10-CM | POA: Diagnosis not present

## 2016-12-17 DIAGNOSIS — E1142 Type 2 diabetes mellitus with diabetic polyneuropathy: Secondary | ICD-10-CM | POA: Diagnosis not present

## 2016-12-17 DIAGNOSIS — F3130 Bipolar disorder, current episode depressed, mild or moderate severity, unspecified: Secondary | ICD-10-CM | POA: Diagnosis not present

## 2016-12-17 DIAGNOSIS — Z713 Dietary counseling and surveillance: Secondary | ICD-10-CM | POA: Diagnosis not present

## 2016-12-17 DIAGNOSIS — Z79899 Other long term (current) drug therapy: Secondary | ICD-10-CM | POA: Diagnosis not present

## 2016-12-17 DIAGNOSIS — Z6835 Body mass index (BMI) 35.0-35.9, adult: Secondary | ICD-10-CM | POA: Diagnosis not present

## 2016-12-17 DIAGNOSIS — E871 Hypo-osmolality and hyponatremia: Secondary | ICD-10-CM | POA: Diagnosis not present

## 2016-12-17 DIAGNOSIS — E1165 Type 2 diabetes mellitus with hyperglycemia: Secondary | ICD-10-CM | POA: Diagnosis not present

## 2016-12-17 DIAGNOSIS — F319 Bipolar disorder, unspecified: Secondary | ICD-10-CM | POA: Diagnosis not present

## 2016-12-17 DIAGNOSIS — E1122 Type 2 diabetes mellitus with diabetic chronic kidney disease: Secondary | ICD-10-CM | POA: Diagnosis not present

## 2016-12-17 DIAGNOSIS — Z87891 Personal history of nicotine dependence: Secondary | ICD-10-CM | POA: Diagnosis not present

## 2016-12-19 ENCOUNTER — Encounter (HOSPITAL_COMMUNITY): Payer: Self-pay | Admitting: Emergency Medicine

## 2016-12-19 ENCOUNTER — Emergency Department (HOSPITAL_COMMUNITY)
Admission: EM | Admit: 2016-12-19 | Discharge: 2016-12-19 | Disposition: A | Discharge status: Home Health | Payer: Medicare Other | Attending: Emergency Medicine | Admitting: Emergency Medicine

## 2016-12-19 DIAGNOSIS — Z79899 Other long term (current) drug therapy: Secondary | ICD-10-CM | POA: Diagnosis not present

## 2016-12-19 DIAGNOSIS — E860 Dehydration: Secondary | ICD-10-CM | POA: Insufficient documentation

## 2016-12-19 DIAGNOSIS — Z87891 Personal history of nicotine dependence: Secondary | ICD-10-CM | POA: Insufficient documentation

## 2016-12-19 DIAGNOSIS — E1122 Type 2 diabetes mellitus with diabetic chronic kidney disease: Secondary | ICD-10-CM | POA: Diagnosis not present

## 2016-12-19 DIAGNOSIS — I129 Hypertensive chronic kidney disease with stage 1 through stage 4 chronic kidney disease, or unspecified chronic kidney disease: Secondary | ICD-10-CM | POA: Diagnosis not present

## 2016-12-19 DIAGNOSIS — Z7982 Long term (current) use of aspirin: Secondary | ICD-10-CM | POA: Diagnosis not present

## 2016-12-19 DIAGNOSIS — E039 Hypothyroidism, unspecified: Secondary | ICD-10-CM | POA: Insufficient documentation

## 2016-12-19 DIAGNOSIS — R339 Retention of urine, unspecified: Secondary | ICD-10-CM | POA: Diagnosis present

## 2016-12-19 DIAGNOSIS — N183 Chronic kidney disease, stage 3 (moderate): Secondary | ICD-10-CM | POA: Diagnosis not present

## 2016-12-19 LAB — URINALYSIS, ROUTINE W REFLEX MICROSCOPIC
BACTERIA UA: NONE SEEN
Bilirubin Urine: NEGATIVE
Hgb urine dipstick: NEGATIVE
KETONES UR: 5 mg/dL — AB
Leukocytes, UA: NEGATIVE
Nitrite: NEGATIVE
PROTEIN: NEGATIVE mg/dL
Specific Gravity, Urine: 1.009 (ref 1.005–1.030)
pH: 5 (ref 5.0–8.0)

## 2016-12-19 LAB — CBC WITH DIFFERENTIAL/PLATELET
Basophils Absolute: 0 10*3/uL (ref 0.0–0.1)
Basophils Relative: 0 %
Eosinophils Absolute: 0 10*3/uL (ref 0.0–0.7)
Eosinophils Relative: 0 %
HEMATOCRIT: 38.3 % (ref 36.0–46.0)
HEMOGLOBIN: 12.5 g/dL (ref 12.0–15.0)
LYMPHS ABS: 1.6 10*3/uL (ref 0.7–4.0)
Lymphocytes Relative: 17 %
MCH: 26.7 pg (ref 26.0–34.0)
MCHC: 32.6 g/dL (ref 30.0–36.0)
MCV: 81.8 fL (ref 78.0–100.0)
Monocytes Absolute: 0.8 10*3/uL (ref 0.1–1.0)
Monocytes Relative: 9 %
NEUTROS ABS: 6.9 10*3/uL (ref 1.7–7.7)
NEUTROS PCT: 74 %
Platelets: 242 10*3/uL (ref 150–400)
RBC: 4.68 MIL/uL (ref 3.87–5.11)
RDW: 15.8 % — AB (ref 11.5–15.5)
WBC: 9.4 10*3/uL (ref 4.0–10.5)

## 2016-12-19 LAB — BASIC METABOLIC PANEL
Anion gap: 12 (ref 5–15)
BUN: 30 mg/dL — AB (ref 6–20)
CO2: 20 mmol/L — AB (ref 22–32)
CREATININE: 1.48 mg/dL — AB (ref 0.44–1.00)
Calcium: 10.1 mg/dL (ref 8.9–10.3)
Chloride: 98 mmol/L — ABNORMAL LOW (ref 101–111)
GFR calc Af Amer: 42 mL/min — ABNORMAL LOW (ref >60)
GFR calc non Af Amer: 36 mL/min — ABNORMAL LOW (ref >60)
Glucose, Bld: 238 mg/dL — ABNORMAL HIGH (ref 65–99)
Potassium: 4.1 mmol/L (ref 3.5–5.1)
Sodium: 130 mmol/L — ABNORMAL LOW (ref 135–145)

## 2016-12-19 NOTE — Discharge Instructions (Signed)
Drink plenty of  fluids and follow up with your md  as needed

## 2016-12-19 NOTE — ED Triage Notes (Signed)
Pt c/o urinary retention. States she has not urinated since 0600 this am. Pt currently taking Cipro for UTI.

## 2016-12-19 NOTE — ED Provider Notes (Signed)
[facility] DEPT Provider Note   CSN: [id number] Arrival date & time: 12/19/16  1408     History   Chief Complaint Chief Complaint  Patient presents with  . Urinary Retention    HPI Erika Jacobs is a 66 y.o. female.  Patient states she has not urinated much today. She has a history of renal insufficiency    Illness  This is a recurrent problem. The current episode started 12 to 24 hours ago. The problem occurs constantly. The problem has not changed since onset.Pertinent negatives include no chest pain, no abdominal pain and no headaches. Nothing aggravates the symptoms. Nothing relieves the symptoms. She has tried nothing for the symptoms. The treatment provided mild relief.    Past Medical History:  Diagnosis Date  . Anemia   . Anxiety   . Arthritis   . Bipolar 1 disorder ([place])   . Chronic constipation   . Chronic headache   . CKD (chronic kidney disease)   . Diabetes mellitus without complication ([place])   . Diabetic polyneuropathy ([place])   . Gastritis and duodenitis   . GERD (gastroesophageal reflux disease)   . Hypercholesteremia   . Hypertension   . Hypothyroid   . Overactive bladder   . Panic attack   . Paranoia ([place])   . Psychosis   . Shortness of breath   . Thought disorder     Patient Active Problem List   Diagnosis Date Noted  . UTI (urinary tract infection) 10/17/2016  . CKD (chronic kidney disease), stage III 10/17/2016  . Acute renal failure (ARF) ([place]) 10/17/2016  . Hypotension 10/17/2016  . Tachycardia 10/17/2016  . Leukocytosis 10/17/2016  . Bipolar I disorder, severe, current or most recent episode depressed, with psychotic features ([place]) 10/20/2014  . Dyslipidemia 10/18/2014  . GERD (gastroesophageal reflux disease) 10/18/2014  . Hypothyroidism 10/18/2014  . Diabetes ([place]) 10/17/2014  . Hypertension 10/17/2014  . Urinary incontinence 10/17/2014  . Suicidal ideation   . Cholecystitis with cholelithiasis 06/02/2013    Past  Surgical History:  Procedure Laterality Date  . CHOLECYSTECTOMY N/A 06/02/2013   Procedure: LAPAROSCOPIC CHOLECYSTECTOMY;  Surgeon: Scherry Ran, MD;  Location: [facility] ORS;  Service: General;  Laterality: N/A;  . COLONOSCOPY WITH ESOPHAGOGASTRODUODENOSCOPY (EGD) N/A 05/20/2013   Procedure: COLONOSCOPY WITH ESOPHAGOGASTRODUODENOSCOPY (EGD);  Surgeon: Rogene Houston, MD;  Location: [facility] ENDO SUITE;  Service: Endoscopy;  Laterality: N/A;  730  . DIALYSIS/PERMA CATHETER INSERTION N/A 10/17/2016   Procedure: Dialysis/Perma Catheter Insertion;  Surgeon: Katha Cabal, MD;  Location: [facility] CV LAB;  Service: Cardiovascular;  Laterality: N/A;  . EYE SURGERY Left   . TUBAL LIGATION      OB History    Gravida Para Term Preterm AB Living   4         3   SAB TAB Ectopic Multiple Live Births                   Home Medications    Prior to Admission medications   Medication Sig Start Date End Date Taking? Authorizing Provider  amLODipine (NORVASC) 5 MG tablet Take 1 tablet (5 mg total) by mouth daily. 10/21/16  Yes Henreitta Leber, MD  aspirin EC 81 MG tablet Take 81 mg by mouth daily.   Yes [provider]  benztropine (COGENTIN) 1 MG tablet Take 1 tablet by mouth daily. 12/17/16  Yes [provider]  ciprofloxacin (CIPRO) 500 MG tablet Take 1 tablet by mouth 2 (two) times  daily. 12/17/16  Yes [provider]  losartan (COZAAR) 25 MG tablet Take 1 tablet by mouth daily. 12/17/16  Yes [provider]  metoprolol succinate (TOPROL-XL) 50 MG 24 hr tablet Take 50 mg by mouth daily.  12/31/15  Yes [provider]  Multiple Vitamin (MULTIVITAMIN) tablet Take 1 tablet by mouth daily.   Yes [provider]  paliperidone (INVEGA) 3 MG 24 hr tablet Take 3 mg by mouth daily.  01/09/16  Yes [provider]  pravastatin (PRAVACHOL) 40 MG tablet Take 40 mg by mouth daily.   Yes [provider]  TRESIBA FLEXTOUCH 200 UNIT/ML SOPN Inject  16 Units into the skin daily.  05/25/15  Yes [provider]  venlafaxine XR (EFFEXOR-XR) 75 MG 24 hr capsule Take 1 capsule (75 mg total) by mouth daily with breakfast. 10/22/16  Yes Henreitta Leber, MD    Family History No family history on file.  Social History Social History  Substance Use Topics  . Smoking status: Former Smoker    Packs/day: 2.00    Years: 30.00    Types: Cigarettes    Start date: 10/18/1967    Quit date: 05/20/2002  . Smokeless tobacco: Never Used  . Alcohol use No     Allergies   Aripiprazole; Invega [paliperidone]; Lisinopril; and Risperidone and related   Review of Systems Review of Systems  Constitutional: Negative for appetite change and fatigue.  HENT: Negative for congestion, ear discharge and sinus pressure.   Eyes: Negative for discharge.  Respiratory: Negative for cough.   Cardiovascular: Negative for chest pain.  Gastrointestinal: Negative for abdominal pain and diarrhea.  Genitourinary: Negative for frequency and hematuria.       Decreased urine output  Musculoskeletal: Negative for back pain.  Skin: Negative for rash.  Neurological: Negative for seizures and headaches.  Psychiatric/Behavioral: Negative for hallucinations.     Physical Exam Updated Vital Signs BP 133/81   Pulse 98   Temp 98.4 F (36.9 C)   Resp 18   Ht 5' (1.524 m)   Wt 88.5 kg (195 lb)   LMP  (LMP Unknown)   SpO2 97%   BMI 38.08 kg/m   Physical Exam  Constitutional: She is oriented to person, place, and time. She appears well-developed.  HENT:  Head: Normocephalic.  Eyes: Conjunctivae and EOM are normal. No scleral icterus.  Neck: Neck supple. No thyromegaly present.  Cardiovascular: Normal rate and regular rhythm.  Exam reveals no gallop and no friction rub.   No murmur heard. Pulmonary/Chest: No stridor. She has no wheezes. She has no rales. She exhibits no tenderness.  Abdominal: She exhibits no distension. There is no tenderness. There is no  rebound.  Musculoskeletal: Normal range of motion. She exhibits no edema.  Lymphadenopathy:    She has no cervical adenopathy.  Neurological: She is oriented to person, place, and time. She exhibits normal muscle tone. Coordination normal.  Skin: No rash noted. No erythema.  Psychiatric: She has a normal mood and affect. Her behavior is normal.     ED Treatments / Results  Labs (all labs ordered are listed, but only abnormal results are displayed) Labs Reviewed  URINALYSIS, ROUTINE W REFLEX MICROSCOPIC - Abnormal; Notable for the following:       Result Value   APPearance HAZY (*)    Glucose, UA >=500 (*)    Ketones, ur 5 (*)    Squamous Epithelial / LPF 0-5 (*)    All other components within normal  limits  CBC WITH DIFFERENTIAL/PLATELET - Abnormal; Notable for the following:    RDW 15.8 (*)    All other components within normal limits  BASIC METABOLIC PANEL - Abnormal; Notable for the following:    Sodium 130 (*)    Chloride 98 (*)    CO2 20 (*)    Glucose, Bld 238 (*)    BUN 30 (*)    Creatinine, Ser 1.48 (*)    GFR calc non Af Amer 36 (*)    GFR calc Af Amer 42 (*)    All other components within normal limits    EKG  EKG Interpretation None       Radiology No results found.  Procedures Procedures (including critical care time)  Medications Ordered in ED Medications - No data to display   Initial Impression / Assessment and Plan / ED Course  I have reviewed the triage vital signs and the nursing notes.  Pertinent labs & imaging results that were available during my care of the patient were reviewed by me and considered in my medical decision making (see chart for details).     Patient had 250 mL by bladder scan. Labs unremarkable. Creatinine has actually improved. Patient drank fluids and was able to urinate fine. Suspect mild dehydration and she will follow-up with her doctor as needed Final Clinical Impressions(s) / ED Diagnoses   Final diagnoses:    Dehydration    New Prescriptions New Prescriptions   No medications on file     Milton Ferguson, MD 12/19/16 1701

## 2016-12-19 NOTE — ED Triage Notes (Signed)
Recently seen in [facility] ED for UTI, given Cipro for infection Has not taken many meds, but presents with complaint of UTI sx   Dr Brigitte Pulse is PCP

## 2016-12-24 DIAGNOSIS — K5904 Chronic idiopathic constipation: Secondary | ICD-10-CM | POA: Diagnosis not present

## 2016-12-24 DIAGNOSIS — E7800 Pure hypercholesterolemia, unspecified: Secondary | ICD-10-CM | POA: Diagnosis not present

## 2016-12-24 DIAGNOSIS — F419 Anxiety disorder, unspecified: Secondary | ICD-10-CM | POA: Diagnosis not present

## 2016-12-24 DIAGNOSIS — I10 Essential (primary) hypertension: Secondary | ICD-10-CM | POA: Diagnosis not present

## 2016-12-24 DIAGNOSIS — Z833 Family history of diabetes mellitus: Secondary | ICD-10-CM | POA: Diagnosis not present

## 2016-12-24 DIAGNOSIS — Z79899 Other long term (current) drug therapy: Secondary | ICD-10-CM | POA: Diagnosis not present

## 2016-12-24 DIAGNOSIS — K219 Gastro-esophageal reflux disease without esophagitis: Secondary | ICD-10-CM | POA: Diagnosis not present

## 2016-12-24 DIAGNOSIS — R109 Unspecified abdominal pain: Secondary | ICD-10-CM | POA: Diagnosis not present

## 2016-12-24 DIAGNOSIS — Z8249 Family history of ischemic heart disease and other diseases of the circulatory system: Secondary | ICD-10-CM | POA: Diagnosis not present

## 2016-12-24 DIAGNOSIS — E119 Type 2 diabetes mellitus without complications: Secondary | ICD-10-CM | POA: Diagnosis not present

## 2016-12-24 DIAGNOSIS — K5900 Constipation, unspecified: Secondary | ICD-10-CM | POA: Diagnosis not present

## 2016-12-24 DIAGNOSIS — Z87891 Personal history of nicotine dependence: Secondary | ICD-10-CM | POA: Diagnosis not present

## 2016-12-25 ENCOUNTER — Encounter (HOSPITAL_COMMUNITY): Payer: Self-pay | Admitting: Emergency Medicine

## 2016-12-25 ENCOUNTER — Emergency Department (HOSPITAL_COMMUNITY)
Admission: EM | Admit: 2016-12-25 | Discharge: 2016-12-26 | Disposition: A | Discharge status: Home / Self Care | Payer: Medicare Other | Attending: Emergency Medicine | Admitting: Emergency Medicine

## 2016-12-25 DIAGNOSIS — Z87891 Personal history of nicotine dependence: Secondary | ICD-10-CM | POA: Insufficient documentation

## 2016-12-25 DIAGNOSIS — I129 Hypertensive chronic kidney disease with stage 1 through stage 4 chronic kidney disease, or unspecified chronic kidney disease: Secondary | ICD-10-CM | POA: Diagnosis not present

## 2016-12-25 DIAGNOSIS — F22 Delusional disorders: Secondary | ICD-10-CM | POA: Insufficient documentation

## 2016-12-25 DIAGNOSIS — N183 Chronic kidney disease, stage 3 (moderate): Secondary | ICD-10-CM | POA: Insufficient documentation

## 2016-12-25 DIAGNOSIS — Z79899 Other long term (current) drug therapy: Secondary | ICD-10-CM | POA: Diagnosis not present

## 2016-12-25 DIAGNOSIS — Z7982 Long term (current) use of aspirin: Secondary | ICD-10-CM | POA: Diagnosis not present

## 2016-12-25 DIAGNOSIS — E1122 Type 2 diabetes mellitus with diabetic chronic kidney disease: Secondary | ICD-10-CM | POA: Insufficient documentation

## 2016-12-25 DIAGNOSIS — F251 Schizoaffective disorder, depressive type: Secondary | ICD-10-CM | POA: Diagnosis not present

## 2016-12-25 DIAGNOSIS — F419 Anxiety disorder, unspecified: Secondary | ICD-10-CM | POA: Diagnosis present

## 2016-12-25 DIAGNOSIS — E039 Hypothyroidism, unspecified: Secondary | ICD-10-CM | POA: Insufficient documentation

## 2016-12-25 LAB — URINALYSIS, ROUTINE W REFLEX MICROSCOPIC
Bilirubin Urine: NEGATIVE
Glucose, UA: 50 mg/dL — AB
Hgb urine dipstick: NEGATIVE
Ketones, ur: 5 mg/dL — AB
LEUKOCYTES UA: NEGATIVE
Nitrite: NEGATIVE
Protein, ur: NEGATIVE mg/dL
Specific Gravity, Urine: 1.004 — ABNORMAL LOW (ref 1.005–1.030)
pH: 6 (ref 5.0–8.0)

## 2016-12-25 LAB — CBC
HEMATOCRIT: 35.1 % — AB (ref 36.0–46.0)
HEMOGLOBIN: 11.3 g/dL — AB (ref 12.0–15.0)
MCH: 26.2 pg (ref 26.0–34.0)
MCHC: 32.2 g/dL (ref 30.0–36.0)
MCV: 81.3 fL (ref 78.0–100.0)
Platelets: 215 10*3/uL (ref 150–400)
RBC: 4.32 MIL/uL (ref 3.87–5.11)
RDW: 15.6 % — ABNORMAL HIGH (ref 11.5–15.5)
WBC: 10.2 10*3/uL (ref 4.0–10.5)

## 2016-12-25 LAB — RAPID URINE DRUG SCREEN, HOSP PERFORMED
Amphetamines: NOT DETECTED
BARBITURATES: NOT DETECTED
Benzodiazepines: NOT DETECTED
COCAINE: NOT DETECTED
Opiates: NOT DETECTED
TETRAHYDROCANNABINOL: NOT DETECTED

## 2016-12-25 LAB — COMPREHENSIVE METABOLIC PANEL
ALBUMIN: 3.5 g/dL (ref 3.5–5.0)
ALK PHOS: 56 U/L (ref 38–126)
ALT: 22 U/L (ref 14–54)
ANION GAP: 11 (ref 5–15)
AST: 20 U/L (ref 15–41)
BILIRUBIN TOTAL: 0.5 mg/dL (ref 0.3–1.2)
BUN: 14 mg/dL (ref 6–20)
CALCIUM: 8.9 mg/dL (ref 8.9–10.3)
CO2: 22 mmol/L (ref 22–32)
Chloride: 100 mmol/L — ABNORMAL LOW (ref 101–111)
Creatinine, Ser: 1.05 mg/dL — ABNORMAL HIGH (ref 0.44–1.00)
GFR calc Af Amer: >60 mL/min (ref >60)
GFR calc non Af Amer: 55 mL/min — ABNORMAL LOW (ref >60)
GLUCOSE: 168 mg/dL — AB (ref 65–99)
Potassium: 4 mmol/L (ref 3.5–5.1)
SODIUM: 133 mmol/L — AB (ref 135–145)
TOTAL PROTEIN: 6.9 g/dL (ref 6.5–8.1)

## 2016-12-25 LAB — ETHANOL: Alcohol, Ethyl (B): <5 mg/dL (ref <5)

## 2016-12-25 MED ORDER — VENLAFAXINE HCL ER 75 MG PO CP24
75.0000 mg | ORAL_CAPSULE | Freq: Every day | ORAL | Status: DC
  Administered 2016-12-26: 75 mg via ORAL
  Filled 2016-12-25: qty 1

## 2016-12-25 MED ORDER — INSULIN DEGLUDEC 200 UNIT/ML ~~LOC~~ SOPN: 20.0000 [IU] | PEN_INJECTOR | Freq: Every day | SUBCUTANEOUS | Status: DC

## 2016-12-25 MED ORDER — PALIPERIDONE ER 3 MG PO TB24
3.0000 mg | ORAL_TABLET | Freq: Every day | ORAL | Status: DC
  Administered 2016-12-26: 3 mg via ORAL
  Filled 2016-12-25: qty 1

## 2016-12-25 MED ORDER — INSULIN GLARGINE 100 UNIT/ML ~~LOC~~ SOLN
20.0000 [IU] | Freq: Every day | SUBCUTANEOUS | Status: DC
  Administered 2016-12-25: 20 [IU] via SUBCUTANEOUS
  Filled 2016-12-25 (×2): qty 0.2

## 2016-12-25 MED ORDER — ADULT MULTIVITAMIN W/MINERALS CH
1.0000 | ORAL_TABLET | Freq: Every day | ORAL | Status: DC
  Administered 2016-12-26: 1 via ORAL
  Filled 2016-12-25 (×2): qty 1

## 2016-12-25 MED ORDER — METOPROLOL SUCCINATE ER 50 MG PO TB24
50.0000 mg | ORAL_TABLET | Freq: Every day | ORAL | Status: DC
  Administered 2016-12-26: 50 mg via ORAL
  Filled 2016-12-25: qty 1

## 2016-12-25 MED ORDER — LOSARTAN POTASSIUM 25 MG PO TABS
25.0000 mg | ORAL_TABLET | Freq: Every day | ORAL | Status: DC
  Administered 2016-12-26: 25 mg via ORAL
  Filled 2016-12-25: qty 1

## 2016-12-25 MED ORDER — BENZTROPINE MESYLATE 1 MG PO TABS
1.0000 mg | ORAL_TABLET | Freq: Every day | ORAL | Status: DC
  Administered 2016-12-26: 1 mg via ORAL
  Filled 2016-12-25 (×2): qty 1

## 2016-12-25 MED ORDER — AMLODIPINE BESYLATE 5 MG PO TABS
5.0000 mg | ORAL_TABLET | Freq: Every day | ORAL | Status: DC
  Administered 2016-12-26: 5 mg via ORAL
  Filled 2016-12-25 (×2): qty 1

## 2016-12-25 MED ORDER — ASPIRIN EC 81 MG PO TBEC
81.0000 mg | DELAYED_RELEASE_TABLET | Freq: Every day | ORAL | Status: DC
  Administered 2016-12-26: 81 mg via ORAL
  Filled 2016-12-25 (×2): qty 1

## 2016-12-25 MED ORDER — PRAVASTATIN SODIUM 40 MG PO TABS
40.0000 mg | ORAL_TABLET | Freq: Every day | ORAL | Status: DC
  Administered 2016-12-26: 40 mg via ORAL
  Filled 2016-12-25: qty 1

## 2016-12-25 NOTE — BH Assessment (Addendum)
Assessment Note  Erika Jacobs is an 66 y.o. female. She has a history of psychosis, paranoia, anxiety, and Bipolar I Disorder. She self reports a history of Schizoaffective Disorder. She is voluntary and was brought in by her son. She has paranoid delusions of beating her dog. States, "I thought I beat my dog.Marland KitchenMarland KitchenMarland KitchenSo I called the police on myself.Marland KitchenMarland KitchenI wanted them to come and arrest me". The son denies that patient actually beat her dog. Her son was present during the TTS assessment and explains that patient tends to be paranoid in the mornings and night only. He is concerned that her medications are not strong enough. He went to check on patient today at her home and found her sitting outside. He asked her why she ws sitting outside. Patient told him, "I'm waiting on the police". She has also called the police to her home on several occasions over the past several days. Patient denies suicidal ideations. No history of prior suicide attempts or gestures. No homicidal thoughts or history of aggressive behaviors. No AVH's. She does not appear to be responding to internal stimuli. Patient's presentation most likely resembles a delusional context. She reports feeling fatigue as a her only depressive symptom. No issues with appetite. She typically sleeps well but did not sleep well last night. She seeks outpatient services at [facility] in [place]. She has been admitted to [facility] and [facility] in the past.   Diagnosis: Bipolar Disorder with psychotic features; Anxiety Disorder    Past Medical History:  Past Medical History:  Diagnosis Date  . Anemia   . Anxiety   . Arthritis   . Bipolar 1 disorder ([place])   . Chronic constipation   . Chronic headache   . CKD (chronic kidney disease)   . Diabetes mellitus without complication ([place])   . Diabetic polyneuropathy ([place])   . Gastritis and duodenitis   . GERD (gastroesophageal reflux disease)   . Hypercholesteremia   .  Hypertension   . Hypothyroid   . Overactive bladder   . Panic attack   . Paranoia ([place])   . Psychosis   . Shortness of breath   . Thought disorder     Past Surgical History:  Procedure Laterality Date  . CHOLECYSTECTOMY N/A 06/02/2013   Procedure: LAPAROSCOPIC CHOLECYSTECTOMY;  Surgeon: Scherry Ran, MD;  Location: [facility] ORS;  Service: General;  Laterality: N/A;  . COLONOSCOPY WITH ESOPHAGOGASTRODUODENOSCOPY (EGD) N/A 05/20/2013   Procedure: COLONOSCOPY WITH ESOPHAGOGASTRODUODENOSCOPY (EGD);  Surgeon: Rogene Houston, MD;  Location[facility];  Service: Endoscopy;  Laterality: N/A;  730  . DIALYSIS/PERMA CATHETER INSERTION N/A 10/17/2016   Procedure: Dialysis/Perma Catheter Insertion;  Surgeon: Katha Cabal, MD;  Location: [facility] LAB;  Service: Cardiovascular;  Laterality: N/A;  . EYE SURGERY Left   . TUBAL LIGATION      Family History: History reviewed. No pertinent family history.  Social History:  reports that she quit smoking about 14 years ago. Her smoking use included Cigarettes. She started smoking about 49 years ago. She has a 60.00 pack-year smoking history. She has never used smokeless tobacco. She reports that she does not drink alcohol or use drugs.  Additional Social History:  Alcohol / Drug Use Pain Medications: See PTA Prescriptions: See PTA Over the Counter: See PTA History of alcohol / drug use?: No history of alcohol / drug abuse  CIWA: CIWA-Ar BP: (!) 141/82 Pulse Rate: 79 COWS:    Allergies:  Allergies  Allergen  Reactions  . Aripiprazole Other (See Comments)    Pt shakes when taking this medication.  Lorayne Bender [Paliperidone]     High doses causes patient to shake  . Lisinopril Other (See Comments)    Kidney   . Risperidone And Related     hallucinations    Home Medications:  (Not in a hospital admission)  OB/GYN Status:  No LMP recorded (lmp unknown). Patient is postmenopausal.  General Assessment Data Location of  Assessment: [facility] TTS Assessment: In system Is this a Tele or Face-to-Face Assessment?: Face-to-Face Is this an Initial Assessment or a Re-assessment for this encounter?: Initial Assessment Marital status: Divorced Gray name:  Gellis ) Is patient pregnant?: No Pregnancy Status: No Living Arrangements: Alone Can pt return to current living arrangement?: Yes Admission Status: Voluntary Is patient capable of signing voluntary admission?: Yes Referral Source: Self/Family/Friend Insurance type:  Passenger transport manager )     Crisis Care Plan Living Arrangements: Alone Legal Guardian: Other: (no legal guardian ) Name of Psychiatrist:  ([facility] in [place] ) Name of Therapist:  ([facility] in [place] )  Education Status Is patient currently in school?: No Current Grade:  (n/a) Highest grade of school patient has completed:  (some college ) Name of school:  (n/a) Contact person:  (n/a)  Risk to self with the past 6 months Suicidal Ideation: No Has patient been a risk to self within the past 6 months prior to admission? : No Suicidal Intent: No Has patient had any suicidal intent within the past 6 months prior to admission? : No Is patient at risk for suicide?: No Suicidal Plan?: No Has patient had any suicidal plan within the past 6 months prior to admission? : No Access to Means: No Previous Attempts/Gestures: No How many times?:  (0) Other Self Harm Risks:  (denies self mutilating behaviors ) Triggers for Past Attempts: Other (Comment) (denies prior suicide attempts or gestures) Intentional Self Injurious Behavior: None Family Suicide History: Yes ("I have 2-3 family members w/ mental health issues") Recent stressful life event(s): Other (Comment) ("I thought I made my dog sick", "Kidney disease") Persecutory voices/beliefs?: No Depression: Yes Depression Symptoms: Fatigue Substance abuse history and/or treatment for substance abuse?: No Suicide prevention information given to  non-admitted patients: Not applicable  Risk to Others within the past 6 months Homicidal Ideation: No Does patient have any lifetime risk of violence toward others beyond the six months prior to admission? : No Thoughts of Harm to Others: No Current Homicidal Intent: No Current Homicidal Plan: No Access to Homicidal Means: No Identified Victim:  (n/a) History of harm to others?: No Assessment of Violence: None Noted Violent Behavior Description:  (currently calm and coopertive ) Does patient have access to weapons?: No Criminal Charges Pending?: No Does patient have a court date: No Is patient on probation?: No  Psychosis Hallucinations: None noted ("I feel paranoid but I have been paranoid my entire life") Delusions: Unspecified ("Feels that she was getting arrested for hurting her dog")  Mental Status Report Appearance/Hygiene: Other (Comment) (casually dressed ) Eye Contact: Good Motor Activity: Freedom of movement Speech: Logical/coherent Level of Consciousness: Alert Mood: Anxious Affect: Appropriate to circumstance Anxiety Level: None Thought Processes: Coherent, Relevant Judgement: Impaired Orientation: Person, Place, Time, Situation Obsessive Compulsive Thoughts/Behaviors: None  Cognitive Functioning Concentration: Decreased Memory: Recent Intact, Remote Intact IQ: Average Insight: Fair Impulse Control: Fair Appetite: Fair Weight Loss:  (none reported) Weight Gain:  (none reported) Sleep: Decreased Total Hours of Sleep:  ("I usually sleep  goo...last night I didnt' sleep well") Vegetative Symptoms: None  ADLScreening [facility] Assessment Services) Patient's cognitive ability adequate to safely complete daily activities?: Yes Patient able to express need for assistance with ADLs?: Yes Independently performs ADLs?: Yes (appropriate for developmental age)  Prior Inpatient Therapy Prior Inpatient Therapy: Yes Prior Therapy Dates:  (3 prior admissions; last admit  was 3 months ago ) Prior Therapy Facilty/Provider(s):  ([facility]) Reason for Treatment:  (escalated paranoia )  Prior Outpatient Therapy Prior Outpatient Therapy: Yes Prior Therapy Dates:  (current) Prior Therapy Facilty/Provider(s):  ([facility] in [place] ) Reason for Treatment:  (medication managment ) Does patient have an ACCT team?: No Does patient have Intensive In-House Services?  : No Does patient have [facility] services? : No Does patient have P4CC services?: No  ADL Screening (condition at time of admission) Patient's cognitive ability adequate to safely complete daily activities?: Yes Is the patient deaf or have difficulty hearing?: No Does the patient have difficulty seeing, even when wearing glasses/contacts?: No Does the patient have difficulty concentrating, remembering, or making decisions?: No Patient able to express need for assistance with ADLs?: Yes Does the patient have difficulty dressing or bathing?: Yes Independently performs ADLs?: Yes (appropriate for developmental age) Does the patient have difficulty walking or climbing stairs?: No Weakness of Legs: None Weakness of Arms/Hands: None  Home Assistive Devices/Equipment Home Assistive Devices/Equipment: None    Abuse/Neglect Assessment (Assessment to be complete while patient is alone) Physical Abuse: Denies Verbal Abuse: Denies Sexual Abuse: Denies Exploitation of patient/patient's resources: Denies Self-Neglect: Denies Values / Beliefs Cultural Requests During Hospitalization: None Spiritual Requests During Hospitalization: None   Advance Directives (For Healthcare) Does Patient Have a Medical Advance Directive?: No Would patient like information on creating a medical advance directive?: No - Patient declined Nutrition Screen- [facility] Patient's home diet: Regular  Additional Information 1:1 In Past 12 Months?: No CIRT Risk: No Elopement Risk: No Does patient  have medical clearance?: Yes     Disposition: Per Priscille Loveless, NP, recommends Inpatient [facility] treatment. This decisions is also pending medical clearance (recent UTI/hyponatremia) to rule out confusion.  Disposition Initial Assessment Completed for this Encounter: Yes  On Site Evaluation by:   Reviewed with Physician:    Waldon Merl 12/25/2016 6:55 PM
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
## 2016-12-25 NOTE — ED Triage Notes (Signed)
Pt from home with family. Pt reports she called the police last night for beating her dog when she did not hurt her dog. Pt has scizophrenia. Pt had similar symptoms when her medications got off. Has been taking all her medications as prescribed. Pt wants medications adjusted now before her symptoms get worse. Used to be on higher dose of medication before her provider reduced it.

## 2016-12-25 NOTE — ED Notes (Signed)
Bed: [facility] Expected date:  Expected time:  Means of arrival:  Comments: 

## 2016-12-25 NOTE — ED Notes (Signed)
2 bags in locker 27

## 2016-12-25 NOTE — ED Provider Notes (Signed)
[facility] DEPT Provider Note   CSN: [id number] Arrival date & time: 12/25/16  1402     History   Chief Complaint Chief Complaint  Patient presents with  . Anxiety    HPI Erika Jacobs is a 66 y.o. female.  HPI Patient presents to the emergency room for evaluation of worsening anxiety, and paranoia. Patient has a history ofschizophrenia per the family and bipolar disorder per the medical records. The patient has been having increasing anxiety and paranoia over the last several days. Patient's family is here with her. The patient called the police last night on herself because she was concerned that she was harming her dog. Apparently when the police arrived the dog was playful and active and was dragging its tail.  The patient tates she had to take her dog to the vet recently and she thinks that may have affected her paranoia.  Patient does know now that she hasn't harmed her dog. Her son states he has noticed increasing anxiety.  This type of behavioral pattern has happened to her in the past when she tripped getting admitted to a mental health Hospital.  They brought her in to get evaluated. Patient is wondering if her medications needs to be adjusted. She denies any suicidal or homicidal ideation. Past Medical History:  Diagnosis Date  . Anemia   . Anxiety   . Arthritis   . Bipolar 1 disorder ([place])   . Chronic constipation   . Chronic headache   . CKD (chronic kidney disease)   . Diabetes mellitus without complication ([place])   . Diabetic polyneuropathy ([place])   . Gastritis and duodenitis   . GERD (gastroesophageal reflux disease)   . Hypercholesteremia   . Hypertension   . Hypothyroid   . Overactive bladder   . Panic attack   . Paranoia ([place])   . Psychosis   . Shortness of breath   . Thought disorder     Patient Active Problem List   Diagnosis Date Noted  . UTI (urinary tract infection) 10/17/2016  . CKD (chronic kidney disease), stage III 10/17/2016  . Acute  renal failure (ARF) ([place]) 10/17/2016  . Hypotension 10/17/2016  . Tachycardia 10/17/2016  . Leukocytosis 10/17/2016  . Bipolar I disorder, severe, current or most recent episode depressed, with psychotic features ([place]) 10/20/2014  . Dyslipidemia 10/18/2014  . GERD (gastroesophageal reflux disease) 10/18/2014  . Hypothyroidism 10/18/2014  . Diabetes ([place]) 10/17/2014  . Hypertension 10/17/2014  . Urinary incontinence 10/17/2014  . Suicidal ideation   . Cholecystitis with cholelithiasis 06/02/2013    Past Surgical History:  Procedure Laterality Date  . CHOLECYSTECTOMY N/A 06/02/2013   Procedure: LAPAROSCOPIC CHOLECYSTECTOMY;  Surgeon: Scherry Ran, MD;  Location[facility];  Service: General;  Laterality: N/A;  . COLONOSCOPY WITH ESOPHAGOGASTRODUODENOSCOPY (EGD) N/A 05/20/2013   Procedure: COLONOSCOPY WITH ESOPHAGOGASTRODUODENOSCOPY (EGD);  Surgeon: Rogene Houston, MD;  [facility];  Service: Endoscopy;  Laterality: N/A;  730  . DIALYSIS/PERMA CATHETER INSERTION N/A 10/17/2016   Procedure: Dialysis/Perma Catheter Insertion;  Surgeon: Katha Cabal, MD;  Location: [facility] LAB;  Service: Cardiovascular;  Laterality: N/A;  . EYE SURGERY Left   . TUBAL LIGATION      OB History    Gravida Para Term Preterm AB Living   4         3   SAB TAB Ectopic Multiple Live Births  Home Medications    Prior to Admission medications   Medication Sig Start Date End Date Taking? Authorizing Provider  amLODipine (NORVASC) 5 MG tablet Take 1 tablet (5 mg total) by mouth daily. 10/21/16  Yes Henreitta Leber, MD  aspirin EC 81 MG tablet Take 81 mg by mouth daily.   Yes [provider]  benztropine (COGENTIN) 1 MG tablet Take 1 tablet by mouth daily. 12/17/16  Yes [provider]  ciprofloxacin (CIPRO) 500 MG tablet Take 1 tablet by mouth 2 (two) times daily. 12/17/16  Yes [provider]  losartan (COZAAR) 25 MG tablet Take 1 tablet  by mouth daily. 12/17/16  Yes [provider]  metoprolol succinate (TOPROL-XL) 50 MG 24 hr tablet Take 50 mg by mouth daily.  12/31/15  Yes [provider]  Multiple Vitamin (MULTIVITAMIN) tablet Take 1 tablet by mouth daily.   Yes [provider]  paliperidone (INVEGA) 3 MG 24 hr tablet Take 3 mg by mouth daily.  01/09/16  Yes [provider]  pravastatin (PRAVACHOL) 40 MG tablet Take 40 mg by mouth daily.   Yes [provider]  TRESIBA FLEXTOUCH 200 UNIT/ML SOPN Inject 20 Units into the skin daily.  05/25/15  Yes [provider]  venlafaxine XR (EFFEXOR-XR) 75 MG 24 hr capsule Take 1 capsule (75 mg total) by mouth daily with breakfast. 10/22/16  Yes Henreitta Leber, MD    Family History History reviewed. No pertinent family history.  Social History Social History  Substance Use Topics  . Smoking status: Former Smoker    Packs/day: 2.00    Years: 30.00    Types: Cigarettes    Start date: 10/18/1967    Quit date: 05/20/2002  . Smokeless tobacco: Never Used  . Alcohol use No     Allergies   Aripiprazole; Invega [paliperidone]; Lisinopril; and Risperidone and related   Review of Systems Review of Systems  All other systems reviewed and are negative.    Physical Exam Updated Vital Signs BP (!) 141/82   Pulse 79   Temp 98.9 F (37.2 C) (Oral)   Resp 16   LMP  (LMP Unknown)   SpO2 95%   Physical Exam  Constitutional: She appears well-developed and well-nourished. No distress.  HENT:  Head: Normocephalic and atraumatic.  Right Ear: External ear normal.  Left Ear: External ear normal.  Eyes: Conjunctivae are normal. Right eye exhibits no discharge. Left eye exhibits no discharge. No scleral icterus.  Neck: Neck supple. No tracheal deviation present.  Cardiovascular: Normal rate, regular rhythm and intact distal pulses.   Pulmonary/Chest: Effort normal and breath sounds normal. No stridor. No respiratory distress. She has  no wheezes. She has no rales.  Abdominal: Soft. Bowel sounds are normal. She exhibits no distension. There is no tenderness. There is no rebound and no guarding.  Musculoskeletal: She exhibits no edema or tenderness.  Neurological: She is alert. She has normal strength. No cranial nerve deficit (no facial droop, extraocular movements intact, no slurred speech) or sensory deficit. She exhibits normal muscle tone. She displays no seizure activity. Coordination normal.  Skin: Skin is warm and dry. No rash noted.  Psychiatric: She has a normal mood and affect.  Nursing note and vitals reviewed.    ED Treatments / Results  Labs (all labs ordered are listed, but only abnormal results are displayed) Labs Reviewed  CBC - Abnormal; Notable for the following:       Result Value   Hemoglobin 11.3 (*)  HCT 35.1 (*)    RDW 15.6 (*)    All other components within normal limits  COMPREHENSIVE METABOLIC PANEL - Abnormal; Notable for the following:    Sodium 133 (*)    Chloride 100 (*)    Glucose, Bld 168 (*)    Creatinine, Ser 1.05 (*)    GFR calc non Af Amer 55 (*)    All other components within normal limits  URINALYSIS, ROUTINE W REFLEX MICROSCOPIC - Abnormal; Notable for the following:    Color, Urine STRAW (*)    Specific Gravity, Urine 1.004 (*)    Glucose, UA 50 (*)    Ketones, ur 5 (*)    All other components within normal limits  ETHANOL  RAPID URINE DRUG SCREEN, HOSP PERFORMED   Procedures Procedures (including critical care time)  Medications Ordered in ED Medications  amLODipine (NORVASC) tablet 5 mg (5 mg Oral Refused 12/25/16 1903)  aspirin EC tablet 81 mg (81 mg Oral Not Given 12/25/16 2143)  benztropine (COGENTIN) tablet 1 mg (1 mg Oral Refused 12/25/16 1903)  losartan (COZAAR) tablet 25 mg (not administered)  metoprolol succinate (TOPROL-XL) 24 hr tablet 50 mg (not administered)  multivitamin with minerals tablet 1 tablet (not administered)  venlafaxine XR (EFFEXOR-XR) 24  hr capsule 75 mg (not administered)  pravastatin (PRAVACHOL) tablet 40 mg (not administered)  paliperidone (INVEGA) 24 hr tablet 3 mg (not administered)  insulin glargine (LANTUS) injection 20 Units (20 Units Subcutaneous Given 12/25/16 2141)     Initial Impression / Assessment and Plan / ED Course  I have reviewed the triage vital signs and the nursing notes.  Pertinent labs & imaging results that were available during my care of the patient were reviewed by me and considered in my medical decision making (see chart for details).   patient presented to the emergency room for worsening paranoia.  She has a history of schizophrenia and has been having increasing delusions. Is otherwise medically stable. Plan is for inpatient psych treatment.  Final Clinical Impressions(s) / ED Diagnoses   Final diagnoses:  Paranoia [facility])    New Prescriptions New Prescriptions   No medications on file     Dorie Rank, MD 12/25/16 2151

## 2016-12-26 DIAGNOSIS — F251 Schizoaffective disorder, depressive type: Secondary | ICD-10-CM | POA: Diagnosis not present

## 2016-12-26 DIAGNOSIS — Z79899 Other long term (current) drug therapy: Secondary | ICD-10-CM

## 2016-12-26 DIAGNOSIS — Z87891 Personal history of nicotine dependence: Secondary | ICD-10-CM | POA: Diagnosis not present

## 2016-12-26 LAB — CBG MONITORING, ED: Glucose-Capillary: 109 mg/dL — ABNORMAL HIGH (ref 65–99)

## 2016-12-26 NOTE — ED Notes (Signed)
Pt wand by security prior to transfer to [facility]

## 2016-12-26 NOTE — Consult Note (Signed)
[facility] Consult   Reason for Consult:  Paranoia  Referring Physician:  EDP Patient Identification: Erika Jacobs MRN:  [id number] Principal Diagnosis: Schizoaffective disorder, depressive type [facility]) Diagnosis:   Patient Active Problem List   Diagnosis Date Noted  . Schizoaffective disorder, depressive type ([place]) [F25.1] 12/26/2016    Priority: High  . UTI (urinary tract infection) [N39.0] 10/17/2016  . CKD (chronic kidney disease), stage III [N18.3] 10/17/2016  . Acute renal failure (ARF) ([place]) [N17.9] 10/17/2016  . Hypotension [I95.9] 10/17/2016  . Tachycardia [R00.0] 10/17/2016  . Leukocytosis [D72.829] 10/17/2016  . Dyslipidemia [E78.5] 10/18/2014  . GERD (gastroesophageal reflux disease) [K21.9] 10/18/2014  . Hypothyroidism [E03.9] 10/18/2014  . Diabetes ([place]) [E11.9] 10/17/2014  . Hypertension [I10] 10/17/2014  . Urinary incontinence [R32] 10/17/2014  . Cholecystitis with cholelithiasis [K80.10] 06/02/2013    Total Time spent with patient: 45 minutes  Subjective:   Erika Jacobs is a 66 y.o. female patient does not warrant admission.  HPI:  66 yo female presented to the ED with paranoia that she hurt her dog and called the police on herself.  Today on assessment, she reports she has been worried about her dog because he had been sick and became fixated on maybe she did something but realizes this is not true.  She lives alone but has social support close by including her son who has her dog.  No suicidal/homicidal ideations, hallucinations, or withdrawal symptoms.  Reports she wants to go home and feels safe, stable for discharge.  Past Psychiatric History: schizoaffective disorder  Risk to Self: Suicidal Ideation: No Suicidal Intent: No Is patient at risk for suicide?: No Suicidal Plan?: No Access to Means: No How many times?:  (0) Other Self Harm Risks:  (denies self mutilating behaviors ) Triggers for Past Attempts: Other (Comment) (denies  prior suicide attempts or gestures) Intentional Self Injurious Behavior: None Risk to Others: Homicidal Ideation: No Thoughts of Harm to Others: No Current Homicidal Intent: No Current Homicidal Plan: No Access to Homicidal Means: No Identified Victim:  (n/a) History of harm to others?: No Assessment of Violence: None Noted Violent Behavior Description:  (currently calm and coopertive ) Does patient have access to weapons?: No Criminal Charges Pending?: No Does patient have a court date: No Prior Inpatient Therapy: Prior Inpatient Therapy: Yes Prior Therapy Dates:  (3 prior admissions; last admit was 3 months ago ) Prior Therapy Facilty/Provider(s):  ([facility]) Reason for Treatment:  (escalated paranoia ) Prior Outpatient Therapy: Prior Outpatient Therapy: Yes Prior Therapy Dates:  (current) Prior Therapy Facilty/Provider(s):  ([facility] in [place] ) Reason for Treatment:  (medication managment ) Does patient have an ACCT team?: No Does patient have Intensive In-House Services?  : No Does patient have [facility] services? : No Does patient have P4CC services?: No  Past Medical History:  Past Medical History:  Diagnosis Date  . Anemia   . Anxiety   . Arthritis   . Bipolar 1 disorder ([place])   . Chronic constipation   . Chronic headache   . CKD (chronic kidney disease)   . Diabetes mellitus without complication ([place])   . Diabetic polyneuropathy ([place])   . Gastritis and duodenitis   . GERD (gastroesophageal reflux disease)   . Hypercholesteremia   . Hypertension   . Hypothyroid   . Overactive bladder   . Panic attack   . Paranoia ([place])   . Psychosis   . Shortness of breath   . Thought disorder  Past Surgical History:  Procedure Laterality Date  . CHOLECYSTECTOMY N/A 06/02/2013   Procedure: LAPAROSCOPIC CHOLECYSTECTOMY;  Surgeon: Scherry Ran, MD;  Location: [facility] ORS;  Service: General;  Laterality: N/A;  . COLONOSCOPY WITH  ESOPHAGOGASTRODUODENOSCOPY (EGD) N/A 05/20/2013   Procedure: COLONOSCOPY WITH ESOPHAGOGASTRODUODENOSCOPY (EGD);  Surgeon: Rogene Houston, MD;  Location: [facility] ENDO SUITE;  Service: Endoscopy;  Laterality: N/A;  730  . DIALYSIS/PERMA CATHETER INSERTION N/A 10/17/2016   Procedure: Dialysis/Perma Catheter Insertion;  Surgeon: Katha Cabal, MD;  Location: [facility] CV LAB;  Service: Cardiovascular;  Laterality: N/A;  . EYE SURGERY Left   . TUBAL LIGATION     Family History: History reviewed. No pertinent family history. Family Psychiatric  History: unknown Social History:  History  Alcohol Use No     History  Drug Use No    Social History   Social History  . Marital status: Divorced    Spouse name: N/A  . Number of children: N/A  . Years of education: N/A   Social History Main Topics  . Smoking status: Former Smoker    Packs/day: 2.00    Years: 30.00    Types: Cigarettes    Start date: 10/18/1967    Quit date: 05/20/2002  . Smokeless tobacco: Never Used  . Alcohol use No  . Drug use: No  . Sexual activity: Yes    Birth control/ protection: Post-menopausal   Other Topics Concern  . None   Social History Narrative  . None   Additional Social History:    Allergies:   Allergies  Allergen Reactions  . Aripiprazole Other (See Comments)    Pt shakes when taking this medication.  Lorayne Bender [Paliperidone]     High doses causes patient to shake  . Lisinopril Other (See Comments)    Kidney   . Risperidone And Related     hallucinations    Labs:  Results for orders placed or performed during the hospital encounter of 12/25/16 (from the past 48 hour(s))  Rapid urine drug screen (hospital performed)     Status: None   Collection Time: 12/25/16  7:52 PM  Result Value Ref Range   Opiates NONE DETECTED NONE DETECTED   Cocaine NONE DETECTED NONE DETECTED   Benzodiazepines NONE DETECTED NONE DETECTED   Amphetamines NONE DETECTED NONE DETECTED   Tetrahydrocannabinol  NONE DETECTED NONE DETECTED   Barbiturates NONE DETECTED NONE DETECTED    Comment:        DRUG SCREEN FOR MEDICAL PURPOSES ONLY.  IF CONFIRMATION IS NEEDED FOR ANY PURPOSE, NOTIFY LAB WITHIN 5 DAYS.        LOWEST DETECTABLE LIMITS FOR URINE DRUG SCREEN Drug Class       Cutoff (ng/mL) Amphetamine      1000 Barbiturate      200 Benzodiazepine   299 Tricyclics       371 Opiates          300 Cocaine          300 THC              50   Urinalysis, Routine w reflex microscopic     Status: Abnormal   Collection Time: 12/25/16  7:52 PM  Result Value Ref Range   Color, Urine STRAW (A) YELLOW   APPearance CLEAR CLEAR   Specific Gravity, Urine 1.004 (L) 1.005 - 1.030   pH 6.0 5.0 - 8.0   Glucose, UA 50 (A) NEGATIVE mg/dL   Hgb urine dipstick NEGATIVE  NEGATIVE   Bilirubin Urine NEGATIVE NEGATIVE   Ketones, ur 5 (A) NEGATIVE mg/dL   Protein, ur NEGATIVE NEGATIVE mg/dL   Nitrite NEGATIVE NEGATIVE   Leukocytes, UA NEGATIVE NEGATIVE  CBC     Status: Abnormal   Collection Time: 12/25/16  8:25 PM  Result Value Ref Range   WBC 10.2 4.0 - 10.5 K/uL   RBC 4.32 3.87 - 5.11 MIL/uL   Hemoglobin 11.3 (L) 12.0 - 15.0 g/dL   HCT 35.1 (L) 36.0 - 46.0 %   MCV 81.3 78.0 - 100.0 fL   MCH 26.2 26.0 - 34.0 pg   MCHC 32.2 30.0 - 36.0 g/dL   RDW 15.6 (H) 11.5 - 15.5 %   Platelets 215 150 - 400 K/uL  Comprehensive metabolic panel     Status: Abnormal   Collection Time: 12/25/16  8:25 PM  Result Value Ref Range   Sodium 133 (L) 135 - 145 mmol/L   Potassium 4.0 3.5 - 5.1 mmol/L   Chloride 100 (L) 101 - 111 mmol/L   CO2 22 22 - 32 mmol/L   Glucose, Bld 168 (H) 65 - 99 mg/dL   BUN 14 6 - 20 mg/dL   Creatinine, Ser 1.05 (H) 0.44 - 1.00 mg/dL   Calcium 8.9 8.9 - 10.3 mg/dL   Total Protein 6.9 6.5 - 8.1 g/dL   Albumin 3.5 3.5 - 5.0 g/dL   AST 20 15 - 41 U/L   ALT 22 14 - 54 U/L   Alkaline Phosphatase 56 38 - 126 U/L   Total Bilirubin 0.5 0.3 - 1.2 mg/dL   GFR calc non Af Amer 55 (L) >60 mL/min    GFR calc Af Amer >60 >60 mL/min    Comment: (NOTE) The eGFR has been calculated using the CKD EPI equation. This calculation has not been validated in all clinical situations. eGFR's persistently <60 mL/min signify possible Chronic Kidney Disease.    Anion gap 11 5 - 15  Ethanol     Status: None   Collection Time: 12/25/16  8:25 PM  Result Value Ref Range   Alcohol, Ethyl (B) <5 <5 mg/dL    Comment:        LOWEST DETECTABLE LIMIT FOR SERUM ALCOHOL IS 5 mg/dL FOR MEDICAL PURPOSES ONLY   CBG monitoring, ED     Status: Abnormal   Collection Time: 12/26/16  7:10 AM  Result Value Ref Range   Glucose-Capillary 109 (H) 65 - 99 mg/dL    Current Facility-Administered Medications  Medication Dose Route Frequency Provider Last Rate Last Dose  . amLODipine (NORVASC) tablet 5 mg  5 mg Oral Daily Dorie Rank, MD   5 mg at 12/26/16 0941  . aspirin EC tablet 81 mg  81 mg Oral Daily Dorie Rank, MD   81 mg at 12/26/16 0941  . benztropine (COGENTIN) tablet 1 mg  1 mg Oral Daily Dorie Rank, MD   1 mg at 12/26/16 0941  . insulin glargine (LANTUS) injection 20 Units  20 Units Subcutaneous QHS Berton Mount, RPH   20 Units at 12/25/16 2141  . losartan (COZAAR) tablet 25 mg  25 mg Oral Daily Dorie Rank, MD   25 mg at 12/26/16 0941  . metoprolol succinate (TOPROL-XL) 24 hr tablet 50 mg  50 mg Oral Daily Dorie Rank, MD   50 mg at 12/26/16 0941  . multivitamin with minerals tablet 1 tablet  1 tablet Oral Daily Dorie Rank, MD   1 tablet at 12/26/16 0941  .  paliperidone (INVEGA) 24 hr tablet 3 mg  3 mg Oral Daily Dorie Rank, MD   3 mg at 12/26/16 0941  . pravastatin (PRAVACHOL) tablet 40 mg  40 mg Oral Daily Dorie Rank, MD   40 mg at 12/26/16 0941  . venlafaxine XR (EFFEXOR-XR) 24 hr capsule 75 mg  75 mg Oral Q breakfast Dorie Rank, MD   75 mg at 12/26/16 7169   Current Outpatient Prescriptions  Medication Sig Dispense Refill  . amLODipine (NORVASC) 5 MG tablet Take 1 tablet (5 mg total) by mouth daily.  30 tablet 1  . aspirin EC 81 MG tablet Take 81 mg by mouth daily.    . benztropine (COGENTIN) 1 MG tablet Take 1 tablet by mouth daily.    . ciprofloxacin (CIPRO) 500 MG tablet Take 1 tablet by mouth 2 (two) times daily.    Marland Kitchen losartan (COZAAR) 25 MG tablet Take 1 tablet by mouth daily.    . metoprolol succinate (TOPROL-XL) 50 MG 24 hr tablet Take 50 mg by mouth daily.     . Multiple Vitamin (MULTIVITAMIN) tablet Take 1 tablet by mouth daily.    . paliperidone (INVEGA) 3 MG 24 hr tablet Take 3 mg by mouth daily.     . pravastatin (PRAVACHOL) 40 MG tablet Take 40 mg by mouth daily.    . TRESIBA FLEXTOUCH 200 UNIT/ML SOPN Inject 20 Units into the skin daily.     Marland Kitchen venlafaxine XR (EFFEXOR-XR) 75 MG 24 hr capsule Take 1 capsule (75 mg total) by mouth daily with breakfast. 30 capsule 0    Musculoskeletal: Strength & Muscle Tone: within normal limits Gait & Station: normal Patient leans: N/A  Psychiatric Specialty Exam: Physical Exam  Constitutional: She is oriented to person, place, and time. She appears well-developed and well-nourished.  HENT:  Head: Normocephalic.  Respiratory: Effort normal.  Musculoskeletal: Normal range of motion.  Neurological: She is alert and oriented to person, place, and time.  Psychiatric: Her speech is normal and behavior is normal. Judgment and thought content normal. Her mood appears anxious. Cognition and memory are normal.    Review of Systems  Psychiatric/Behavioral: The patient is nervous/anxious.   All other systems reviewed and are negative.   Blood pressure 126/66, pulse 74, temperature 98.4 F (36.9 C), temperature source Oral, resp. rate 16, SpO2 98 %.There is no height or weight on file to calculate BMI.  General Appearance: Casual  Eye Contact:  Good  Speech:  Normal Rate  Volume:  Normal  Mood:  Anxious, mild  Affect:  Congruent  Thought Process:  Coherent and Descriptions of Associations: Intact  Orientation:  Full (Time, Place, and  Person)  Thought Content:  WDL and Logical  Suicidal Thoughts:  No  Homicidal Thoughts:  No  Memory:  Immediate;   Good Recent;   Good Remote;   Good  Judgement:  Fair  Insight:  Good  Psychomotor Activity:  Normal  Concentration:  Concentration: Good and Attention Span: Good  Recall:  Good  Fund of Knowledge:  Fair  Language:  Good  Akathisia:  No  Handed:  Right  AIMS (if indicated):     Assets:  Housing Leisure Time Physical Health Resilience Social Support  ADL's:  Intact  Cognition:  WNL  Sleep:        Treatment Plan Summary: Daily contact with patient to assess and evaluate symptoms and progress in treatment, Medication management and Plan schizoaffective disorder, depressive type:  -Crisis stabilization -Medication management:  Continued  her Cogentin 1 mg daily for EPS, Invega 3 mg daily for psychosis, and Effexor 75 mg daily for depression -Individual counseling  Disposition: No evidence of imminent risk to self or others at present.    Waylan Boga, NP 12/26/2016 9:57 AM  Patient seen face-to-face for psychiatric evaluation, chart reviewed and case discussed with the physician extender and developed treatment plan. Reviewed the information documented and agree with the treatment plan. Corena Pilgrim, MD

## 2016-12-26 NOTE — ED Notes (Signed)
Pt is alert in her room. Pt took her 0800 medication and ate breakfast. She denies si, hi, and hallucinations. She does not appear to be responding to external stimuli at this time. Safety maintained on the unit.

## 2016-12-26 NOTE — Discharge Instructions (Signed)
For your behavioral health needs, you are advised to continue treatment with [facility]:       [facility]      [address] [place] [address]      [phone]

## 2016-12-26 NOTE — BHH Suicide Risk Assessment (Signed)
Suicide Risk Assessment  Discharge Assessment   [facility] Discharge Suicide Risk Assessment   Principal Problem: Schizoaffective disorder, depressive type [facility]) Discharge Diagnoses:  Patient Active Problem List   Diagnosis Date Noted  . Schizoaffective disorder, depressive type ([place]) [F25.1] 12/26/2016    Priority: High  . UTI (urinary tract infection) [N39.0] 10/17/2016  . CKD (chronic kidney disease), stage III [N18.3] 10/17/2016  . Acute renal failure (ARF) ([place]) [N17.9] 10/17/2016  . Hypotension [I95.9] 10/17/2016  . Tachycardia [R00.0] 10/17/2016  . Leukocytosis [D72.829] 10/17/2016  . Dyslipidemia [E78.5] 10/18/2014  . GERD (gastroesophageal reflux disease) [K21.9] 10/18/2014  . Hypothyroidism [E03.9] 10/18/2014  . Diabetes ([place]) [E11.9] 10/17/2014  . Hypertension [I10] 10/17/2014  . Urinary incontinence [R32] 10/17/2014  . Cholecystitis with cholelithiasis [K80.10] 06/02/2013    Total Time spent with patient: 45 minutes  Musculoskeletal: Strength & Muscle Tone: within normal limits Gait & Station: normal Patient leans: N/A  Psychiatric Specialty Exam: Physical Exam  Constitutional: She is oriented to person, place, and time. She appears well-developed and well-nourished.  HENT:  Head: Normocephalic.  Respiratory: Effort normal.  Musculoskeletal: Normal range of motion.  Neurological: She is alert and oriented to person, place, and time.  Psychiatric: Her speech is normal and behavior is normal. Judgment and thought content normal. Her mood appears anxious. Cognition and memory are normal.    Review of Systems  Psychiatric/Behavioral: The patient is nervous/anxious.   All other systems reviewed and are negative.   Blood pressure 126/66, pulse 74, temperature 98.4 F (36.9 C), temperature source Oral, resp. rate 16, SpO2 98 %.There is no height or weight on file to calculate BMI.  General Appearance: Casual  Eye Contact:  Good  Speech:  Normal Rate  Volume:  Normal   Mood:  Anxious, mild  Affect:  Congruent  Thought Process:  Coherent and Descriptions of Associations: Intact  Orientation:  Full (Time, Place, and Person)  Thought Content:  WDL and Logical  Suicidal Thoughts:  No  Homicidal Thoughts:  No  Memory:  Immediate;   Good Recent;   Good Remote;   Good  Judgement:  Fair  Insight:  Good  Psychomotor Activity:  Normal  Concentration:  Concentration: Good and Attention Span: Good  Recall:  Good  Fund of Knowledge:  Fair  Language:  Good  Akathisia:  No  Handed:  Right  AIMS (if indicated):     Assets:  Housing Leisure Time Physical Health Resilience Social Support  ADL's:  Intact  Cognition:  WNL  Sleep:      Mental Status Per Nursing Assessment::   On Admission:   paranoia/anxiety   Demographic Factors:  Caucasian and Living alone  Loss Factors: NA  Historical Factors: NA  Risk Reduction Factors:   Sense of responsibility to family, Living with another person, especially a relative, Positive social support and Positive therapeutic relationship  Continued Clinical Symptoms:  Anxiety, mild  Cognitive Features That Contribute To Risk:  None    Suicide Risk:  Minimal: No identifiable suicidal ideation.  Patients presenting with no risk factors but with morbid ruminations; may be classified as minimal risk based on the severity of the depressive symptoms    Plan Of Care/Follow-up recommendations:  Activity:  as toleratd Diet:  heart healthy diet  Kayline Sheer, NP 12/26/2016, 10:02 AM

## 2016-12-26 NOTE — ED Notes (Signed)
Pt admitted to unit. Pt calm and cooperative. Pt reports she called the police to arrest her for beating her dog. Pt stated she realized she did not beat her dog. Pt reports she has been compliant with prescribed medication but does not think it has been effective. Pt denies recent medication changes. Pt blames son for bringing her to the hospital and controlling her. Pt denies SI/HI/AVH and pain

## 2016-12-26 NOTE — ED Notes (Signed)
Pt d/c with family member. All items returned. D/C instructions given. Pt denies si and hi.

## 2016-12-26 NOTE — BH Assessment (Signed)
[facility] Assessment Progress Note  Per Corena Pilgrim, MD, this pt does not require psychiatric hospitalization at this time.  Pt is to be discharged from [facility] with recommendation to continue treatment with [facility] in [place].  This has been included in pt's discharge instructions.  Pt's nurse, Jan, has been notified.  Jalene Mullet, [place] Triage Specialist [phone]

## 2016-12-26 NOTE — Progress Notes (Signed)
12/26/16 1344:  LRT went to pt room to offer activities, pt declined.   Victorino Sparrow, LRT/CTRS

## 2016-12-31 DIAGNOSIS — F29 Unspecified psychosis not due to a substance or known physiological condition: Secondary | ICD-10-CM | POA: Diagnosis not present

## 2017-01-01 DIAGNOSIS — Z79899 Other long term (current) drug therapy: Secondary | ICD-10-CM | POA: Diagnosis not present

## 2017-01-01 DIAGNOSIS — E871 Hypo-osmolality and hyponatremia: Secondary | ICD-10-CM | POA: Diagnosis not present

## 2017-01-01 DIAGNOSIS — I10 Essential (primary) hypertension: Secondary | ICD-10-CM | POA: Diagnosis not present

## 2017-01-01 DIAGNOSIS — F410 Panic disorder [episodic paroxysmal anxiety] without agoraphobia: Secondary | ICD-10-CM | POA: Diagnosis not present

## 2017-01-01 DIAGNOSIS — R42 Dizziness and giddiness: Secondary | ICD-10-CM | POA: Diagnosis not present

## 2017-01-01 DIAGNOSIS — F319 Bipolar disorder, unspecified: Secondary | ICD-10-CM | POA: Diagnosis not present

## 2017-01-01 DIAGNOSIS — E7800 Pure hypercholesterolemia, unspecified: Secondary | ICD-10-CM | POA: Diagnosis not present

## 2017-01-01 DIAGNOSIS — Z8249 Family history of ischemic heart disease and other diseases of the circulatory system: Secondary | ICD-10-CM | POA: Diagnosis not present

## 2017-01-01 DIAGNOSIS — Z833 Family history of diabetes mellitus: Secondary | ICD-10-CM | POA: Diagnosis not present

## 2017-01-01 DIAGNOSIS — Z87891 Personal history of nicotine dependence: Secondary | ICD-10-CM | POA: Diagnosis not present

## 2017-01-01 DIAGNOSIS — E1165 Type 2 diabetes mellitus with hyperglycemia: Secondary | ICD-10-CM | POA: Diagnosis not present

## 2017-01-05 DIAGNOSIS — E7800 Pure hypercholesterolemia, unspecified: Secondary | ICD-10-CM | POA: Diagnosis not present

## 2017-01-05 DIAGNOSIS — K5900 Constipation, unspecified: Secondary | ICD-10-CM | POA: Diagnosis not present

## 2017-01-05 DIAGNOSIS — Z299 Encounter for prophylactic measures, unspecified: Secondary | ICD-10-CM | POA: Diagnosis not present

## 2017-01-05 DIAGNOSIS — Z6833 Body mass index (BMI) 33.0-33.9, adult: Secondary | ICD-10-CM | POA: Diagnosis not present

## 2017-01-05 DIAGNOSIS — R42 Dizziness and giddiness: Secondary | ICD-10-CM | POA: Diagnosis not present

## 2017-01-05 DIAGNOSIS — F3130 Bipolar disorder, current episode depressed, mild or moderate severity, unspecified: Secondary | ICD-10-CM | POA: Diagnosis not present

## 2017-01-05 DIAGNOSIS — I10 Essential (primary) hypertension: Secondary | ICD-10-CM | POA: Diagnosis not present

## 2017-01-20 DIAGNOSIS — N179 Acute kidney failure, unspecified: Secondary | ICD-10-CM | POA: Diagnosis not present

## 2017-01-20 DIAGNOSIS — R809 Proteinuria, unspecified: Secondary | ICD-10-CM | POA: Diagnosis not present

## 2017-01-20 DIAGNOSIS — I10 Essential (primary) hypertension: Secondary | ICD-10-CM | POA: Diagnosis not present

## 2017-01-20 DIAGNOSIS — E1129 Type 2 diabetes mellitus with other diabetic kidney complication: Secondary | ICD-10-CM | POA: Diagnosis not present

## 2017-01-20 DIAGNOSIS — N182 Chronic kidney disease, stage 2 (mild): Secondary | ICD-10-CM | POA: Diagnosis not present

## 2017-01-29 DIAGNOSIS — I10 Essential (primary) hypertension: Secondary | ICD-10-CM | POA: Diagnosis not present

## 2017-01-29 DIAGNOSIS — E7800 Pure hypercholesterolemia, unspecified: Secondary | ICD-10-CM | POA: Diagnosis not present

## 2017-01-29 DIAGNOSIS — E119 Type 2 diabetes mellitus without complications: Secondary | ICD-10-CM | POA: Diagnosis not present

## 2017-02-09 DIAGNOSIS — F3130 Bipolar disorder, current episode depressed, mild or moderate severity, unspecified: Secondary | ICD-10-CM | POA: Diagnosis not present

## 2017-02-09 DIAGNOSIS — N183 Chronic kidney disease, stage 3 (moderate): Secondary | ICD-10-CM | POA: Diagnosis not present

## 2017-02-09 DIAGNOSIS — Z1331 Encounter for screening for depression: Secondary | ICD-10-CM | POA: Diagnosis not present

## 2017-02-09 DIAGNOSIS — Z0000 Encounter for general adult medical examination without abnormal findings: Secondary | ICD-10-CM | POA: Diagnosis not present

## 2017-02-09 DIAGNOSIS — Z23 Encounter for immunization: Secondary | ICD-10-CM | POA: Diagnosis not present

## 2017-02-09 DIAGNOSIS — Z1211 Encounter for screening for malignant neoplasm of colon: Secondary | ICD-10-CM | POA: Diagnosis not present

## 2017-02-09 DIAGNOSIS — Z1339 Encounter for screening examination for other mental health and behavioral disorders: Secondary | ICD-10-CM | POA: Diagnosis not present

## 2017-02-09 DIAGNOSIS — Z6839 Body mass index (BMI) 39.0-39.9, adult: Secondary | ICD-10-CM | POA: Diagnosis not present

## 2017-02-09 DIAGNOSIS — Z299 Encounter for prophylactic measures, unspecified: Secondary | ICD-10-CM | POA: Diagnosis not present

## 2017-02-09 DIAGNOSIS — F419 Anxiety disorder, unspecified: Secondary | ICD-10-CM | POA: Diagnosis not present

## 2017-02-09 DIAGNOSIS — R5383 Other fatigue: Secondary | ICD-10-CM | POA: Diagnosis not present

## 2017-02-09 DIAGNOSIS — Z7189 Other specified counseling: Secondary | ICD-10-CM | POA: Diagnosis not present

## 2017-02-23 DIAGNOSIS — Z1231 Encounter for screening mammogram for malignant neoplasm of breast: Secondary | ICD-10-CM | POA: Diagnosis not present

## 2017-02-23 DIAGNOSIS — I10 Essential (primary) hypertension: Secondary | ICD-10-CM | POA: Diagnosis not present

## 2017-02-23 DIAGNOSIS — E1129 Type 2 diabetes mellitus with other diabetic kidney complication: Secondary | ICD-10-CM | POA: Diagnosis not present

## 2017-02-23 DIAGNOSIS — Z79899 Other long term (current) drug therapy: Secondary | ICD-10-CM | POA: Diagnosis not present

## 2017-02-23 DIAGNOSIS — N179 Acute kidney failure, unspecified: Secondary | ICD-10-CM | POA: Diagnosis not present

## 2017-03-03 DIAGNOSIS — F419 Anxiety disorder, unspecified: Secondary | ICD-10-CM | POA: Diagnosis not present

## 2017-03-03 DIAGNOSIS — R5383 Other fatigue: Secondary | ICD-10-CM | POA: Diagnosis not present

## 2017-03-03 DIAGNOSIS — Z79899 Other long term (current) drug therapy: Secondary | ICD-10-CM | POA: Diagnosis not present

## 2017-03-03 DIAGNOSIS — E7800 Pure hypercholesterolemia, unspecified: Secondary | ICD-10-CM | POA: Diagnosis not present

## 2017-03-19 DIAGNOSIS — E1122 Type 2 diabetes mellitus with diabetic chronic kidney disease: Secondary | ICD-10-CM | POA: Diagnosis not present

## 2017-03-19 DIAGNOSIS — F3130 Bipolar disorder, current episode depressed, mild or moderate severity, unspecified: Secondary | ICD-10-CM | POA: Diagnosis not present

## 2017-03-19 DIAGNOSIS — E1165 Type 2 diabetes mellitus with hyperglycemia: Secondary | ICD-10-CM | POA: Diagnosis not present

## 2017-03-19 DIAGNOSIS — N183 Chronic kidney disease, stage 3 (moderate): Secondary | ICD-10-CM | POA: Diagnosis not present

## 2017-03-19 DIAGNOSIS — Z299 Encounter for prophylactic measures, unspecified: Secondary | ICD-10-CM | POA: Diagnosis not present

## 2017-03-19 DIAGNOSIS — Z6841 Body Mass Index (BMI) 40.0 and over, adult: Secondary | ICD-10-CM | POA: Diagnosis not present

## 2017-03-19 DIAGNOSIS — E1142 Type 2 diabetes mellitus with diabetic polyneuropathy: Secondary | ICD-10-CM | POA: Diagnosis not present

## 2017-03-27 DIAGNOSIS — I10 Essential (primary) hypertension: Secondary | ICD-10-CM | POA: Diagnosis not present

## 2017-03-27 DIAGNOSIS — E119 Type 2 diabetes mellitus without complications: Secondary | ICD-10-CM | POA: Diagnosis not present

## 2017-03-27 DIAGNOSIS — E7800 Pure hypercholesterolemia, unspecified: Secondary | ICD-10-CM | POA: Diagnosis not present

## 2017-04-02 DIAGNOSIS — E7800 Pure hypercholesterolemia, unspecified: Secondary | ICD-10-CM | POA: Diagnosis not present

## 2017-04-02 DIAGNOSIS — N183 Chronic kidney disease, stage 3 (moderate): Secondary | ICD-10-CM | POA: Diagnosis not present

## 2017-04-02 DIAGNOSIS — Z6841 Body Mass Index (BMI) 40.0 and over, adult: Secondary | ICD-10-CM | POA: Diagnosis not present

## 2017-04-02 DIAGNOSIS — Z299 Encounter for prophylactic measures, unspecified: Secondary | ICD-10-CM | POA: Diagnosis not present

## 2017-04-02 DIAGNOSIS — E1142 Type 2 diabetes mellitus with diabetic polyneuropathy: Secondary | ICD-10-CM | POA: Diagnosis not present

## 2017-04-02 DIAGNOSIS — E1122 Type 2 diabetes mellitus with diabetic chronic kidney disease: Secondary | ICD-10-CM | POA: Diagnosis not present

## 2017-04-02 DIAGNOSIS — I10 Essential (primary) hypertension: Secondary | ICD-10-CM | POA: Diagnosis not present

## 2017-04-02 DIAGNOSIS — F3130 Bipolar disorder, current episode depressed, mild or moderate severity, unspecified: Secondary | ICD-10-CM | POA: Diagnosis not present

## 2017-04-17 DIAGNOSIS — F29 Unspecified psychosis not due to a substance or known physiological condition: Secondary | ICD-10-CM | POA: Diagnosis not present

## 2017-05-01 DIAGNOSIS — E1142 Type 2 diabetes mellitus with diabetic polyneuropathy: Secondary | ICD-10-CM | POA: Diagnosis not present

## 2017-05-01 DIAGNOSIS — E1122 Type 2 diabetes mellitus with diabetic chronic kidney disease: Secondary | ICD-10-CM | POA: Diagnosis not present

## 2017-05-01 DIAGNOSIS — Z789 Other specified health status: Secondary | ICD-10-CM | POA: Diagnosis not present

## 2017-05-01 DIAGNOSIS — I10 Essential (primary) hypertension: Secondary | ICD-10-CM | POA: Diagnosis not present

## 2017-05-01 DIAGNOSIS — Z6841 Body Mass Index (BMI) 40.0 and over, adult: Secondary | ICD-10-CM | POA: Diagnosis not present

## 2017-05-01 DIAGNOSIS — Z713 Dietary counseling and surveillance: Secondary | ICD-10-CM | POA: Diagnosis not present

## 2017-05-01 DIAGNOSIS — E1165 Type 2 diabetes mellitus with hyperglycemia: Secondary | ICD-10-CM | POA: Diagnosis not present

## 2017-05-01 DIAGNOSIS — N183 Chronic kidney disease, stage 3 (moderate): Secondary | ICD-10-CM | POA: Diagnosis not present

## 2017-05-01 DIAGNOSIS — Z299 Encounter for prophylactic measures, unspecified: Secondary | ICD-10-CM | POA: Diagnosis not present

## 2017-05-08 DIAGNOSIS — I10 Essential (primary) hypertension: Secondary | ICD-10-CM | POA: Diagnosis not present

## 2017-05-08 DIAGNOSIS — E119 Type 2 diabetes mellitus without complications: Secondary | ICD-10-CM | POA: Diagnosis not present

## 2017-05-08 DIAGNOSIS — E7800 Pure hypercholesterolemia, unspecified: Secondary | ICD-10-CM | POA: Diagnosis not present

## 2017-06-19 DIAGNOSIS — I129 Hypertensive chronic kidney disease with stage 1 through stage 4 chronic kidney disease, or unspecified chronic kidney disease: Secondary | ICD-10-CM | POA: Diagnosis not present

## 2017-06-19 DIAGNOSIS — N182 Chronic kidney disease, stage 2 (mild): Secondary | ICD-10-CM | POA: Diagnosis not present

## 2017-06-19 DIAGNOSIS — E559 Vitamin D deficiency, unspecified: Secondary | ICD-10-CM | POA: Diagnosis not present

## 2017-06-19 DIAGNOSIS — Z79899 Other long term (current) drug therapy: Secondary | ICD-10-CM | POA: Diagnosis not present

## 2017-06-19 DIAGNOSIS — E1122 Type 2 diabetes mellitus with diabetic chronic kidney disease: Secondary | ICD-10-CM | POA: Diagnosis not present

## 2017-06-19 DIAGNOSIS — D649 Anemia, unspecified: Secondary | ICD-10-CM | POA: Diagnosis not present

## 2017-06-22 DIAGNOSIS — M47814 Spondylosis without myelopathy or radiculopathy, thoracic region: Secondary | ICD-10-CM | POA: Diagnosis not present

## 2017-06-22 DIAGNOSIS — M545 Low back pain: Secondary | ICD-10-CM | POA: Diagnosis not present

## 2017-06-22 DIAGNOSIS — F3130 Bipolar disorder, current episode depressed, mild or moderate severity, unspecified: Secondary | ICD-10-CM | POA: Diagnosis not present

## 2017-06-22 DIAGNOSIS — M549 Dorsalgia, unspecified: Secondary | ICD-10-CM | POA: Diagnosis not present

## 2017-06-22 DIAGNOSIS — I10 Essential (primary) hypertension: Secondary | ICD-10-CM | POA: Diagnosis not present

## 2017-06-22 DIAGNOSIS — N183 Chronic kidney disease, stage 3 (moderate): Secondary | ICD-10-CM | POA: Diagnosis not present

## 2017-06-22 DIAGNOSIS — M48061 Spinal stenosis, lumbar region without neurogenic claudication: Secondary | ICD-10-CM | POA: Diagnosis not present

## 2017-06-22 DIAGNOSIS — Z6841 Body Mass Index (BMI) 40.0 and over, adult: Secondary | ICD-10-CM | POA: Diagnosis not present

## 2017-06-22 DIAGNOSIS — Z299 Encounter for prophylactic measures, unspecified: Secondary | ICD-10-CM | POA: Diagnosis not present

## 2017-06-23 DIAGNOSIS — N183 Chronic kidney disease, stage 3 (moderate): Secondary | ICD-10-CM | POA: Diagnosis not present

## 2017-06-23 DIAGNOSIS — E889 Metabolic disorder, unspecified: Secondary | ICD-10-CM | POA: Diagnosis not present

## 2017-06-23 DIAGNOSIS — E1129 Type 2 diabetes mellitus with other diabetic kidney complication: Secondary | ICD-10-CM | POA: Diagnosis not present

## 2017-06-23 DIAGNOSIS — M9080 Osteopathy in diseases classified elsewhere, unspecified site: Secondary | ICD-10-CM | POA: Diagnosis not present

## 2017-06-23 DIAGNOSIS — I10 Essential (primary) hypertension: Secondary | ICD-10-CM | POA: Diagnosis not present

## 2017-06-23 DIAGNOSIS — R809 Proteinuria, unspecified: Secondary | ICD-10-CM | POA: Diagnosis not present

## 2017-06-29 DIAGNOSIS — N183 Chronic kidney disease, stage 3 (moderate): Secondary | ICD-10-CM | POA: Diagnosis not present

## 2017-06-29 DIAGNOSIS — E1122 Type 2 diabetes mellitus with diabetic chronic kidney disease: Secondary | ICD-10-CM | POA: Diagnosis not present

## 2017-06-29 DIAGNOSIS — E1165 Type 2 diabetes mellitus with hyperglycemia: Secondary | ICD-10-CM | POA: Diagnosis not present

## 2017-06-29 DIAGNOSIS — Z6841 Body Mass Index (BMI) 40.0 and over, adult: Secondary | ICD-10-CM | POA: Diagnosis not present

## 2017-06-29 DIAGNOSIS — E1142 Type 2 diabetes mellitus with diabetic polyneuropathy: Secondary | ICD-10-CM | POA: Diagnosis not present

## 2017-06-29 DIAGNOSIS — I10 Essential (primary) hypertension: Secondary | ICD-10-CM | POA: Diagnosis not present

## 2017-06-29 DIAGNOSIS — Z299 Encounter for prophylactic measures, unspecified: Secondary | ICD-10-CM | POA: Diagnosis not present

## 2017-06-29 DIAGNOSIS — F3130 Bipolar disorder, current episode depressed, mild or moderate severity, unspecified: Secondary | ICD-10-CM | POA: Diagnosis not present

## 2017-07-13 DIAGNOSIS — N183 Chronic kidney disease, stage 3 (moderate): Secondary | ICD-10-CM | POA: Diagnosis not present

## 2017-07-13 DIAGNOSIS — Z6841 Body Mass Index (BMI) 40.0 and over, adult: Secondary | ICD-10-CM | POA: Diagnosis not present

## 2017-07-13 DIAGNOSIS — Z299 Encounter for prophylactic measures, unspecified: Secondary | ICD-10-CM | POA: Diagnosis not present

## 2017-07-13 DIAGNOSIS — F3130 Bipolar disorder, current episode depressed, mild or moderate severity, unspecified: Secondary | ICD-10-CM | POA: Diagnosis not present

## 2017-07-13 DIAGNOSIS — E1142 Type 2 diabetes mellitus with diabetic polyneuropathy: Secondary | ICD-10-CM | POA: Diagnosis not present

## 2017-07-13 DIAGNOSIS — E1165 Type 2 diabetes mellitus with hyperglycemia: Secondary | ICD-10-CM | POA: Diagnosis not present

## 2017-07-13 DIAGNOSIS — E1122 Type 2 diabetes mellitus with diabetic chronic kidney disease: Secondary | ICD-10-CM | POA: Diagnosis not present

## 2017-07-27 DIAGNOSIS — E1142 Type 2 diabetes mellitus with diabetic polyneuropathy: Secondary | ICD-10-CM | POA: Diagnosis not present

## 2017-07-27 DIAGNOSIS — Z299 Encounter for prophylactic measures, unspecified: Secondary | ICD-10-CM | POA: Diagnosis not present

## 2017-07-27 DIAGNOSIS — N183 Chronic kidney disease, stage 3 (moderate): Secondary | ICD-10-CM | POA: Diagnosis not present

## 2017-07-27 DIAGNOSIS — E1165 Type 2 diabetes mellitus with hyperglycemia: Secondary | ICD-10-CM | POA: Diagnosis not present

## 2017-07-27 DIAGNOSIS — F319 Bipolar disorder, unspecified: Secondary | ICD-10-CM | POA: Diagnosis not present

## 2017-07-27 DIAGNOSIS — E1122 Type 2 diabetes mellitus with diabetic chronic kidney disease: Secondary | ICD-10-CM | POA: Diagnosis not present

## 2017-07-27 DIAGNOSIS — Z6841 Body Mass Index (BMI) 40.0 and over, adult: Secondary | ICD-10-CM | POA: Diagnosis not present

## 2017-08-18 DIAGNOSIS — F29 Unspecified psychosis not due to a substance or known physiological condition: Secondary | ICD-10-CM | POA: Diagnosis not present

## 2017-08-26 DIAGNOSIS — F319 Bipolar disorder, unspecified: Secondary | ICD-10-CM | POA: Diagnosis not present

## 2017-08-26 DIAGNOSIS — E1142 Type 2 diabetes mellitus with diabetic polyneuropathy: Secondary | ICD-10-CM | POA: Diagnosis not present

## 2017-08-26 DIAGNOSIS — Z6841 Body Mass Index (BMI) 40.0 and over, adult: Secondary | ICD-10-CM | POA: Diagnosis not present

## 2017-08-26 DIAGNOSIS — E1165 Type 2 diabetes mellitus with hyperglycemia: Secondary | ICD-10-CM | POA: Diagnosis not present

## 2017-08-26 DIAGNOSIS — E1122 Type 2 diabetes mellitus with diabetic chronic kidney disease: Secondary | ICD-10-CM | POA: Diagnosis not present

## 2017-08-26 DIAGNOSIS — N183 Chronic kidney disease, stage 3 (moderate): Secondary | ICD-10-CM | POA: Diagnosis not present

## 2017-08-26 DIAGNOSIS — F419 Anxiety disorder, unspecified: Secondary | ICD-10-CM | POA: Diagnosis not present

## 2017-08-26 DIAGNOSIS — Z299 Encounter for prophylactic measures, unspecified: Secondary | ICD-10-CM | POA: Diagnosis not present

## 2017-09-25 DIAGNOSIS — E1122 Type 2 diabetes mellitus with diabetic chronic kidney disease: Secondary | ICD-10-CM | POA: Diagnosis not present

## 2017-09-25 DIAGNOSIS — E1165 Type 2 diabetes mellitus with hyperglycemia: Secondary | ICD-10-CM | POA: Diagnosis not present

## 2017-09-25 DIAGNOSIS — Z6841 Body Mass Index (BMI) 40.0 and over, adult: Secondary | ICD-10-CM | POA: Diagnosis not present

## 2017-09-25 DIAGNOSIS — E1142 Type 2 diabetes mellitus with diabetic polyneuropathy: Secondary | ICD-10-CM | POA: Diagnosis not present

## 2017-09-25 DIAGNOSIS — F319 Bipolar disorder, unspecified: Secondary | ICD-10-CM | POA: Diagnosis not present

## 2017-09-25 DIAGNOSIS — N183 Chronic kidney disease, stage 3 (moderate): Secondary | ICD-10-CM | POA: Diagnosis not present

## 2017-09-25 DIAGNOSIS — Z299 Encounter for prophylactic measures, unspecified: Secondary | ICD-10-CM | POA: Diagnosis not present

## 2017-09-25 DIAGNOSIS — I10 Essential (primary) hypertension: Secondary | ICD-10-CM | POA: Diagnosis not present

## 2017-10-20 DIAGNOSIS — I10 Essential (primary) hypertension: Secondary | ICD-10-CM | POA: Diagnosis not present

## 2017-10-20 DIAGNOSIS — Z6841 Body Mass Index (BMI) 40.0 and over, adult: Secondary | ICD-10-CM | POA: Diagnosis not present

## 2017-10-20 DIAGNOSIS — R110 Nausea: Secondary | ICD-10-CM | POA: Diagnosis not present

## 2017-10-20 DIAGNOSIS — R350 Frequency of micturition: Secondary | ICD-10-CM | POA: Diagnosis not present

## 2017-10-20 DIAGNOSIS — Z299 Encounter for prophylactic measures, unspecified: Secondary | ICD-10-CM | POA: Diagnosis not present

## 2017-10-20 DIAGNOSIS — R000 Tachycardia, unspecified: Secondary | ICD-10-CM | POA: Diagnosis not present

## 2017-10-21 DIAGNOSIS — N182 Chronic kidney disease, stage 2 (mild): Secondary | ICD-10-CM | POA: Diagnosis not present

## 2017-10-21 DIAGNOSIS — Z79899 Other long term (current) drug therapy: Secondary | ICD-10-CM | POA: Diagnosis not present

## 2017-10-21 DIAGNOSIS — D649 Anemia, unspecified: Secondary | ICD-10-CM | POA: Diagnosis not present

## 2017-10-21 DIAGNOSIS — I129 Hypertensive chronic kidney disease with stage 1 through stage 4 chronic kidney disease, or unspecified chronic kidney disease: Secondary | ICD-10-CM | POA: Diagnosis not present

## 2017-10-21 DIAGNOSIS — E559 Vitamin D deficiency, unspecified: Secondary | ICD-10-CM | POA: Diagnosis not present

## 2017-10-21 DIAGNOSIS — E1122 Type 2 diabetes mellitus with diabetic chronic kidney disease: Secondary | ICD-10-CM | POA: Diagnosis not present

## 2017-10-23 DIAGNOSIS — Z299 Encounter for prophylactic measures, unspecified: Secondary | ICD-10-CM | POA: Diagnosis not present

## 2017-10-23 DIAGNOSIS — R350 Frequency of micturition: Secondary | ICD-10-CM | POA: Diagnosis not present

## 2017-10-23 DIAGNOSIS — N3090 Cystitis, unspecified without hematuria: Secondary | ICD-10-CM | POA: Diagnosis not present

## 2017-10-23 DIAGNOSIS — I10 Essential (primary) hypertension: Secondary | ICD-10-CM | POA: Diagnosis not present

## 2017-10-23 DIAGNOSIS — E1165 Type 2 diabetes mellitus with hyperglycemia: Secondary | ICD-10-CM | POA: Diagnosis not present

## 2017-10-23 DIAGNOSIS — Z6841 Body Mass Index (BMI) 40.0 and over, adult: Secondary | ICD-10-CM | POA: Diagnosis not present

## 2017-10-23 DIAGNOSIS — M7050 Other bursitis of knee, unspecified knee: Secondary | ICD-10-CM | POA: Diagnosis not present

## 2017-10-24 DIAGNOSIS — I10 Essential (primary) hypertension: Secondary | ICD-10-CM | POA: Diagnosis not present

## 2017-10-24 DIAGNOSIS — K5900 Constipation, unspecified: Secondary | ICD-10-CM | POA: Diagnosis not present

## 2017-10-24 DIAGNOSIS — Z79899 Other long term (current) drug therapy: Secondary | ICD-10-CM | POA: Diagnosis not present

## 2017-10-24 DIAGNOSIS — F319 Bipolar disorder, unspecified: Secondary | ICD-10-CM | POA: Diagnosis not present

## 2017-10-24 DIAGNOSIS — Z888 Allergy status to other drugs, medicaments and biological substances status: Secondary | ICD-10-CM | POA: Diagnosis not present

## 2017-10-24 DIAGNOSIS — Z87891 Personal history of nicotine dependence: Secondary | ICD-10-CM | POA: Diagnosis not present

## 2017-10-24 DIAGNOSIS — K5641 Fecal impaction: Secondary | ICD-10-CM | POA: Diagnosis not present

## 2017-10-24 DIAGNOSIS — E7800 Pure hypercholesterolemia, unspecified: Secondary | ICD-10-CM | POA: Diagnosis not present

## 2017-10-24 DIAGNOSIS — Z7982 Long term (current) use of aspirin: Secondary | ICD-10-CM | POA: Diagnosis not present

## 2017-10-24 DIAGNOSIS — F419 Anxiety disorder, unspecified: Secondary | ICD-10-CM | POA: Diagnosis not present

## 2017-10-24 DIAGNOSIS — Z9049 Acquired absence of other specified parts of digestive tract: Secondary | ICD-10-CM | POA: Diagnosis not present

## 2017-10-24 DIAGNOSIS — E119 Type 2 diabetes mellitus without complications: Secondary | ICD-10-CM | POA: Diagnosis not present

## 2017-10-24 DIAGNOSIS — R140 Abdominal distension (gaseous): Secondary | ICD-10-CM | POA: Diagnosis not present

## 2017-10-26 DIAGNOSIS — Z6841 Body Mass Index (BMI) 40.0 and over, adult: Secondary | ICD-10-CM | POA: Diagnosis not present

## 2017-10-26 DIAGNOSIS — E1165 Type 2 diabetes mellitus with hyperglycemia: Secondary | ICD-10-CM | POA: Diagnosis not present

## 2017-10-26 DIAGNOSIS — K5900 Constipation, unspecified: Secondary | ICD-10-CM | POA: Diagnosis not present

## 2017-10-26 DIAGNOSIS — M7050 Other bursitis of knee, unspecified knee: Secondary | ICD-10-CM | POA: Diagnosis not present

## 2017-10-26 DIAGNOSIS — I10 Essential (primary) hypertension: Secondary | ICD-10-CM | POA: Diagnosis not present

## 2017-10-26 DIAGNOSIS — E162 Hypoglycemia, unspecified: Secondary | ICD-10-CM | POA: Diagnosis not present

## 2017-10-26 DIAGNOSIS — Z299 Encounter for prophylactic measures, unspecified: Secondary | ICD-10-CM | POA: Diagnosis not present

## 2017-10-27 DIAGNOSIS — Z79899 Other long term (current) drug therapy: Secondary | ICD-10-CM | POA: Diagnosis not present

## 2017-10-27 DIAGNOSIS — K5900 Constipation, unspecified: Secondary | ICD-10-CM | POA: Diagnosis not present

## 2017-10-27 DIAGNOSIS — Z87891 Personal history of nicotine dependence: Secondary | ICD-10-CM | POA: Diagnosis not present

## 2017-10-27 DIAGNOSIS — Z794 Long term (current) use of insulin: Secondary | ICD-10-CM | POA: Diagnosis not present

## 2017-10-27 DIAGNOSIS — R1030 Lower abdominal pain, unspecified: Secondary | ICD-10-CM | POA: Diagnosis not present

## 2017-10-27 DIAGNOSIS — E119 Type 2 diabetes mellitus without complications: Secondary | ICD-10-CM | POA: Diagnosis not present

## 2017-10-27 DIAGNOSIS — F419 Anxiety disorder, unspecified: Secondary | ICD-10-CM | POA: Diagnosis not present

## 2017-10-27 DIAGNOSIS — K5904 Chronic idiopathic constipation: Secondary | ICD-10-CM | POA: Diagnosis not present

## 2017-10-27 DIAGNOSIS — E7800 Pure hypercholesterolemia, unspecified: Secondary | ICD-10-CM | POA: Diagnosis not present

## 2017-10-27 DIAGNOSIS — I10 Essential (primary) hypertension: Secondary | ICD-10-CM | POA: Diagnosis not present

## 2017-11-02 DIAGNOSIS — Z6841 Body Mass Index (BMI) 40.0 and over, adult: Secondary | ICD-10-CM | POA: Diagnosis not present

## 2017-11-02 DIAGNOSIS — Z299 Encounter for prophylactic measures, unspecified: Secondary | ICD-10-CM | POA: Diagnosis not present

## 2017-11-02 DIAGNOSIS — K5900 Constipation, unspecified: Secondary | ICD-10-CM | POA: Diagnosis not present

## 2017-11-02 DIAGNOSIS — M1712 Unilateral primary osteoarthritis, left knee: Secondary | ICD-10-CM | POA: Diagnosis not present

## 2017-11-02 DIAGNOSIS — E1122 Type 2 diabetes mellitus with diabetic chronic kidney disease: Secondary | ICD-10-CM | POA: Diagnosis not present

## 2017-11-02 DIAGNOSIS — I10 Essential (primary) hypertension: Secondary | ICD-10-CM | POA: Diagnosis not present

## 2017-11-02 DIAGNOSIS — E1165 Type 2 diabetes mellitus with hyperglycemia: Secondary | ICD-10-CM | POA: Diagnosis not present

## 2017-11-02 DIAGNOSIS — E1142 Type 2 diabetes mellitus with diabetic polyneuropathy: Secondary | ICD-10-CM | POA: Diagnosis not present

## 2017-11-03 DIAGNOSIS — F310 Bipolar disorder, current episode hypomanic: Secondary | ICD-10-CM | POA: Diagnosis not present

## 2017-11-03 DIAGNOSIS — N183 Chronic kidney disease, stage 3 (moderate): Secondary | ICD-10-CM | POA: Diagnosis not present

## 2017-11-03 DIAGNOSIS — E1129 Type 2 diabetes mellitus with other diabetic kidney complication: Secondary | ICD-10-CM | POA: Diagnosis not present

## 2017-11-24 DIAGNOSIS — I10 Essential (primary) hypertension: Secondary | ICD-10-CM | POA: Diagnosis not present

## 2017-11-24 DIAGNOSIS — E1122 Type 2 diabetes mellitus with diabetic chronic kidney disease: Secondary | ICD-10-CM | POA: Diagnosis not present

## 2017-11-24 DIAGNOSIS — Z299 Encounter for prophylactic measures, unspecified: Secondary | ICD-10-CM | POA: Diagnosis not present

## 2017-11-24 DIAGNOSIS — E1142 Type 2 diabetes mellitus with diabetic polyneuropathy: Secondary | ICD-10-CM | POA: Diagnosis not present

## 2017-11-24 DIAGNOSIS — E1165 Type 2 diabetes mellitus with hyperglycemia: Secondary | ICD-10-CM | POA: Diagnosis not present

## 2017-11-24 DIAGNOSIS — N183 Chronic kidney disease, stage 3 (moderate): Secondary | ICD-10-CM | POA: Diagnosis not present

## 2017-12-04 DIAGNOSIS — E119 Type 2 diabetes mellitus without complications: Secondary | ICD-10-CM | POA: Diagnosis not present

## 2017-12-04 DIAGNOSIS — I10 Essential (primary) hypertension: Secondary | ICD-10-CM | POA: Diagnosis not present

## 2017-12-04 DIAGNOSIS — E7800 Pure hypercholesterolemia, unspecified: Secondary | ICD-10-CM | POA: Diagnosis not present

## 2017-12-08 DIAGNOSIS — F29 Unspecified psychosis not due to a substance or known physiological condition: Secondary | ICD-10-CM | POA: Diagnosis not present

## 2017-12-31 DIAGNOSIS — I129 Hypertensive chronic kidney disease with stage 1 through stage 4 chronic kidney disease, or unspecified chronic kidney disease: Secondary | ICD-10-CM | POA: Diagnosis not present

## 2017-12-31 DIAGNOSIS — R809 Proteinuria, unspecified: Secondary | ICD-10-CM | POA: Diagnosis not present

## 2017-12-31 DIAGNOSIS — D509 Iron deficiency anemia, unspecified: Secondary | ICD-10-CM | POA: Diagnosis not present

## 2017-12-31 DIAGNOSIS — N183 Chronic kidney disease, stage 3 (moderate): Secondary | ICD-10-CM | POA: Diagnosis not present

## 2017-12-31 DIAGNOSIS — Z79899 Other long term (current) drug therapy: Secondary | ICD-10-CM | POA: Diagnosis not present

## 2017-12-31 DIAGNOSIS — E559 Vitamin D deficiency, unspecified: Secondary | ICD-10-CM | POA: Diagnosis not present

## 2018-01-05 DIAGNOSIS — E1129 Type 2 diabetes mellitus with other diabetic kidney complication: Secondary | ICD-10-CM | POA: Diagnosis not present

## 2018-01-05 DIAGNOSIS — N183 Chronic kidney disease, stage 3 (moderate): Secondary | ICD-10-CM | POA: Diagnosis not present

## 2018-01-05 DIAGNOSIS — I10 Essential (primary) hypertension: Secondary | ICD-10-CM | POA: Diagnosis not present

## 2018-02-15 DIAGNOSIS — E0590 Thyrotoxicosis, unspecified without thyrotoxic crisis or storm: Secondary | ICD-10-CM | POA: Diagnosis not present

## 2018-02-15 DIAGNOSIS — Z0000 Encounter for general adult medical examination without abnormal findings: Secondary | ICD-10-CM | POA: Diagnosis not present

## 2018-02-15 DIAGNOSIS — Z1331 Encounter for screening for depression: Secondary | ICD-10-CM | POA: Diagnosis not present

## 2018-02-15 DIAGNOSIS — Z299 Encounter for prophylactic measures, unspecified: Secondary | ICD-10-CM | POA: Diagnosis not present

## 2018-02-15 DIAGNOSIS — R42 Dizziness and giddiness: Secondary | ICD-10-CM | POA: Diagnosis not present

## 2018-02-15 DIAGNOSIS — E7800 Pure hypercholesterolemia, unspecified: Secondary | ICD-10-CM | POA: Diagnosis not present

## 2018-02-15 DIAGNOSIS — Z1211 Encounter for screening for malignant neoplasm of colon: Secondary | ICD-10-CM | POA: Diagnosis not present

## 2018-02-15 DIAGNOSIS — E1165 Type 2 diabetes mellitus with hyperglycemia: Secondary | ICD-10-CM | POA: Diagnosis not present

## 2018-02-15 DIAGNOSIS — Z7189 Other specified counseling: Secondary | ICD-10-CM | POA: Diagnosis not present

## 2018-02-15 DIAGNOSIS — Z6841 Body Mass Index (BMI) 40.0 and over, adult: Secondary | ICD-10-CM | POA: Diagnosis not present

## 2018-02-15 DIAGNOSIS — Z1339 Encounter for screening examination for other mental health and behavioral disorders: Secondary | ICD-10-CM | POA: Diagnosis not present

## 2018-02-16 DIAGNOSIS — E1159 Type 2 diabetes mellitus with other circulatory complications: Secondary | ICD-10-CM | POA: Diagnosis not present

## 2018-02-16 DIAGNOSIS — H81393 Other peripheral vertigo, bilateral: Secondary | ICD-10-CM | POA: Diagnosis not present

## 2018-02-16 DIAGNOSIS — E1140 Type 2 diabetes mellitus with diabetic neuropathy, unspecified: Secondary | ICD-10-CM | POA: Diagnosis not present

## 2018-02-18 DIAGNOSIS — I10 Essential (primary) hypertension: Secondary | ICD-10-CM | POA: Diagnosis not present

## 2018-02-18 DIAGNOSIS — E119 Type 2 diabetes mellitus without complications: Secondary | ICD-10-CM | POA: Diagnosis not present

## 2018-02-18 DIAGNOSIS — E7800 Pure hypercholesterolemia, unspecified: Secondary | ICD-10-CM | POA: Diagnosis not present

## 2018-02-19 DIAGNOSIS — E7800 Pure hypercholesterolemia, unspecified: Secondary | ICD-10-CM | POA: Diagnosis not present

## 2018-02-19 DIAGNOSIS — R5383 Other fatigue: Secondary | ICD-10-CM | POA: Diagnosis not present

## 2018-02-19 DIAGNOSIS — E0590 Thyrotoxicosis, unspecified without thyrotoxic crisis or storm: Secondary | ICD-10-CM | POA: Diagnosis not present

## 2018-02-19 DIAGNOSIS — E559 Vitamin D deficiency, unspecified: Secondary | ICD-10-CM | POA: Diagnosis not present

## 2018-02-19 DIAGNOSIS — Z79899 Other long term (current) drug therapy: Secondary | ICD-10-CM | POA: Diagnosis not present

## 2018-03-05 DIAGNOSIS — E2839 Other primary ovarian failure: Secondary | ICD-10-CM | POA: Diagnosis not present

## 2018-03-12 DIAGNOSIS — E1165 Type 2 diabetes mellitus with hyperglycemia: Secondary | ICD-10-CM | POA: Diagnosis not present

## 2018-03-12 DIAGNOSIS — I10 Essential (primary) hypertension: Secondary | ICD-10-CM | POA: Diagnosis not present

## 2018-03-12 DIAGNOSIS — Z299 Encounter for prophylactic measures, unspecified: Secondary | ICD-10-CM | POA: Diagnosis not present

## 2018-03-12 DIAGNOSIS — R42 Dizziness and giddiness: Secondary | ICD-10-CM | POA: Diagnosis not present

## 2018-03-12 DIAGNOSIS — R21 Rash and other nonspecific skin eruption: Secondary | ICD-10-CM | POA: Diagnosis not present

## 2018-03-12 DIAGNOSIS — Z6841 Body Mass Index (BMI) 40.0 and over, adult: Secondary | ICD-10-CM | POA: Diagnosis not present

## 2018-03-25 DIAGNOSIS — Z299 Encounter for prophylactic measures, unspecified: Secondary | ICD-10-CM | POA: Diagnosis not present

## 2018-03-25 DIAGNOSIS — R300 Dysuria: Secondary | ICD-10-CM | POA: Diagnosis not present

## 2018-03-25 DIAGNOSIS — Z6841 Body Mass Index (BMI) 40.0 and over, adult: Secondary | ICD-10-CM | POA: Diagnosis not present

## 2018-03-25 DIAGNOSIS — K529 Noninfective gastroenteritis and colitis, unspecified: Secondary | ICD-10-CM | POA: Diagnosis not present

## 2018-03-25 DIAGNOSIS — I10 Essential (primary) hypertension: Secondary | ICD-10-CM | POA: Diagnosis not present

## 2018-03-26 DIAGNOSIS — F29 Unspecified psychosis not due to a substance or known physiological condition: Secondary | ICD-10-CM | POA: Diagnosis not present

## 2018-03-30 DIAGNOSIS — R682 Dry mouth, unspecified: Secondary | ICD-10-CM | POA: Diagnosis not present

## 2018-03-30 DIAGNOSIS — Z299 Encounter for prophylactic measures, unspecified: Secondary | ICD-10-CM | POA: Diagnosis not present

## 2018-03-30 DIAGNOSIS — I10 Essential (primary) hypertension: Secondary | ICD-10-CM | POA: Diagnosis not present

## 2018-03-30 DIAGNOSIS — Z6841 Body Mass Index (BMI) 40.0 and over, adult: Secondary | ICD-10-CM | POA: Diagnosis not present

## 2018-04-23 DIAGNOSIS — I10 Essential (primary) hypertension: Secondary | ICD-10-CM | POA: Diagnosis not present

## 2018-04-23 DIAGNOSIS — E7800 Pure hypercholesterolemia, unspecified: Secondary | ICD-10-CM | POA: Diagnosis not present

## 2018-04-23 DIAGNOSIS — E119 Type 2 diabetes mellitus without complications: Secondary | ICD-10-CM | POA: Diagnosis not present

## 2018-05-05 DIAGNOSIS — E889 Metabolic disorder, unspecified: Secondary | ICD-10-CM | POA: Diagnosis not present

## 2018-05-05 DIAGNOSIS — R809 Proteinuria, unspecified: Secondary | ICD-10-CM | POA: Diagnosis not present

## 2018-05-05 DIAGNOSIS — N183 Chronic kidney disease, stage 3 (moderate): Secondary | ICD-10-CM | POA: Diagnosis not present

## 2018-05-05 DIAGNOSIS — Z79899 Other long term (current) drug therapy: Secondary | ICD-10-CM | POA: Diagnosis not present

## 2018-05-05 DIAGNOSIS — E1122 Type 2 diabetes mellitus with diabetic chronic kidney disease: Secondary | ICD-10-CM | POA: Diagnosis not present

## 2018-05-05 DIAGNOSIS — D649 Anemia, unspecified: Secondary | ICD-10-CM | POA: Diagnosis not present

## 2018-05-05 DIAGNOSIS — M9080 Osteopathy in diseases classified elsewhere, unspecified site: Secondary | ICD-10-CM | POA: Diagnosis not present

## 2018-05-14 ENCOUNTER — Encounter (INDEPENDENT_AMBULATORY_CARE_PROVIDER_SITE_OTHER): Payer: Self-pay | Admitting: *Deleted

## 2018-05-24 DIAGNOSIS — Z6841 Body Mass Index (BMI) 40.0 and over, adult: Secondary | ICD-10-CM | POA: Diagnosis not present

## 2018-05-24 DIAGNOSIS — M1710 Unilateral primary osteoarthritis, unspecified knee: Secondary | ICD-10-CM | POA: Diagnosis not present

## 2018-05-24 DIAGNOSIS — I10 Essential (primary) hypertension: Secondary | ICD-10-CM | POA: Diagnosis not present

## 2018-05-24 DIAGNOSIS — E611 Iron deficiency: Secondary | ICD-10-CM | POA: Diagnosis not present

## 2018-05-24 DIAGNOSIS — Z299 Encounter for prophylactic measures, unspecified: Secondary | ICD-10-CM | POA: Diagnosis not present

## 2018-05-24 DIAGNOSIS — E1165 Type 2 diabetes mellitus with hyperglycemia: Secondary | ICD-10-CM | POA: Diagnosis not present

## 2018-05-24 DIAGNOSIS — F319 Bipolar disorder, unspecified: Secondary | ICD-10-CM | POA: Diagnosis not present

## 2018-05-24 DIAGNOSIS — Z789 Other specified health status: Secondary | ICD-10-CM | POA: Diagnosis not present

## 2018-06-10 DIAGNOSIS — I10 Essential (primary) hypertension: Secondary | ICD-10-CM | POA: Diagnosis not present

## 2018-06-10 DIAGNOSIS — E7800 Pure hypercholesterolemia, unspecified: Secondary | ICD-10-CM | POA: Diagnosis not present

## 2018-06-10 DIAGNOSIS — E119 Type 2 diabetes mellitus without complications: Secondary | ICD-10-CM | POA: Diagnosis not present

## 2018-07-05 DIAGNOSIS — E1165 Type 2 diabetes mellitus with hyperglycemia: Secondary | ICD-10-CM | POA: Diagnosis not present

## 2018-07-05 DIAGNOSIS — Z2821 Immunization not carried out because of patient refusal: Secondary | ICD-10-CM | POA: Diagnosis not present

## 2018-07-05 DIAGNOSIS — Z6841 Body Mass Index (BMI) 40.0 and over, adult: Secondary | ICD-10-CM | POA: Diagnosis not present

## 2018-07-05 DIAGNOSIS — N183 Chronic kidney disease, stage 3 (moderate): Secondary | ICD-10-CM | POA: Diagnosis not present

## 2018-07-05 DIAGNOSIS — E1122 Type 2 diabetes mellitus with diabetic chronic kidney disease: Secondary | ICD-10-CM | POA: Diagnosis not present

## 2018-07-05 DIAGNOSIS — I10 Essential (primary) hypertension: Secondary | ICD-10-CM | POA: Diagnosis not present

## 2018-07-05 DIAGNOSIS — E1142 Type 2 diabetes mellitus with diabetic polyneuropathy: Secondary | ICD-10-CM | POA: Diagnosis not present

## 2018-07-05 DIAGNOSIS — Z299 Encounter for prophylactic measures, unspecified: Secondary | ICD-10-CM | POA: Diagnosis not present

## 2018-07-09 DIAGNOSIS — F29 Unspecified psychosis not due to a substance or known physiological condition: Secondary | ICD-10-CM | POA: Diagnosis not present

## 2018-07-13 DIAGNOSIS — I10 Essential (primary) hypertension: Secondary | ICD-10-CM | POA: Diagnosis not present

## 2018-07-13 DIAGNOSIS — E7800 Pure hypercholesterolemia, unspecified: Secondary | ICD-10-CM | POA: Diagnosis not present

## 2018-07-13 DIAGNOSIS — E119 Type 2 diabetes mellitus without complications: Secondary | ICD-10-CM | POA: Diagnosis not present

## 2018-08-05 DIAGNOSIS — I10 Essential (primary) hypertension: Secondary | ICD-10-CM | POA: Diagnosis not present

## 2018-08-05 DIAGNOSIS — E7800 Pure hypercholesterolemia, unspecified: Secondary | ICD-10-CM | POA: Diagnosis not present

## 2018-08-05 DIAGNOSIS — E119 Type 2 diabetes mellitus without complications: Secondary | ICD-10-CM | POA: Diagnosis not present

## 2018-08-30 DIAGNOSIS — E1165 Type 2 diabetes mellitus with hyperglycemia: Secondary | ICD-10-CM | POA: Diagnosis not present

## 2018-08-30 DIAGNOSIS — E1122 Type 2 diabetes mellitus with diabetic chronic kidney disease: Secondary | ICD-10-CM | POA: Diagnosis not present

## 2018-08-30 DIAGNOSIS — N183 Chronic kidney disease, stage 3 (moderate): Secondary | ICD-10-CM | POA: Diagnosis not present

## 2018-08-30 DIAGNOSIS — Z299 Encounter for prophylactic measures, unspecified: Secondary | ICD-10-CM | POA: Diagnosis not present

## 2018-08-30 DIAGNOSIS — K219 Gastro-esophageal reflux disease without esophagitis: Secondary | ICD-10-CM | POA: Diagnosis not present

## 2018-08-30 DIAGNOSIS — Z6841 Body Mass Index (BMI) 40.0 and over, adult: Secondary | ICD-10-CM | POA: Diagnosis not present

## 2018-09-03 DIAGNOSIS — I10 Essential (primary) hypertension: Secondary | ICD-10-CM | POA: Diagnosis not present

## 2018-09-03 DIAGNOSIS — E119 Type 2 diabetes mellitus without complications: Secondary | ICD-10-CM | POA: Diagnosis not present

## 2018-09-03 DIAGNOSIS — E7800 Pure hypercholesterolemia, unspecified: Secondary | ICD-10-CM | POA: Diagnosis not present

## 2018-10-12 DIAGNOSIS — Z299 Encounter for prophylactic measures, unspecified: Secondary | ICD-10-CM | POA: Diagnosis not present

## 2018-10-12 DIAGNOSIS — E1142 Type 2 diabetes mellitus with diabetic polyneuropathy: Secondary | ICD-10-CM | POA: Diagnosis not present

## 2018-10-12 DIAGNOSIS — I10 Essential (primary) hypertension: Secondary | ICD-10-CM | POA: Diagnosis not present

## 2018-10-12 DIAGNOSIS — E1165 Type 2 diabetes mellitus with hyperglycemia: Secondary | ICD-10-CM | POA: Diagnosis not present

## 2018-10-12 DIAGNOSIS — E1122 Type 2 diabetes mellitus with diabetic chronic kidney disease: Secondary | ICD-10-CM | POA: Diagnosis not present

## 2018-10-12 DIAGNOSIS — Z6841 Body Mass Index (BMI) 40.0 and over, adult: Secondary | ICD-10-CM | POA: Diagnosis not present

## 2018-10-26 DIAGNOSIS — E1165 Type 2 diabetes mellitus with hyperglycemia: Secondary | ICD-10-CM | POA: Diagnosis not present

## 2018-10-26 DIAGNOSIS — N183 Chronic kidney disease, stage 3 (moderate): Secondary | ICD-10-CM | POA: Diagnosis not present

## 2018-10-26 DIAGNOSIS — I10 Essential (primary) hypertension: Secondary | ICD-10-CM | POA: Diagnosis not present

## 2018-10-26 DIAGNOSIS — E1122 Type 2 diabetes mellitus with diabetic chronic kidney disease: Secondary | ICD-10-CM | POA: Diagnosis not present

## 2018-10-26 DIAGNOSIS — Z6841 Body Mass Index (BMI) 40.0 and over, adult: Secondary | ICD-10-CM | POA: Diagnosis not present

## 2018-10-26 DIAGNOSIS — Z299 Encounter for prophylactic measures, unspecified: Secondary | ICD-10-CM | POA: Diagnosis not present

## 2018-11-05 DIAGNOSIS — R809 Proteinuria, unspecified: Secondary | ICD-10-CM | POA: Diagnosis not present

## 2018-11-05 DIAGNOSIS — I129 Hypertensive chronic kidney disease with stage 1 through stage 4 chronic kidney disease, or unspecified chronic kidney disease: Secondary | ICD-10-CM | POA: Diagnosis not present

## 2018-11-05 DIAGNOSIS — E559 Vitamin D deficiency, unspecified: Secondary | ICD-10-CM | POA: Diagnosis not present

## 2018-11-05 DIAGNOSIS — Z79899 Other long term (current) drug therapy: Secondary | ICD-10-CM | POA: Diagnosis not present

## 2018-11-05 DIAGNOSIS — D649 Anemia, unspecified: Secondary | ICD-10-CM | POA: Diagnosis not present

## 2018-11-05 DIAGNOSIS — N183 Chronic kidney disease, stage 3 (moderate): Secondary | ICD-10-CM | POA: Diagnosis not present

## 2018-11-09 DIAGNOSIS — E119 Type 2 diabetes mellitus without complications: Secondary | ICD-10-CM | POA: Diagnosis not present

## 2018-11-09 DIAGNOSIS — I10 Essential (primary) hypertension: Secondary | ICD-10-CM | POA: Diagnosis not present

## 2018-11-09 DIAGNOSIS — E7800 Pure hypercholesterolemia, unspecified: Secondary | ICD-10-CM | POA: Diagnosis not present

## 2018-11-26 DIAGNOSIS — E1165 Type 2 diabetes mellitus with hyperglycemia: Secondary | ICD-10-CM | POA: Diagnosis not present

## 2018-11-26 DIAGNOSIS — I10 Essential (primary) hypertension: Secondary | ICD-10-CM | POA: Diagnosis not present

## 2018-11-26 DIAGNOSIS — E1122 Type 2 diabetes mellitus with diabetic chronic kidney disease: Secondary | ICD-10-CM | POA: Diagnosis not present

## 2018-11-26 DIAGNOSIS — Z6841 Body Mass Index (BMI) 40.0 and over, adult: Secondary | ICD-10-CM | POA: Diagnosis not present

## 2018-11-26 DIAGNOSIS — Z299 Encounter for prophylactic measures, unspecified: Secondary | ICD-10-CM | POA: Diagnosis not present

## 2018-11-26 DIAGNOSIS — E1142 Type 2 diabetes mellitus with diabetic polyneuropathy: Secondary | ICD-10-CM | POA: Diagnosis not present

## 2018-12-20 DIAGNOSIS — E119 Type 2 diabetes mellitus without complications: Secondary | ICD-10-CM | POA: Diagnosis not present

## 2018-12-24 DIAGNOSIS — F29 Unspecified psychosis not due to a substance or known physiological condition: Secondary | ICD-10-CM | POA: Diagnosis not present

## 2018-12-30 DIAGNOSIS — Z299 Encounter for prophylactic measures, unspecified: Secondary | ICD-10-CM | POA: Diagnosis not present

## 2018-12-30 DIAGNOSIS — I10 Essential (primary) hypertension: Secondary | ICD-10-CM | POA: Diagnosis not present

## 2018-12-30 DIAGNOSIS — E1165 Type 2 diabetes mellitus with hyperglycemia: Secondary | ICD-10-CM | POA: Diagnosis not present

## 2018-12-30 DIAGNOSIS — Z6841 Body Mass Index (BMI) 40.0 and over, adult: Secondary | ICD-10-CM | POA: Diagnosis not present

## 2018-12-30 DIAGNOSIS — E1122 Type 2 diabetes mellitus with diabetic chronic kidney disease: Secondary | ICD-10-CM | POA: Diagnosis not present

## 2018-12-30 DIAGNOSIS — R21 Rash and other nonspecific skin eruption: Secondary | ICD-10-CM | POA: Diagnosis not present

## 2019-01-18 DIAGNOSIS — E119 Type 2 diabetes mellitus without complications: Secondary | ICD-10-CM | POA: Diagnosis not present

## 2019-01-31 DIAGNOSIS — E1165 Type 2 diabetes mellitus with hyperglycemia: Secondary | ICD-10-CM | POA: Diagnosis not present

## 2019-01-31 DIAGNOSIS — E1122 Type 2 diabetes mellitus with diabetic chronic kidney disease: Secondary | ICD-10-CM | POA: Diagnosis not present

## 2019-01-31 DIAGNOSIS — E1142 Type 2 diabetes mellitus with diabetic polyneuropathy: Secondary | ICD-10-CM | POA: Diagnosis not present

## 2019-01-31 DIAGNOSIS — I10 Essential (primary) hypertension: Secondary | ICD-10-CM | POA: Diagnosis not present

## 2019-01-31 DIAGNOSIS — Z299 Encounter for prophylactic measures, unspecified: Secondary | ICD-10-CM | POA: Diagnosis not present

## 2019-01-31 DIAGNOSIS — N183 Chronic kidney disease, stage 3 unspecified: Secondary | ICD-10-CM | POA: Diagnosis not present

## 2019-01-31 DIAGNOSIS — Z6841 Body Mass Index (BMI) 40.0 and over, adult: Secondary | ICD-10-CM | POA: Diagnosis not present

## 2019-02-10 DIAGNOSIS — I10 Essential (primary) hypertension: Secondary | ICD-10-CM | POA: Diagnosis not present

## 2019-02-10 DIAGNOSIS — E7800 Pure hypercholesterolemia, unspecified: Secondary | ICD-10-CM | POA: Diagnosis not present

## 2019-02-10 DIAGNOSIS — E119 Type 2 diabetes mellitus without complications: Secondary | ICD-10-CM | POA: Diagnosis not present

## 2019-02-17 DIAGNOSIS — E559 Vitamin D deficiency, unspecified: Secondary | ICD-10-CM | POA: Diagnosis not present

## 2019-02-17 DIAGNOSIS — Z1211 Encounter for screening for malignant neoplasm of colon: Secondary | ICD-10-CM | POA: Diagnosis not present

## 2019-02-17 DIAGNOSIS — Z1331 Encounter for screening for depression: Secondary | ICD-10-CM | POA: Diagnosis not present

## 2019-02-17 DIAGNOSIS — E1165 Type 2 diabetes mellitus with hyperglycemia: Secondary | ICD-10-CM | POA: Diagnosis not present

## 2019-02-17 DIAGNOSIS — I10 Essential (primary) hypertension: Secondary | ICD-10-CM | POA: Diagnosis not present

## 2019-02-17 DIAGNOSIS — Z1339 Encounter for screening examination for other mental health and behavioral disorders: Secondary | ICD-10-CM | POA: Diagnosis not present

## 2019-02-17 DIAGNOSIS — Z299 Encounter for prophylactic measures, unspecified: Secondary | ICD-10-CM | POA: Diagnosis not present

## 2019-02-17 DIAGNOSIS — Z6841 Body Mass Index (BMI) 40.0 and over, adult: Secondary | ICD-10-CM | POA: Diagnosis not present

## 2019-02-17 DIAGNOSIS — Z0000 Encounter for general adult medical examination without abnormal findings: Secondary | ICD-10-CM | POA: Diagnosis not present

## 2019-02-17 DIAGNOSIS — R5383 Other fatigue: Secondary | ICD-10-CM | POA: Diagnosis not present

## 2019-02-17 DIAGNOSIS — Z7189 Other specified counseling: Secondary | ICD-10-CM | POA: Diagnosis not present

## 2019-02-17 DIAGNOSIS — E7800 Pure hypercholesterolemia, unspecified: Secondary | ICD-10-CM | POA: Diagnosis not present

## 2019-02-17 DIAGNOSIS — Z79899 Other long term (current) drug therapy: Secondary | ICD-10-CM | POA: Diagnosis not present

## 2019-02-18 DIAGNOSIS — E119 Type 2 diabetes mellitus without complications: Secondary | ICD-10-CM | POA: Diagnosis not present

## 2019-03-07 DIAGNOSIS — Z794 Long term (current) use of insulin: Secondary | ICD-10-CM | POA: Diagnosis not present

## 2019-03-07 DIAGNOSIS — Z79899 Other long term (current) drug therapy: Secondary | ICD-10-CM | POA: Diagnosis not present

## 2019-03-07 DIAGNOSIS — E119 Type 2 diabetes mellitus without complications: Secondary | ICD-10-CM | POA: Diagnosis not present

## 2019-03-07 DIAGNOSIS — K219 Gastro-esophageal reflux disease without esophagitis: Secondary | ICD-10-CM | POA: Diagnosis not present

## 2019-03-07 DIAGNOSIS — F419 Anxiety disorder, unspecified: Secondary | ICD-10-CM | POA: Diagnosis not present

## 2019-03-07 DIAGNOSIS — Z87891 Personal history of nicotine dependence: Secondary | ICD-10-CM | POA: Diagnosis not present

## 2019-03-07 DIAGNOSIS — I10 Essential (primary) hypertension: Secondary | ICD-10-CM | POA: Diagnosis not present

## 2019-03-10 DIAGNOSIS — Z299 Encounter for prophylactic measures, unspecified: Secondary | ICD-10-CM | POA: Diagnosis not present

## 2019-03-10 DIAGNOSIS — I129 Hypertensive chronic kidney disease with stage 1 through stage 4 chronic kidney disease, or unspecified chronic kidney disease: Secondary | ICD-10-CM | POA: Diagnosis not present

## 2019-03-10 DIAGNOSIS — D631 Anemia in chronic kidney disease: Secondary | ICD-10-CM | POA: Diagnosis not present

## 2019-03-10 DIAGNOSIS — E559 Vitamin D deficiency, unspecified: Secondary | ICD-10-CM | POA: Diagnosis not present

## 2019-03-10 DIAGNOSIS — N1832 Chronic kidney disease, stage 3b: Secondary | ICD-10-CM | POA: Diagnosis not present

## 2019-03-10 DIAGNOSIS — R809 Proteinuria, unspecified: Secondary | ICD-10-CM | POA: Diagnosis not present

## 2019-03-10 DIAGNOSIS — I10 Essential (primary) hypertension: Secondary | ICD-10-CM | POA: Diagnosis not present

## 2019-03-10 DIAGNOSIS — Z79899 Other long term (current) drug therapy: Secondary | ICD-10-CM | POA: Diagnosis not present

## 2019-03-10 DIAGNOSIS — F319 Bipolar disorder, unspecified: Secondary | ICD-10-CM | POA: Diagnosis not present

## 2019-03-10 DIAGNOSIS — Z6841 Body Mass Index (BMI) 40.0 and over, adult: Secondary | ICD-10-CM | POA: Diagnosis not present

## 2019-03-10 DIAGNOSIS — E1165 Type 2 diabetes mellitus with hyperglycemia: Secondary | ICD-10-CM | POA: Diagnosis not present

## 2019-03-23 DIAGNOSIS — D508 Other iron deficiency anemias: Secondary | ICD-10-CM | POA: Diagnosis not present

## 2019-03-23 DIAGNOSIS — I129 Hypertensive chronic kidney disease with stage 1 through stage 4 chronic kidney disease, or unspecified chronic kidney disease: Secondary | ICD-10-CM | POA: Diagnosis not present

## 2019-03-23 DIAGNOSIS — E872 Acidosis: Secondary | ICD-10-CM | POA: Diagnosis not present

## 2019-03-23 DIAGNOSIS — E6609 Other obesity due to excess calories: Secondary | ICD-10-CM | POA: Diagnosis not present

## 2019-03-23 DIAGNOSIS — N189 Chronic kidney disease, unspecified: Secondary | ICD-10-CM | POA: Diagnosis not present

## 2019-03-23 DIAGNOSIS — E1122 Type 2 diabetes mellitus with diabetic chronic kidney disease: Secondary | ICD-10-CM | POA: Diagnosis not present

## 2019-04-06 DIAGNOSIS — E119 Type 2 diabetes mellitus without complications: Secondary | ICD-10-CM | POA: Diagnosis not present

## 2019-04-20 DIAGNOSIS — E119 Type 2 diabetes mellitus without complications: Secondary | ICD-10-CM | POA: Diagnosis not present

## 2019-04-20 DIAGNOSIS — I10 Essential (primary) hypertension: Secondary | ICD-10-CM | POA: Diagnosis not present

## 2019-04-20 DIAGNOSIS — E7800 Pure hypercholesterolemia, unspecified: Secondary | ICD-10-CM | POA: Diagnosis not present

## 2019-04-25 DIAGNOSIS — Z87891 Personal history of nicotine dependence: Secondary | ICD-10-CM | POA: Diagnosis not present

## 2019-04-25 DIAGNOSIS — I10 Essential (primary) hypertension: Secondary | ICD-10-CM | POA: Diagnosis not present

## 2019-04-25 DIAGNOSIS — Z299 Encounter for prophylactic measures, unspecified: Secondary | ICD-10-CM | POA: Diagnosis not present

## 2019-04-25 DIAGNOSIS — Z6841 Body Mass Index (BMI) 40.0 and over, adult: Secondary | ICD-10-CM | POA: Diagnosis not present

## 2019-04-25 DIAGNOSIS — E1165 Type 2 diabetes mellitus with hyperglycemia: Secondary | ICD-10-CM | POA: Diagnosis not present

## 2019-04-25 DIAGNOSIS — E1142 Type 2 diabetes mellitus with diabetic polyneuropathy: Secondary | ICD-10-CM | POA: Diagnosis not present

## 2019-04-25 DIAGNOSIS — N183 Chronic kidney disease, stage 3 unspecified: Secondary | ICD-10-CM | POA: Diagnosis not present

## 2019-04-25 DIAGNOSIS — E1122 Type 2 diabetes mellitus with diabetic chronic kidney disease: Secondary | ICD-10-CM | POA: Diagnosis not present

## 2019-05-17 DIAGNOSIS — E119 Type 2 diabetes mellitus without complications: Secondary | ICD-10-CM | POA: Diagnosis not present

## 2019-05-18 DIAGNOSIS — I10 Essential (primary) hypertension: Secondary | ICD-10-CM | POA: Diagnosis not present

## 2019-05-18 DIAGNOSIS — E7800 Pure hypercholesterolemia, unspecified: Secondary | ICD-10-CM | POA: Diagnosis not present

## 2019-05-18 DIAGNOSIS — E119 Type 2 diabetes mellitus without complications: Secondary | ICD-10-CM | POA: Diagnosis not present

## 2019-06-19 DIAGNOSIS — E119 Type 2 diabetes mellitus without complications: Secondary | ICD-10-CM | POA: Diagnosis not present

## 2019-06-28 DIAGNOSIS — N189 Chronic kidney disease, unspecified: Secondary | ICD-10-CM | POA: Diagnosis not present

## 2019-06-28 DIAGNOSIS — E872 Acidosis: Secondary | ICD-10-CM | POA: Diagnosis not present

## 2019-06-28 DIAGNOSIS — E1122 Type 2 diabetes mellitus with diabetic chronic kidney disease: Secondary | ICD-10-CM | POA: Diagnosis not present

## 2019-06-28 DIAGNOSIS — I129 Hypertensive chronic kidney disease with stage 1 through stage 4 chronic kidney disease, or unspecified chronic kidney disease: Secondary | ICD-10-CM | POA: Diagnosis not present

## 2019-07-06 DIAGNOSIS — N189 Chronic kidney disease, unspecified: Secondary | ICD-10-CM | POA: Diagnosis not present

## 2019-07-06 DIAGNOSIS — E1122 Type 2 diabetes mellitus with diabetic chronic kidney disease: Secondary | ICD-10-CM | POA: Diagnosis not present

## 2019-07-06 DIAGNOSIS — E872 Acidosis: Secondary | ICD-10-CM | POA: Diagnosis not present

## 2019-07-06 DIAGNOSIS — I129 Hypertensive chronic kidney disease with stage 1 through stage 4 chronic kidney disease, or unspecified chronic kidney disease: Secondary | ICD-10-CM | POA: Diagnosis not present

## 2019-07-06 DIAGNOSIS — Z79899 Other long term (current) drug therapy: Secondary | ICD-10-CM | POA: Diagnosis not present

## 2019-07-06 DIAGNOSIS — E611 Iron deficiency: Secondary | ICD-10-CM | POA: Diagnosis not present

## 2019-07-06 DIAGNOSIS — E6609 Other obesity due to excess calories: Secondary | ICD-10-CM | POA: Diagnosis not present

## 2019-07-13 DIAGNOSIS — I10 Essential (primary) hypertension: Secondary | ICD-10-CM | POA: Diagnosis not present

## 2019-07-13 DIAGNOSIS — Z299 Encounter for prophylactic measures, unspecified: Secondary | ICD-10-CM | POA: Diagnosis not present

## 2019-07-13 DIAGNOSIS — N183 Chronic kidney disease, stage 3 unspecified: Secondary | ICD-10-CM | POA: Diagnosis not present

## 2019-07-13 DIAGNOSIS — E1165 Type 2 diabetes mellitus with hyperglycemia: Secondary | ICD-10-CM | POA: Diagnosis not present

## 2019-07-13 DIAGNOSIS — E1122 Type 2 diabetes mellitus with diabetic chronic kidney disease: Secondary | ICD-10-CM | POA: Diagnosis not present

## 2019-07-14 DIAGNOSIS — I10 Essential (primary) hypertension: Secondary | ICD-10-CM | POA: Diagnosis not present

## 2019-07-14 DIAGNOSIS — E119 Type 2 diabetes mellitus without complications: Secondary | ICD-10-CM | POA: Diagnosis not present

## 2019-07-14 DIAGNOSIS — E7800 Pure hypercholesterolemia, unspecified: Secondary | ICD-10-CM | POA: Diagnosis not present

## 2019-07-19 DIAGNOSIS — E119 Type 2 diabetes mellitus without complications: Secondary | ICD-10-CM | POA: Diagnosis not present

## 2019-08-19 DIAGNOSIS — E119 Type 2 diabetes mellitus without complications: Secondary | ICD-10-CM | POA: Diagnosis not present

## 2019-09-18 DIAGNOSIS — E119 Type 2 diabetes mellitus without complications: Secondary | ICD-10-CM | POA: Diagnosis not present

## 2019-10-19 ENCOUNTER — Emergency Department (HOSPITAL_COMMUNITY): Payer: Medicare Other

## 2019-10-19 ENCOUNTER — Encounter (HOSPITAL_COMMUNITY): Payer: Self-pay

## 2019-10-19 ENCOUNTER — Emergency Department (HOSPITAL_COMMUNITY)
Admission: EM | Admit: 2019-10-19 | Discharge: 2019-10-19 | Disposition: A | Discharge status: Home / Self Care | Payer: Medicare Other | Attending: Emergency Medicine | Admitting: Emergency Medicine

## 2019-10-19 ENCOUNTER — Other Ambulatory Visit: Payer: Self-pay

## 2019-10-19 DIAGNOSIS — I129 Hypertensive chronic kidney disease with stage 1 through stage 4 chronic kidney disease, or unspecified chronic kidney disease: Secondary | ICD-10-CM | POA: Insufficient documentation

## 2019-10-19 DIAGNOSIS — F419 Anxiety disorder, unspecified: Secondary | ICD-10-CM | POA: Insufficient documentation

## 2019-10-19 DIAGNOSIS — Z79899 Other long term (current) drug therapy: Secondary | ICD-10-CM | POA: Insufficient documentation

## 2019-10-19 DIAGNOSIS — Z87891 Personal history of nicotine dependence: Secondary | ICD-10-CM | POA: Diagnosis not present

## 2019-10-19 DIAGNOSIS — E7800 Pure hypercholesterolemia, unspecified: Secondary | ICD-10-CM | POA: Diagnosis not present

## 2019-10-19 DIAGNOSIS — E039 Hypothyroidism, unspecified: Secondary | ICD-10-CM | POA: Diagnosis not present

## 2019-10-19 DIAGNOSIS — I10 Essential (primary) hypertension: Secondary | ICD-10-CM | POA: Diagnosis not present

## 2019-10-19 DIAGNOSIS — R000 Tachycardia, unspecified: Secondary | ICD-10-CM | POA: Insufficient documentation

## 2019-10-19 DIAGNOSIS — E860 Dehydration: Secondary | ICD-10-CM | POA: Insufficient documentation

## 2019-10-19 DIAGNOSIS — N183 Chronic kidney disease, stage 3 unspecified: Secondary | ICD-10-CM | POA: Insufficient documentation

## 2019-10-19 DIAGNOSIS — Z7982 Long term (current) use of aspirin: Secondary | ICD-10-CM | POA: Diagnosis not present

## 2019-10-19 DIAGNOSIS — E669 Obesity, unspecified: Secondary | ICD-10-CM | POA: Diagnosis not present

## 2019-10-19 DIAGNOSIS — N170 Acute kidney failure with tubular necrosis: Secondary | ICD-10-CM | POA: Diagnosis not present

## 2019-10-19 DIAGNOSIS — E1142 Type 2 diabetes mellitus with diabetic polyneuropathy: Secondary | ICD-10-CM | POA: Diagnosis not present

## 2019-10-19 DIAGNOSIS — E119 Type 2 diabetes mellitus without complications: Secondary | ICD-10-CM | POA: Diagnosis not present

## 2019-10-19 LAB — TROPONIN I (HIGH SENSITIVITY)
Troponin I (High Sensitivity): 3 ng/L (ref <18)
Troponin I (High Sensitivity): 4 ng/L (ref <18)

## 2019-10-19 LAB — BASIC METABOLIC PANEL
Anion gap: 13 (ref 5–15)
BUN: 34 mg/dL — ABNORMAL HIGH (ref 8–23)
CO2: 23 mmol/L (ref 22–32)
Calcium: 9.3 mg/dL (ref 8.9–10.3)
Chloride: 98 mmol/L (ref 98–111)
Creatinine, Ser: 1.49 mg/dL — ABNORMAL HIGH (ref 0.44–1.00)
GFR calc Af Amer: 42 mL/min — ABNORMAL LOW (ref >60)
GFR calc non Af Amer: 36 mL/min — ABNORMAL LOW (ref >60)
Glucose, Bld: 399 mg/dL — ABNORMAL HIGH (ref 70–99)
Potassium: 4.4 mmol/L (ref 3.5–5.1)
Sodium: 134 mmol/L — ABNORMAL LOW (ref 135–145)

## 2019-10-19 LAB — CBC
HCT: 45.3 % (ref 36.0–46.0)
Hemoglobin: 13.9 g/dL (ref 12.0–15.0)
MCH: 27.2 pg (ref 26.0–34.0)
MCHC: 30.7 g/dL (ref 30.0–36.0)
MCV: 88.6 fL (ref 80.0–100.0)
Platelets: 180 10*3/uL (ref 150–400)
RBC: 5.11 MIL/uL (ref 3.87–5.11)
RDW: 16.5 % — ABNORMAL HIGH (ref 11.5–15.5)
WBC: 8.6 10*3/uL (ref 4.0–10.5)
nRBC: 0 % (ref 0.0–0.2)

## 2019-10-19 MED ORDER — METOPROLOL SUCCINATE ER 25 MG PO TB24
50.0000 mg | ORAL_TABLET | Freq: Every day | ORAL | Status: DC
  Administered 2019-10-19: 50 mg via ORAL
  Filled 2019-10-19: qty 2

## 2019-10-19 MED ORDER — ASPIRIN EC 81 MG PO TBEC
81.0000 mg | DELAYED_RELEASE_TABLET | Freq: Every day | ORAL | Status: DC
  Administered 2019-10-19: 81 mg via ORAL
  Filled 2019-10-19: qty 1

## 2019-10-19 MED ORDER — SODIUM CHLORIDE 0.9 % IV BOLUS
1000.0000 mL | Freq: Once | INTRAVENOUS | Status: AC
  Administered 2019-10-19: 1000 mL via INTRAVENOUS

## 2019-10-19 MED ORDER — SODIUM CHLORIDE 0.9% FLUSH: 3.0000 mL | Freq: Once | INTRAVENOUS | Status: DC

## 2019-10-19 NOTE — ED Triage Notes (Signed)
Pt sent to ED from [facility] for elevated creatinine, CBG greater than 350 and heart rate greater than 130. Pt denies chest pain.

## 2019-10-19 NOTE — Discharge Instructions (Addendum)
At this time there does not appear to be the presence of an emergent medical condition, however there is always the potential for conditions to change. Please read and follow the below instructions.  Please return to the Emergency Department immediately for any new or worsening symptoms. Please be sure to follow up with your Primary Care Provider within one week regarding your visit today; please call their office to schedule an appointment even if you are feeling better for a follow-up visit. Please be sure to drink plenty of water and get plenty of rest.  Please follow-up with your primary care doctor and your kidney specialist for rechecks.  Return to emergency department for any new or worsening symptoms.  Patient to take all of your home medications as prescribed. Stop taking your losartan until you are told to restart it by your nephrologist.  Get help right away if you: Have pain in your chest, upper arms, jaw, or neck. Become weak or dizzy. Feel faint. Have palpitations that do not go away. Any symptoms of very bad dehydration. Symptoms of vomiting, such as: You cannot eat or drink without vomiting. Your vomiting gets worse or does not go away. Your vomit has blood or green stuff in it. Symptoms that get worse with treatment. A fever. A very bad headache. Problems with peeing or pooping (having a bowel movement), such as: Watery poop that gets worse or does not go away. Blood in your poop (stool). This may cause poop to look black and tarry. Not peeing in 6-8 hours. Peeing only a small amount of very dark pee in 6-8 hours. Trouble breathing. These symptoms may be an emergency. Do not wait to see if the symptoms will go away. Get medical help right away. Call your local emergency services (911 in the [place] Do not drive yourself to the hospital.  Please read the additional information packets attached to your discharge summary.  Do not take your medicine if  develop an itchy  rash, swelling in your mouth or lips, or difficulty breathing; call 911 and seek immediate emergency medical attention if this occurs.  You may review your lab tests and imaging results in their entirety on your MyChart account.  Please discuss all results of fully with your primary care provider and other specialist at your follow-up visit.  Note: Portions of this text may have been transcribed using voice recognition software. Every effort was made to ensure accuracy; however, inadvertent computerized transcription errors may still be present.

## 2019-10-19 NOTE — ED Provider Notes (Signed)
[facility] Provider Note   CSN: [id number] Arrival date & time: 10/19/19  1129     History Chief Complaint  Patient presents with  . Tachycardia    Erika Jacobs is a 69 y.o. female history includes CKD, diabetes, bipolar, anxiety, hypertension, hypercholesterolemia.  Patient sent in today from nephrologist for concern of elevated creatinine, CBG and heart rate.  Patient reports that she is feeling well has no complaints she was at her kidney specialist today for a follow-up visit and was sent to the ER for evaluation of the above concerns.  Patient reports that she has been feeling well, no recent illnesses, no nausea/vomiting/diarrhea, chest pain, shortness of breath, extremity swelling/color change, dysuria/hematuria for concern of any kind.  She reports she is in her normal state of health.  Of note patient reports that she has not taken her daily medications. HPI     Past Medical History:  Diagnosis Date  . Anemia   . Anxiety   . Arthritis   . Bipolar 1 disorder ([place])   . Chronic constipation   . Chronic headache   . CKD (chronic kidney disease)   . Diabetes mellitus without complication ([place])   . Diabetic polyneuropathy ([place])   . Gastritis and duodenitis   . GERD (gastroesophageal reflux disease)   . Hypercholesteremia   . Hypertension   . Hypothyroid   . Overactive bladder   . Panic attack   . Paranoia ([place])   . Psychosis ([place])   . Shortness of breath   . Thought disorder     Patient Active Problem List   Diagnosis Date Noted  . Schizoaffective disorder, depressive type ([place]) 12/26/2016  . UTI (urinary tract infection) 10/17/2016  . CKD (chronic kidney disease), stage III 10/17/2016  . Acute renal failure (ARF) ([place]) 10/17/2016  . Hypotension 10/17/2016  . Tachycardia 10/17/2016  . Leukocytosis 10/17/2016  . Dyslipidemia 10/18/2014  . GERD (gastroesophageal reflux disease) 10/18/2014  . Hypothyroidism 10/18/2014  . Diabetes  ([place]) 10/17/2014  . Hypertension 10/17/2014  . Urinary incontinence 10/17/2014  . Cholecystitis with cholelithiasis 06/02/2013    Past Surgical History:  Procedure Laterality Date  . CHOLECYSTECTOMY N/A 06/02/2013   Procedure: LAPAROSCOPIC CHOLECYSTECTOMY;  Surgeon: Scherry Ran, MD;  Location: [facility];  Service: General;  Laterality: N/A;  . COLONOSCOPY WITH ESOPHAGOGASTRODUODENOSCOPY (EGD) N/A 05/20/2013   Procedure: COLONOSCOPY WITH ESOPHAGOGASTRODUODENOSCOPY (EGD);  Surgeon: Rogene Houston, MD;  Location: [facility];  Service: Endoscopy;  Laterality: N/A;  730  . DIALYSIS/PERMA CATHETER INSERTION N/A 10/17/2016   Procedure: Dialysis/Perma Catheter Insertion;  Surgeon: Katha Cabal, MD;  Location: [facility];  Service: Cardiovascular;  Laterality: N/A;  . EYE SURGERY Left   . TUBAL LIGATION       OB History    Gravida  4   Para      Term      Preterm      AB      Living  3     SAB      TAB      Ectopic      Multiple      Live Births              No family history on file.  Social History   Tobacco Use  . Smoking status: Former Smoker    Packs/day: 2.00    Years: 30.00    Pack years: 60.00    Types: Cigarettes  Start date: 10/18/1967    Quit date: 05/20/2002    Years since quitting: 17.4  . Smokeless tobacco: Never Used  Substance Use Topics  . Alcohol use: No  . Drug use: No    Home Medications Prior to Admission medications   Medication Sig Start Date End Date Taking? Authorizing Provider  amLODipine (NORVASC) 5 MG tablet Take 1 tablet (5 mg total) by mouth daily. 10/21/16  Yes Henreitta Leber, MD  aspirin EC 81 MG tablet Take 81 mg by mouth daily.   Yes [provider]  benztropine (COGENTIN) 1 MG tablet Take 1 tablet by mouth daily. 12/17/16  Yes [provider]  JARDIANCE 25 MG TABS tablet Take 25 mg by mouth daily. 10/01/19  Yes [provider]  metoprolol succinate (TOPROL-XL) 50 MG 24 hr  tablet Take 50 mg by mouth daily.  12/31/15  Yes [provider]  Multiple Vitamin (MULTIVITAMIN) tablet Take 1 tablet by mouth daily.   Yes [provider]  NOVOLOG FLEXPEN 100 UNIT/ML FlexPen Inject 10-15 Units into the skin See admin instructions. 10 units at morning and afternoon, then take 15 units in the evening 07/29/19  Yes [provider]  oxybutynin (DITROPAN) 5 MG tablet Take 5 mg by mouth 2 (two) times daily. 10/01/19  Yes [provider]  OZEMPIC, 1 MG/DOSE, 2 MG/1.5ML SOPN Inject 0.75 mLs into the skin every Sunday. 10/11/19  Yes [provider]  paliperidone (INVEGA) 6 MG 24 hr tablet Take 6 mg by mouth daily.  10/04/19  Yes [provider]  pantoprazole (PROTONIX) 40 MG tablet Take 40 mg by mouth daily. 10/03/19  Yes [provider]  pravastatin (PRAVACHOL) 40 MG tablet Take 40 mg by mouth daily.   Yes [provider]  TRESIBA FLEXTOUCH 200 UNIT/ML SOPN Inject 64 Units into the skin every morning.  05/25/15  Yes [provider]  venlafaxine XR (EFFEXOR-XR) 75 MG 24 hr capsule Take 1 capsule (75 mg total) by mouth daily with breakfast. 10/22/16  Yes Sainani, Belia Heman, MD    Allergies    Aripiprazole, Invega [paliperidone], Lisinopril, and Risperidone and related  Review of Systems   Review of Systems Ten systems are reviewed and are negative for acute change except as noted in the HPI  Physical Exam Updated Vital Signs BP (!) 144/64   Pulse 94   Temp 98.3 F (36.8 C) (Oral)   Resp 19   Ht 5' (1.524 m)   Wt 107.5 kg   LMP  (LMP Unknown)   SpO2 98%   BMI 46.29 kg/m   Physical Exam Constitutional:      General: She is not in acute distress.    Appearance: Normal appearance. She is well-developed. She is not ill-appearing or diaphoretic.  HENT:     Head: Normocephalic and atraumatic.  Eyes:     General: Vision grossly intact. Gaze aligned appropriately.     Pupils: Pupils are equal, round, and  reactive to light.  Neck:     Trachea: Trachea and phonation normal.  Cardiovascular:     Rate and Rhythm: Regular rhythm. Tachycardia present.     Pulses: Normal pulses.  Pulmonary:     Effort: Pulmonary effort is normal. No respiratory distress.     Breath sounds: Normal breath sounds.  Abdominal:     General: There is no distension.     Palpations: Abdomen is soft.     Tenderness: There is no abdominal tenderness. There is no guarding  or rebound.  Musculoskeletal:        General: Normal range of motion.     Cervical back: Normal range of motion.     Right lower leg: No edema.     Left lower leg: No edema.  Skin:    General: Skin is warm and dry.  Neurological:     Mental Status: She is alert.     GCS: GCS eye subscore is 4. GCS verbal subscore is 5. GCS motor subscore is 6.     Comments: Speech is clear and goal oriented, follows commands Major Cranial nerves without deficit, no facial droop Moves extremities without ataxia, coordination intact  Psychiatric:        Behavior: Behavior normal.     ED Results / Procedures / Treatments   Labs (all labs ordered are listed, but only abnormal results are displayed) Labs Reviewed  BASIC METABOLIC PANEL - Abnormal; Notable for the following components:      Result Value   Sodium 134 (*)    Glucose, Bld 399 (*)    BUN 34 (*)    Creatinine, Ser 1.49 (*)    GFR calc non Af Amer 36 (*)    GFR calc Af Amer 42 (*)    All other components within normal limits  CBC - Abnormal; Notable for the following components:   RDW 16.5 (*)    All other components within normal limits  URINALYSIS, ROUTINE W REFLEX MICROSCOPIC  TROPONIN I (HIGH SENSITIVITY)  TROPONIN I (HIGH SENSITIVITY)    EKG EKG Interpretation  Date/Time:  Wednesday October 19 2019 12:15:22 EDT Ventricular Rate:  124 PR Interval:  158 QRS Duration: 76 QT Interval:  306 QTC Calculation: 439 R Axis:   86 Text Interpretation: Sinus tachycardia Possible Anterior  infarct , age undetermined Abnormal ECG Since last tracing rate faster Confirmed by Isla Pence [phone]) on 10/19/2019 3:46:19 PM   Radiology DG Chest 2 View  Result Date: 10/19/2019 CLINICAL DATA:  Tachycardia. EXAM: CHEST - 2 VIEW COMPARISON:  Prior chest radiograph 11/16/2015 and earlier FINDINGS: Heart size within normal limits. There is no appreciable airspace consolidation. No evidence of pleural effusion or pneumothorax. No acute bony abnormality identified.  Thoracic spondylosis. IMPRESSION: No evidence of acute cardiopulmonary abnormality. Electronically Signed   By: Kellie Simmering DO   On: 10/19/2019 12:37    Procedures Procedures (including critical care time)  Medications Ordered in ED Medications  sodium chloride flush (NS) 0.9 % injection 3 mL (3 mLs Intravenous Not Given 10/19/19 1622)  metoprolol succinate (TOPROL-XL) 24 hr tablet 50 mg (50 mg Oral Given 10/19/19 1715)  aspirin EC tablet 81 mg (81 mg Oral Given 10/19/19 1715)  sodium chloride 0.9 % bolus 1,000 mL (0 mLs Intravenous Stopped 10/19/19 1821)    ED Course  I have reviewed the triage vital signs and the nursing notes.  Pertinent labs & imaging results that were available during my care of the patient were reviewed by me and considered in my medical decision making (see chart for details).    MDM Rules/Calculators/A&P                          Additional History Obtained: 1. Nursing notes from this visit. 2. EMR system reviewed, patient was seen by nephrology today.  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  It appears that her creatinine  was 2.1 in the office, increased from baseline of 1.4.  CBG noted to be greater than 350.  Pulse was around 120 bpm.  It appears they plan to have patient follow-up in 2 weeks for recheck of Chem-7 and to hold losartan.  She was sent to the ED for EKG and work-up regarding  tachycardia. ------------------------ I ordered, reviewed and interpreted labs which include: CBC shows no leukocytosis suggest infection and no evidence of anemia. 2 high-sensitivity troponins were obtained and are both within normal limits. Creatinine of 1.49 appears near baseline, improved.  BUN 34, patient without GI bleed symptoms.  Glucose 399.  No emergent electrolyte derangement or gap.  No evidence of DKA. Chest x-ray:  IMPRESSION:  No evidence of acute cardiopulmonary abnormality.  I have personally reviewed patient's chest x-ray and agree with radiologist interpretation.  EKG: Sinus tachycardia Possible Anterior infarct , age undetermined Abnormal ECG Since last tracing rate faster Confirmed by Isla Pence [phone]) on 10/19/2019 3:46:19 PM - Patient had not taken her home medications today, she was given her home dose of metoprolol, aspirin and Protonix.  She was given a 1 L fluid bolus.  Tachycardia subsequently resolved.  Suspect tachycardia may have been due to some element of dehydration and not taking her metoprolol today.  Doubt ACS, PE, dissection, anemia, pneumonia, DKA, infection or other emergent pathologies requiring further ED work-up.  She remains asymptomatic on reevaluation she is requesting discharge.  Given resolution of tachycardia and reassuring work-up above do not feel additional work-up in the ED is indicated.  A urinalysis was ordered for the patient however she denies any urinary symptoms and requests to leave, urinalysis was canceled.  Patient informed to stop taking losartan until told otherwise by nephrology.  Patient is ambulatory at discharge without assistance or difficulty.   At this time there does not appear to be any evidence of an acute emergency medical condition and the patient appears stable for discharge with appropriate outpatient follow up. Diagnosis was discussed with patient who verbalizes understanding of care plan and is agreeable to  discharge. I have discussed return precautions with patient who verbalizes understanding. Patient encouraged to follow-up with their PCP . All questions answered.  Patient was seen and evaluated by Dr. Gilford Raid during this visit.  Note: Portions of this report may have been transcribed using voice recognition software. Every effort was made to ensure accuracy; however, inadvertent computerized transcription errors may still be present. Final Clinical Impression(s) / ED Diagnoses Final diagnoses:  Tachycardia  Dehydration    Rx / DC Orders ED Discharge Orders    None       Gari Crown 10/19/19 1832    Isla Pence, MD 10/19/19 1901

## 2019-11-04 DIAGNOSIS — E1122 Type 2 diabetes mellitus with diabetic chronic kidney disease: Secondary | ICD-10-CM | POA: Diagnosis not present

## 2019-11-04 DIAGNOSIS — N170 Acute kidney failure with tubular necrosis: Secondary | ICD-10-CM | POA: Diagnosis not present

## 2019-11-04 DIAGNOSIS — I129 Hypertensive chronic kidney disease with stage 1 through stage 4 chronic kidney disease, or unspecified chronic kidney disease: Secondary | ICD-10-CM | POA: Diagnosis not present

## 2019-11-04 DIAGNOSIS — N189 Chronic kidney disease, unspecified: Secondary | ICD-10-CM | POA: Diagnosis not present

## 2019-11-04 DIAGNOSIS — E871 Hypo-osmolality and hyponatremia: Secondary | ICD-10-CM | POA: Diagnosis not present

## 2019-11-15 DIAGNOSIS — N170 Acute kidney failure with tubular necrosis: Secondary | ICD-10-CM | POA: Diagnosis not present

## 2019-11-18 DIAGNOSIS — I10 Essential (primary) hypertension: Secondary | ICD-10-CM | POA: Diagnosis not present

## 2019-11-18 DIAGNOSIS — E119 Type 2 diabetes mellitus without complications: Secondary | ICD-10-CM | POA: Diagnosis not present

## 2019-11-18 DIAGNOSIS — E871 Hypo-osmolality and hyponatremia: Secondary | ICD-10-CM | POA: Diagnosis not present

## 2019-11-18 DIAGNOSIS — I129 Hypertensive chronic kidney disease with stage 1 through stage 4 chronic kidney disease, or unspecified chronic kidney disease: Secondary | ICD-10-CM | POA: Diagnosis not present

## 2019-11-18 DIAGNOSIS — E6609 Other obesity due to excess calories: Secondary | ICD-10-CM | POA: Diagnosis not present

## 2019-11-18 DIAGNOSIS — E7800 Pure hypercholesterolemia, unspecified: Secondary | ICD-10-CM | POA: Diagnosis not present

## 2019-11-18 DIAGNOSIS — E1122 Type 2 diabetes mellitus with diabetic chronic kidney disease: Secondary | ICD-10-CM | POA: Diagnosis not present

## 2019-11-18 DIAGNOSIS — N189 Chronic kidney disease, unspecified: Secondary | ICD-10-CM | POA: Diagnosis not present

## 2019-11-21 DIAGNOSIS — E7800 Pure hypercholesterolemia, unspecified: Secondary | ICD-10-CM | POA: Diagnosis not present

## 2019-11-21 DIAGNOSIS — I10 Essential (primary) hypertension: Secondary | ICD-10-CM | POA: Diagnosis not present

## 2019-11-21 DIAGNOSIS — E119 Type 2 diabetes mellitus without complications: Secondary | ICD-10-CM | POA: Diagnosis not present

## 2019-11-23 DIAGNOSIS — F29 Unspecified psychosis not due to a substance or known physiological condition: Secondary | ICD-10-CM | POA: Diagnosis not present

## 2019-12-20 DIAGNOSIS — E119 Type 2 diabetes mellitus without complications: Secondary | ICD-10-CM | POA: Diagnosis not present

## 2020-01-19 DIAGNOSIS — E7800 Pure hypercholesterolemia, unspecified: Secondary | ICD-10-CM | POA: Diagnosis not present

## 2020-01-19 DIAGNOSIS — I10 Essential (primary) hypertension: Secondary | ICD-10-CM | POA: Diagnosis not present

## 2020-01-19 DIAGNOSIS — E119 Type 2 diabetes mellitus without complications: Secondary | ICD-10-CM | POA: Diagnosis not present

## 2020-01-20 DIAGNOSIS — E1142 Type 2 diabetes mellitus with diabetic polyneuropathy: Secondary | ICD-10-CM | POA: Diagnosis not present

## 2020-01-20 DIAGNOSIS — Z299 Encounter for prophylactic measures, unspecified: Secondary | ICD-10-CM | POA: Diagnosis not present

## 2020-01-20 DIAGNOSIS — E1122 Type 2 diabetes mellitus with diabetic chronic kidney disease: Secondary | ICD-10-CM | POA: Diagnosis not present

## 2020-01-20 DIAGNOSIS — I10 Essential (primary) hypertension: Secondary | ICD-10-CM | POA: Diagnosis not present

## 2020-01-20 DIAGNOSIS — E1165 Type 2 diabetes mellitus with hyperglycemia: Secondary | ICD-10-CM | POA: Diagnosis not present

## 2020-01-20 DIAGNOSIS — Z6841 Body Mass Index (BMI) 40.0 and over, adult: Secondary | ICD-10-CM | POA: Diagnosis not present

## 2020-02-17 DIAGNOSIS — E119 Type 2 diabetes mellitus without complications: Secondary | ICD-10-CM | POA: Diagnosis not present

## 2020-02-17 DIAGNOSIS — I10 Essential (primary) hypertension: Secondary | ICD-10-CM | POA: Diagnosis not present

## 2020-02-17 DIAGNOSIS — E7800 Pure hypercholesterolemia, unspecified: Secondary | ICD-10-CM | POA: Diagnosis not present

## 2020-02-18 DIAGNOSIS — E119 Type 2 diabetes mellitus without complications: Secondary | ICD-10-CM | POA: Diagnosis not present

## 2020-02-20 DIAGNOSIS — Z1331 Encounter for screening for depression: Secondary | ICD-10-CM | POA: Diagnosis not present

## 2020-02-20 DIAGNOSIS — Z0000 Encounter for general adult medical examination without abnormal findings: Secondary | ICD-10-CM | POA: Diagnosis not present

## 2020-02-20 DIAGNOSIS — Z1339 Encounter for screening examination for other mental health and behavioral disorders: Secondary | ICD-10-CM | POA: Diagnosis not present

## 2020-02-20 DIAGNOSIS — E871 Hypo-osmolality and hyponatremia: Secondary | ICD-10-CM | POA: Diagnosis not present

## 2020-02-20 DIAGNOSIS — Z299 Encounter for prophylactic measures, unspecified: Secondary | ICD-10-CM | POA: Diagnosis not present

## 2020-02-20 DIAGNOSIS — R5383 Other fatigue: Secondary | ICD-10-CM | POA: Diagnosis not present

## 2020-02-20 DIAGNOSIS — E6609 Other obesity due to excess calories: Secondary | ICD-10-CM | POA: Diagnosis not present

## 2020-02-20 DIAGNOSIS — N189 Chronic kidney disease, unspecified: Secondary | ICD-10-CM | POA: Diagnosis not present

## 2020-02-20 DIAGNOSIS — Z7189 Other specified counseling: Secondary | ICD-10-CM | POA: Diagnosis not present

## 2020-02-20 DIAGNOSIS — E7800 Pure hypercholesterolemia, unspecified: Secondary | ICD-10-CM | POA: Diagnosis not present

## 2020-02-20 DIAGNOSIS — Z79899 Other long term (current) drug therapy: Secondary | ICD-10-CM | POA: Diagnosis not present

## 2020-02-20 DIAGNOSIS — Z6841 Body Mass Index (BMI) 40.0 and over, adult: Secondary | ICD-10-CM | POA: Diagnosis not present

## 2020-02-20 DIAGNOSIS — E559 Vitamin D deficiency, unspecified: Secondary | ICD-10-CM | POA: Diagnosis not present

## 2020-02-20 DIAGNOSIS — I129 Hypertensive chronic kidney disease with stage 1 through stage 4 chronic kidney disease, or unspecified chronic kidney disease: Secondary | ICD-10-CM | POA: Diagnosis not present

## 2020-02-24 DIAGNOSIS — E1122 Type 2 diabetes mellitus with diabetic chronic kidney disease: Secondary | ICD-10-CM | POA: Diagnosis not present

## 2020-02-24 DIAGNOSIS — E611 Iron deficiency: Secondary | ICD-10-CM | POA: Diagnosis not present

## 2020-02-24 DIAGNOSIS — I129 Hypertensive chronic kidney disease with stage 1 through stage 4 chronic kidney disease, or unspecified chronic kidney disease: Secondary | ICD-10-CM | POA: Diagnosis not present

## 2020-02-24 DIAGNOSIS — N189 Chronic kidney disease, unspecified: Secondary | ICD-10-CM | POA: Diagnosis not present

## 2020-02-24 DIAGNOSIS — E871 Hypo-osmolality and hyponatremia: Secondary | ICD-10-CM | POA: Diagnosis not present

## 2020-02-24 DIAGNOSIS — E6609 Other obesity due to excess calories: Secondary | ICD-10-CM | POA: Diagnosis not present

## 2020-03-12 DIAGNOSIS — Z23 Encounter for immunization: Secondary | ICD-10-CM | POA: Diagnosis not present

## 2020-03-13 DIAGNOSIS — E2839 Other primary ovarian failure: Secondary | ICD-10-CM | POA: Diagnosis not present

## 2020-03-20 DIAGNOSIS — E7800 Pure hypercholesterolemia, unspecified: Secondary | ICD-10-CM | POA: Diagnosis not present

## 2020-03-20 DIAGNOSIS — E119 Type 2 diabetes mellitus without complications: Secondary | ICD-10-CM | POA: Diagnosis not present

## 2020-03-20 DIAGNOSIS — I10 Essential (primary) hypertension: Secondary | ICD-10-CM | POA: Diagnosis not present

## 2020-04-19 DIAGNOSIS — E119 Type 2 diabetes mellitus without complications: Secondary | ICD-10-CM | POA: Diagnosis not present

## 2020-04-30 DIAGNOSIS — N189 Chronic kidney disease, unspecified: Secondary | ICD-10-CM | POA: Diagnosis not present

## 2020-04-30 DIAGNOSIS — D631 Anemia in chronic kidney disease: Secondary | ICD-10-CM | POA: Diagnosis not present

## 2020-05-03 DIAGNOSIS — I129 Hypertensive chronic kidney disease with stage 1 through stage 4 chronic kidney disease, or unspecified chronic kidney disease: Secondary | ICD-10-CM | POA: Diagnosis not present

## 2020-05-04 DIAGNOSIS — F29 Unspecified psychosis not due to a substance or known physiological condition: Secondary | ICD-10-CM | POA: Diagnosis not present

## 2020-05-12 DIAGNOSIS — R6883 Chills (without fever): Secondary | ICD-10-CM | POA: Diagnosis not present

## 2020-05-12 DIAGNOSIS — E860 Dehydration: Secondary | ICD-10-CM | POA: Diagnosis not present

## 2020-05-12 DIAGNOSIS — R9431 Abnormal electrocardiogram [ECG] [EKG]: Secondary | ICD-10-CM | POA: Diagnosis not present

## 2020-05-12 DIAGNOSIS — E1122 Type 2 diabetes mellitus with diabetic chronic kidney disease: Secondary | ICD-10-CM | POA: Diagnosis not present

## 2020-05-12 DIAGNOSIS — N181 Chronic kidney disease, stage 1: Secondary | ICD-10-CM | POA: Diagnosis not present

## 2020-05-12 DIAGNOSIS — R519 Headache, unspecified: Secondary | ICD-10-CM | POA: Diagnosis not present

## 2020-05-12 DIAGNOSIS — R059 Cough, unspecified: Secondary | ICD-10-CM | POA: Diagnosis not present

## 2020-05-12 DIAGNOSIS — Z20822 Contact with and (suspected) exposure to covid-19: Secondary | ICD-10-CM | POA: Diagnosis not present

## 2020-05-12 DIAGNOSIS — R42 Dizziness and giddiness: Secondary | ICD-10-CM | POA: Diagnosis not present

## 2020-05-14 DIAGNOSIS — R531 Weakness: Secondary | ICD-10-CM | POA: Diagnosis not present

## 2020-05-14 DIAGNOSIS — I10 Essential (primary) hypertension: Secondary | ICD-10-CM | POA: Diagnosis not present

## 2020-05-14 DIAGNOSIS — Z888 Allergy status to other drugs, medicaments and biological substances status: Secondary | ICD-10-CM | POA: Diagnosis not present

## 2020-05-14 DIAGNOSIS — E119 Type 2 diabetes mellitus without complications: Secondary | ICD-10-CM | POA: Diagnosis not present

## 2020-05-14 DIAGNOSIS — E860 Dehydration: Secondary | ICD-10-CM | POA: Diagnosis not present

## 2020-05-15 DIAGNOSIS — F319 Bipolar disorder, unspecified: Secondary | ICD-10-CM | POA: Diagnosis not present

## 2020-05-15 DIAGNOSIS — I10 Essential (primary) hypertension: Secondary | ICD-10-CM | POA: Diagnosis not present

## 2020-05-15 DIAGNOSIS — Z6841 Body Mass Index (BMI) 40.0 and over, adult: Secondary | ICD-10-CM | POA: Diagnosis not present

## 2020-05-15 DIAGNOSIS — Z299 Encounter for prophylactic measures, unspecified: Secondary | ICD-10-CM | POA: Diagnosis not present

## 2020-05-15 DIAGNOSIS — E1165 Type 2 diabetes mellitus with hyperglycemia: Secondary | ICD-10-CM | POA: Diagnosis not present

## 2020-05-15 DIAGNOSIS — Z789 Other specified health status: Secondary | ICD-10-CM | POA: Diagnosis not present

## 2020-05-21 DIAGNOSIS — E119 Type 2 diabetes mellitus without complications: Secondary | ICD-10-CM | POA: Diagnosis not present

## 2020-05-23 DIAGNOSIS — R350 Frequency of micturition: Secondary | ICD-10-CM | POA: Diagnosis not present

## 2020-05-23 DIAGNOSIS — E1122 Type 2 diabetes mellitus with diabetic chronic kidney disease: Secondary | ICD-10-CM | POA: Diagnosis not present

## 2020-05-23 DIAGNOSIS — Z789 Other specified health status: Secondary | ICD-10-CM | POA: Diagnosis not present

## 2020-05-23 DIAGNOSIS — I10 Essential (primary) hypertension: Secondary | ICD-10-CM | POA: Diagnosis not present

## 2020-05-23 DIAGNOSIS — Z299 Encounter for prophylactic measures, unspecified: Secondary | ICD-10-CM | POA: Diagnosis not present

## 2020-05-23 DIAGNOSIS — E1165 Type 2 diabetes mellitus with hyperglycemia: Secondary | ICD-10-CM | POA: Diagnosis not present

## 2020-05-23 DIAGNOSIS — Z6841 Body Mass Index (BMI) 40.0 and over, adult: Secondary | ICD-10-CM | POA: Diagnosis not present

## 2020-05-29 DIAGNOSIS — E1165 Type 2 diabetes mellitus with hyperglycemia: Secondary | ICD-10-CM | POA: Diagnosis not present

## 2020-05-29 DIAGNOSIS — Z299 Encounter for prophylactic measures, unspecified: Secondary | ICD-10-CM | POA: Diagnosis not present

## 2020-05-29 DIAGNOSIS — Z789 Other specified health status: Secondary | ICD-10-CM | POA: Diagnosis not present

## 2020-05-29 DIAGNOSIS — N183 Chronic kidney disease, stage 3 unspecified: Secondary | ICD-10-CM | POA: Diagnosis not present

## 2020-05-29 DIAGNOSIS — Z6841 Body Mass Index (BMI) 40.0 and over, adult: Secondary | ICD-10-CM | POA: Diagnosis not present

## 2020-05-29 DIAGNOSIS — I10 Essential (primary) hypertension: Secondary | ICD-10-CM | POA: Diagnosis not present

## 2020-06-18 DIAGNOSIS — E1142 Type 2 diabetes mellitus with diabetic polyneuropathy: Secondary | ICD-10-CM | POA: Diagnosis not present

## 2020-06-18 DIAGNOSIS — E1165 Type 2 diabetes mellitus with hyperglycemia: Secondary | ICD-10-CM | POA: Diagnosis not present

## 2020-06-18 DIAGNOSIS — Z6841 Body Mass Index (BMI) 40.0 and over, adult: Secondary | ICD-10-CM | POA: Diagnosis not present

## 2020-06-18 DIAGNOSIS — Z299 Encounter for prophylactic measures, unspecified: Secondary | ICD-10-CM | POA: Diagnosis not present

## 2020-06-18 DIAGNOSIS — E119 Type 2 diabetes mellitus without complications: Secondary | ICD-10-CM | POA: Diagnosis not present

## 2020-06-18 DIAGNOSIS — E1122 Type 2 diabetes mellitus with diabetic chronic kidney disease: Secondary | ICD-10-CM | POA: Diagnosis not present

## 2020-06-18 DIAGNOSIS — I10 Essential (primary) hypertension: Secondary | ICD-10-CM | POA: Diagnosis not present

## 2020-07-19 DIAGNOSIS — E119 Type 2 diabetes mellitus without complications: Secondary | ICD-10-CM | POA: Diagnosis not present

## 2020-07-27 DIAGNOSIS — N183 Chronic kidney disease, stage 3 unspecified: Secondary | ICD-10-CM | POA: Diagnosis not present

## 2020-07-27 DIAGNOSIS — H52221 Regular astigmatism, right eye: Secondary | ICD-10-CM | POA: Diagnosis not present

## 2020-07-27 DIAGNOSIS — H524 Presbyopia: Secondary | ICD-10-CM | POA: Diagnosis not present

## 2020-07-27 DIAGNOSIS — E1122 Type 2 diabetes mellitus with diabetic chronic kidney disease: Secondary | ICD-10-CM | POA: Diagnosis not present

## 2020-07-27 DIAGNOSIS — E311 Polyglandular hyperfunction: Secondary | ICD-10-CM | POA: Diagnosis not present

## 2020-07-27 DIAGNOSIS — Z961 Presence of intraocular lens: Secondary | ICD-10-CM | POA: Diagnosis not present

## 2020-07-27 DIAGNOSIS — E119 Type 2 diabetes mellitus without complications: Secondary | ICD-10-CM | POA: Diagnosis not present

## 2020-07-27 DIAGNOSIS — Z794 Long term (current) use of insulin: Secondary | ICD-10-CM | POA: Diagnosis not present

## 2020-07-27 DIAGNOSIS — I129 Hypertensive chronic kidney disease with stage 1 through stage 4 chronic kidney disease, or unspecified chronic kidney disease: Secondary | ICD-10-CM | POA: Diagnosis not present

## 2020-07-27 DIAGNOSIS — Z7984 Long term (current) use of oral hypoglycemic drugs: Secondary | ICD-10-CM | POA: Diagnosis not present

## 2020-07-27 DIAGNOSIS — N186 End stage renal disease: Secondary | ICD-10-CM | POA: Diagnosis not present

## 2020-07-27 DIAGNOSIS — H25811 Combined forms of age-related cataract, right eye: Secondary | ICD-10-CM | POA: Diagnosis not present

## 2020-07-27 DIAGNOSIS — E3611 Accidental puncture and laceration of an endocrine system organ or structure during an endocrine system procedure: Secondary | ICD-10-CM | POA: Diagnosis not present

## 2020-07-27 DIAGNOSIS — H5201 Hypermetropia, right eye: Secondary | ICD-10-CM | POA: Diagnosis not present

## 2020-08-03 DIAGNOSIS — Z6841 Body Mass Index (BMI) 40.0 and over, adult: Secondary | ICD-10-CM | POA: Diagnosis not present

## 2020-08-03 DIAGNOSIS — E1122 Type 2 diabetes mellitus with diabetic chronic kidney disease: Secondary | ICD-10-CM | POA: Diagnosis not present

## 2020-08-03 DIAGNOSIS — N189 Chronic kidney disease, unspecified: Secondary | ICD-10-CM | POA: Diagnosis not present

## 2020-08-03 DIAGNOSIS — I129 Hypertensive chronic kidney disease with stage 1 through stage 4 chronic kidney disease, or unspecified chronic kidney disease: Secondary | ICD-10-CM | POA: Diagnosis not present

## 2020-08-31 DIAGNOSIS — F319 Bipolar disorder, unspecified: Secondary | ICD-10-CM | POA: Diagnosis not present

## 2020-08-31 DIAGNOSIS — E1122 Type 2 diabetes mellitus with diabetic chronic kidney disease: Secondary | ICD-10-CM | POA: Diagnosis not present

## 2020-08-31 DIAGNOSIS — Z299 Encounter for prophylactic measures, unspecified: Secondary | ICD-10-CM | POA: Diagnosis not present

## 2020-08-31 DIAGNOSIS — I10 Essential (primary) hypertension: Secondary | ICD-10-CM | POA: Diagnosis not present

## 2020-08-31 DIAGNOSIS — Z6841 Body Mass Index (BMI) 40.0 and over, adult: Secondary | ICD-10-CM | POA: Diagnosis not present

## 2020-08-31 DIAGNOSIS — R030 Elevated blood-pressure reading, without diagnosis of hypertension: Secondary | ICD-10-CM | POA: Diagnosis not present

## 2020-08-31 DIAGNOSIS — Z720 Tobacco use: Secondary | ICD-10-CM | POA: Diagnosis not present

## 2020-08-31 DIAGNOSIS — E1165 Type 2 diabetes mellitus with hyperglycemia: Secondary | ICD-10-CM | POA: Diagnosis not present

## 2020-10-23 DIAGNOSIS — N189 Chronic kidney disease, unspecified: Secondary | ICD-10-CM | POA: Diagnosis not present

## 2020-11-02 DIAGNOSIS — E1122 Type 2 diabetes mellitus with diabetic chronic kidney disease: Secondary | ICD-10-CM | POA: Diagnosis not present

## 2020-11-02 DIAGNOSIS — I129 Hypertensive chronic kidney disease with stage 1 through stage 4 chronic kidney disease, or unspecified chronic kidney disease: Secondary | ICD-10-CM | POA: Diagnosis not present

## 2020-11-02 DIAGNOSIS — N189 Chronic kidney disease, unspecified: Secondary | ICD-10-CM | POA: Diagnosis not present

## 2020-11-02 DIAGNOSIS — Z6841 Body Mass Index (BMI) 40.0 and over, adult: Secondary | ICD-10-CM | POA: Diagnosis not present

## 2020-12-03 DIAGNOSIS — Z299 Encounter for prophylactic measures, unspecified: Secondary | ICD-10-CM | POA: Diagnosis not present

## 2020-12-03 DIAGNOSIS — Z6841 Body Mass Index (BMI) 40.0 and over, adult: Secondary | ICD-10-CM | POA: Diagnosis not present

## 2020-12-03 DIAGNOSIS — E1165 Type 2 diabetes mellitus with hyperglycemia: Secondary | ICD-10-CM | POA: Diagnosis not present

## 2020-12-03 DIAGNOSIS — R030 Elevated blood-pressure reading, without diagnosis of hypertension: Secondary | ICD-10-CM | POA: Diagnosis not present

## 2020-12-03 DIAGNOSIS — N183 Chronic kidney disease, stage 3 unspecified: Secondary | ICD-10-CM | POA: Diagnosis not present

## 2020-12-03 DIAGNOSIS — Z720 Tobacco use: Secondary | ICD-10-CM | POA: Diagnosis not present

## 2020-12-03 DIAGNOSIS — I10 Essential (primary) hypertension: Secondary | ICD-10-CM | POA: Diagnosis not present

## 2020-12-03 DIAGNOSIS — E1122 Type 2 diabetes mellitus with diabetic chronic kidney disease: Secondary | ICD-10-CM | POA: Diagnosis not present

## 2021-02-01 DIAGNOSIS — E1122 Type 2 diabetes mellitus with diabetic chronic kidney disease: Secondary | ICD-10-CM | POA: Diagnosis not present

## 2021-02-01 DIAGNOSIS — Z6841 Body Mass Index (BMI) 40.0 and over, adult: Secondary | ICD-10-CM | POA: Diagnosis not present

## 2021-02-01 DIAGNOSIS — N186 End stage renal disease: Secondary | ICD-10-CM | POA: Diagnosis not present

## 2021-02-01 DIAGNOSIS — N189 Chronic kidney disease, unspecified: Secondary | ICD-10-CM | POA: Diagnosis not present

## 2021-02-07 DIAGNOSIS — N189 Chronic kidney disease, unspecified: Secondary | ICD-10-CM | POA: Diagnosis not present

## 2021-02-07 DIAGNOSIS — Z6841 Body Mass Index (BMI) 40.0 and over, adult: Secondary | ICD-10-CM | POA: Diagnosis not present

## 2021-02-07 DIAGNOSIS — E1129 Type 2 diabetes mellitus with other diabetic kidney complication: Secondary | ICD-10-CM | POA: Diagnosis not present

## 2021-02-07 DIAGNOSIS — I129 Hypertensive chronic kidney disease with stage 1 through stage 4 chronic kidney disease, or unspecified chronic kidney disease: Secondary | ICD-10-CM | POA: Diagnosis not present

## 2021-02-07 DIAGNOSIS — E1122 Type 2 diabetes mellitus with diabetic chronic kidney disease: Secondary | ICD-10-CM | POA: Diagnosis not present

## 2021-02-07 DIAGNOSIS — R809 Proteinuria, unspecified: Secondary | ICD-10-CM | POA: Diagnosis not present

## 2021-02-20 DIAGNOSIS — Z6841 Body Mass Index (BMI) 40.0 and over, adult: Secondary | ICD-10-CM | POA: Diagnosis not present

## 2021-02-20 DIAGNOSIS — I10 Essential (primary) hypertension: Secondary | ICD-10-CM | POA: Diagnosis not present

## 2021-02-20 DIAGNOSIS — Z299 Encounter for prophylactic measures, unspecified: Secondary | ICD-10-CM | POA: Diagnosis not present

## 2021-02-20 DIAGNOSIS — Z7189 Other specified counseling: Secondary | ICD-10-CM | POA: Diagnosis not present

## 2021-02-20 DIAGNOSIS — Z23 Encounter for immunization: Secondary | ICD-10-CM | POA: Diagnosis not present

## 2021-02-20 DIAGNOSIS — Z0000 Encounter for general adult medical examination without abnormal findings: Secondary | ICD-10-CM | POA: Diagnosis not present

## 2021-02-20 DIAGNOSIS — E7800 Pure hypercholesterolemia, unspecified: Secondary | ICD-10-CM | POA: Diagnosis not present

## 2021-02-20 DIAGNOSIS — Z79899 Other long term (current) drug therapy: Secondary | ICD-10-CM | POA: Diagnosis not present

## 2021-02-20 DIAGNOSIS — R5383 Other fatigue: Secondary | ICD-10-CM | POA: Diagnosis not present

## 2021-02-20 DIAGNOSIS — Z1331 Encounter for screening for depression: Secondary | ICD-10-CM | POA: Diagnosis not present

## 2021-02-20 DIAGNOSIS — Z1339 Encounter for screening examination for other mental health and behavioral disorders: Secondary | ICD-10-CM | POA: Diagnosis not present

## 2021-03-25 ENCOUNTER — Ambulatory Visit
Admission: RE | Admit: 2021-03-25 | Discharge: 2021-03-25 | Disposition: A | Discharge status: Home / Self Care | Payer: HMO | Source: Ambulatory Visit | Attending: Internal Medicine | Admitting: Internal Medicine

## 2021-03-25 ENCOUNTER — Other Ambulatory Visit: Payer: Self-pay | Admitting: Internal Medicine

## 2021-03-25 ENCOUNTER — Other Ambulatory Visit: Payer: Self-pay

## 2021-03-25 DIAGNOSIS — Z1231 Encounter for screening mammogram for malignant neoplasm of breast: Secondary | ICD-10-CM

## 2021-04-05 DIAGNOSIS — F29 Unspecified psychosis not due to a substance or known physiological condition: Secondary | ICD-10-CM | POA: Diagnosis not present

## 2021-04-17 DIAGNOSIS — E785 Hyperlipidemia, unspecified: Secondary | ICD-10-CM | POA: Diagnosis not present

## 2021-04-17 DIAGNOSIS — F319 Bipolar disorder, unspecified: Secondary | ICD-10-CM | POA: Diagnosis not present

## 2021-04-17 DIAGNOSIS — E1169 Type 2 diabetes mellitus with other specified complication: Secondary | ICD-10-CM | POA: Diagnosis not present

## 2021-04-17 DIAGNOSIS — R32 Unspecified urinary incontinence: Secondary | ICD-10-CM | POA: Diagnosis not present

## 2021-04-17 DIAGNOSIS — K5900 Constipation, unspecified: Secondary | ICD-10-CM | POA: Diagnosis not present

## 2021-04-17 DIAGNOSIS — Z794 Long term (current) use of insulin: Secondary | ICD-10-CM | POA: Diagnosis not present

## 2021-04-17 DIAGNOSIS — E1122 Type 2 diabetes mellitus with diabetic chronic kidney disease: Secondary | ICD-10-CM | POA: Diagnosis not present

## 2021-04-17 DIAGNOSIS — Z6841 Body Mass Index (BMI) 40.0 and over, adult: Secondary | ICD-10-CM | POA: Diagnosis not present

## 2021-04-17 DIAGNOSIS — I10 Essential (primary) hypertension: Secondary | ICD-10-CM | POA: Diagnosis not present

## 2021-04-17 DIAGNOSIS — N183 Chronic kidney disease, stage 3 unspecified: Secondary | ICD-10-CM | POA: Diagnosis not present

## 2021-04-17 DIAGNOSIS — K219 Gastro-esophageal reflux disease without esophagitis: Secondary | ICD-10-CM | POA: Diagnosis not present

## 2021-04-23 DIAGNOSIS — E1121 Type 2 diabetes mellitus with diabetic nephropathy: Secondary | ICD-10-CM | POA: Diagnosis not present

## 2021-04-29 DIAGNOSIS — E1122 Type 2 diabetes mellitus with diabetic chronic kidney disease: Secondary | ICD-10-CM | POA: Diagnosis not present

## 2021-04-29 DIAGNOSIS — Z789 Other specified health status: Secondary | ICD-10-CM | POA: Diagnosis not present

## 2021-04-29 DIAGNOSIS — I10 Essential (primary) hypertension: Secondary | ICD-10-CM | POA: Diagnosis not present

## 2021-04-29 DIAGNOSIS — Z299 Encounter for prophylactic measures, unspecified: Secondary | ICD-10-CM | POA: Diagnosis not present

## 2021-04-29 DIAGNOSIS — Z6841 Body Mass Index (BMI) 40.0 and over, adult: Secondary | ICD-10-CM | POA: Diagnosis not present

## 2021-04-29 DIAGNOSIS — N183 Chronic kidney disease, stage 3 unspecified: Secondary | ICD-10-CM | POA: Diagnosis not present

## 2021-04-29 DIAGNOSIS — E1165 Type 2 diabetes mellitus with hyperglycemia: Secondary | ICD-10-CM | POA: Diagnosis not present

## 2021-05-13 DIAGNOSIS — Z6841 Body Mass Index (BMI) 40.0 and over, adult: Secondary | ICD-10-CM | POA: Diagnosis not present

## 2021-05-13 DIAGNOSIS — E1129 Type 2 diabetes mellitus with other diabetic kidney complication: Secondary | ICD-10-CM | POA: Diagnosis not present

## 2021-05-13 DIAGNOSIS — E1122 Type 2 diabetes mellitus with diabetic chronic kidney disease: Secondary | ICD-10-CM | POA: Diagnosis not present

## 2021-05-13 DIAGNOSIS — R809 Proteinuria, unspecified: Secondary | ICD-10-CM | POA: Diagnosis not present

## 2021-05-13 DIAGNOSIS — N189 Chronic kidney disease, unspecified: Secondary | ICD-10-CM | POA: Diagnosis not present

## 2021-05-13 DIAGNOSIS — I129 Hypertensive chronic kidney disease with stage 1 through stage 4 chronic kidney disease, or unspecified chronic kidney disease: Secondary | ICD-10-CM | POA: Diagnosis not present

## 2021-08-01 DIAGNOSIS — L989 Disorder of the skin and subcutaneous tissue, unspecified: Secondary | ICD-10-CM | POA: Diagnosis not present

## 2021-08-01 DIAGNOSIS — I10 Essential (primary) hypertension: Secondary | ICD-10-CM | POA: Diagnosis not present

## 2021-08-01 DIAGNOSIS — L570 Actinic keratosis: Secondary | ICD-10-CM | POA: Diagnosis not present

## 2021-08-01 DIAGNOSIS — E1122 Type 2 diabetes mellitus with diabetic chronic kidney disease: Secondary | ICD-10-CM | POA: Diagnosis not present

## 2021-08-01 DIAGNOSIS — E1165 Type 2 diabetes mellitus with hyperglycemia: Secondary | ICD-10-CM | POA: Diagnosis not present

## 2021-08-01 DIAGNOSIS — N189 Chronic kidney disease, unspecified: Secondary | ICD-10-CM | POA: Diagnosis not present

## 2021-08-01 DIAGNOSIS — Z6841 Body Mass Index (BMI) 40.0 and over, adult: Secondary | ICD-10-CM | POA: Diagnosis not present

## 2021-08-01 DIAGNOSIS — Z299 Encounter for prophylactic measures, unspecified: Secondary | ICD-10-CM | POA: Diagnosis not present

## 2021-08-14 DIAGNOSIS — Z6841 Body Mass Index (BMI) 40.0 and over, adult: Secondary | ICD-10-CM | POA: Diagnosis not present

## 2021-08-14 DIAGNOSIS — E1122 Type 2 diabetes mellitus with diabetic chronic kidney disease: Secondary | ICD-10-CM | POA: Diagnosis not present

## 2021-08-14 DIAGNOSIS — E211 Secondary hyperparathyroidism, not elsewhere classified: Secondary | ICD-10-CM | POA: Diagnosis not present

## 2021-08-14 DIAGNOSIS — R809 Proteinuria, unspecified: Secondary | ICD-10-CM | POA: Diagnosis not present

## 2021-08-14 DIAGNOSIS — I129 Hypertensive chronic kidney disease with stage 1 through stage 4 chronic kidney disease, or unspecified chronic kidney disease: Secondary | ICD-10-CM | POA: Diagnosis not present

## 2021-08-14 DIAGNOSIS — N189 Chronic kidney disease, unspecified: Secondary | ICD-10-CM | POA: Diagnosis not present

## 2021-08-14 DIAGNOSIS — E1129 Type 2 diabetes mellitus with other diabetic kidney complication: Secondary | ICD-10-CM | POA: Diagnosis not present

## 2021-08-18 DIAGNOSIS — E7800 Pure hypercholesterolemia, unspecified: Secondary | ICD-10-CM | POA: Diagnosis not present

## 2021-08-18 DIAGNOSIS — I10 Essential (primary) hypertension: Secondary | ICD-10-CM | POA: Diagnosis not present

## 2021-09-02 DIAGNOSIS — I10 Essential (primary) hypertension: Secondary | ICD-10-CM | POA: Diagnosis not present

## 2021-09-02 DIAGNOSIS — Z6841 Body Mass Index (BMI) 40.0 and over, adult: Secondary | ICD-10-CM | POA: Diagnosis not present

## 2021-09-02 DIAGNOSIS — Z299 Encounter for prophylactic measures, unspecified: Secondary | ICD-10-CM | POA: Diagnosis not present

## 2021-09-02 DIAGNOSIS — E1165 Type 2 diabetes mellitus with hyperglycemia: Secondary | ICD-10-CM | POA: Diagnosis not present

## 2021-09-02 DIAGNOSIS — Z713 Dietary counseling and surveillance: Secondary | ICD-10-CM | POA: Diagnosis not present

## 2021-09-13 DIAGNOSIS — F29 Unspecified psychosis not due to a substance or known physiological condition: Secondary | ICD-10-CM | POA: Diagnosis not present

## 2021-11-05 DIAGNOSIS — E1122 Type 2 diabetes mellitus with diabetic chronic kidney disease: Secondary | ICD-10-CM | POA: Diagnosis not present

## 2021-11-05 DIAGNOSIS — I10 Essential (primary) hypertension: Secondary | ICD-10-CM | POA: Diagnosis not present

## 2021-11-05 DIAGNOSIS — E1165 Type 2 diabetes mellitus with hyperglycemia: Secondary | ICD-10-CM | POA: Diagnosis not present

## 2021-11-05 DIAGNOSIS — Z299 Encounter for prophylactic measures, unspecified: Secondary | ICD-10-CM | POA: Diagnosis not present

## 2021-11-05 DIAGNOSIS — F319 Bipolar disorder, unspecified: Secondary | ICD-10-CM | POA: Diagnosis not present

## 2021-11-05 DIAGNOSIS — Z6841 Body Mass Index (BMI) 40.0 and over, adult: Secondary | ICD-10-CM | POA: Diagnosis not present

## 2021-11-12 DIAGNOSIS — N189 Chronic kidney disease, unspecified: Secondary | ICD-10-CM | POA: Diagnosis not present

## 2021-11-14 DIAGNOSIS — R809 Proteinuria, unspecified: Secondary | ICD-10-CM | POA: Diagnosis not present

## 2021-11-14 DIAGNOSIS — N189 Chronic kidney disease, unspecified: Secondary | ICD-10-CM | POA: Diagnosis not present

## 2021-11-14 DIAGNOSIS — E211 Secondary hyperparathyroidism, not elsewhere classified: Secondary | ICD-10-CM | POA: Diagnosis not present

## 2021-11-14 DIAGNOSIS — E1129 Type 2 diabetes mellitus with other diabetic kidney complication: Secondary | ICD-10-CM | POA: Diagnosis not present

## 2021-11-14 DIAGNOSIS — E1122 Type 2 diabetes mellitus with diabetic chronic kidney disease: Secondary | ICD-10-CM | POA: Diagnosis not present

## 2021-11-14 DIAGNOSIS — I9589 Other hypotension: Secondary | ICD-10-CM | POA: Diagnosis not present

## 2021-11-14 DIAGNOSIS — Z8679 Personal history of other diseases of the circulatory system: Secondary | ICD-10-CM | POA: Diagnosis not present

## 2021-11-14 DIAGNOSIS — Z6841 Body Mass Index (BMI) 40.0 and over, adult: Secondary | ICD-10-CM | POA: Diagnosis not present

## 2021-11-20 DIAGNOSIS — I10 Essential (primary) hypertension: Secondary | ICD-10-CM | POA: Diagnosis not present

## 2021-11-20 DIAGNOSIS — Z6841 Body Mass Index (BMI) 40.0 and over, adult: Secondary | ICD-10-CM | POA: Diagnosis not present

## 2021-11-20 DIAGNOSIS — Z1211 Encounter for screening for malignant neoplasm of colon: Secondary | ICD-10-CM | POA: Diagnosis not present

## 2021-11-20 DIAGNOSIS — Z713 Dietary counseling and surveillance: Secondary | ICD-10-CM | POA: Diagnosis not present

## 2021-11-20 DIAGNOSIS — Z299 Encounter for prophylactic measures, unspecified: Secondary | ICD-10-CM | POA: Diagnosis not present

## 2021-11-20 DIAGNOSIS — E1165 Type 2 diabetes mellitus with hyperglycemia: Secondary | ICD-10-CM | POA: Diagnosis not present

## 2021-12-12 DIAGNOSIS — E1165 Type 2 diabetes mellitus with hyperglycemia: Secondary | ICD-10-CM | POA: Diagnosis not present

## 2021-12-12 DIAGNOSIS — N183 Chronic kidney disease, stage 3 unspecified: Secondary | ICD-10-CM | POA: Diagnosis not present

## 2021-12-12 DIAGNOSIS — Z299 Encounter for prophylactic measures, unspecified: Secondary | ICD-10-CM | POA: Diagnosis not present

## 2021-12-12 DIAGNOSIS — F319 Bipolar disorder, unspecified: Secondary | ICD-10-CM | POA: Diagnosis not present

## 2021-12-12 DIAGNOSIS — Z6841 Body Mass Index (BMI) 40.0 and over, adult: Secondary | ICD-10-CM | POA: Diagnosis not present

## 2021-12-12 DIAGNOSIS — I10 Essential (primary) hypertension: Secondary | ICD-10-CM | POA: Diagnosis not present

## 2021-12-19 DIAGNOSIS — I10 Essential (primary) hypertension: Secondary | ICD-10-CM | POA: Diagnosis not present

## 2021-12-19 DIAGNOSIS — N189 Chronic kidney disease, unspecified: Secondary | ICD-10-CM | POA: Diagnosis not present

## 2022-01-10 DIAGNOSIS — E1122 Type 2 diabetes mellitus with diabetic chronic kidney disease: Secondary | ICD-10-CM | POA: Diagnosis not present

## 2022-01-10 DIAGNOSIS — I10 Essential (primary) hypertension: Secondary | ICD-10-CM | POA: Diagnosis not present

## 2022-01-10 DIAGNOSIS — Z23 Encounter for immunization: Secondary | ICD-10-CM | POA: Diagnosis not present

## 2022-01-10 DIAGNOSIS — Z299 Encounter for prophylactic measures, unspecified: Secondary | ICD-10-CM | POA: Diagnosis not present

## 2022-01-10 DIAGNOSIS — Z6841 Body Mass Index (BMI) 40.0 and over, adult: Secondary | ICD-10-CM | POA: Diagnosis not present

## 2022-01-10 DIAGNOSIS — Z713 Dietary counseling and surveillance: Secondary | ICD-10-CM | POA: Diagnosis not present

## 2022-05-25 IMAGING — MG MM DIGITAL SCREENING BILAT W/ TOMO AND CAD
6 of 12 series · 6 of 36 positions shown · non-contrast
Comparison: Previous exam(s).

CLINICAL DATA: Screening.

EXAM:
DIGITAL SCREENING BILATERAL MAMMOGRAM WITH TOMOSYNTHESIS AND CAD
TECHNIQUE: Bilateral screening digital craniocaudal and mediolateral oblique
mammograms were obtained. Bilateral screening digital breast
tomosynthesis was performed. The images were evaluated with
computer-aided detection.

[L CC synth-2D (1 of 2)]
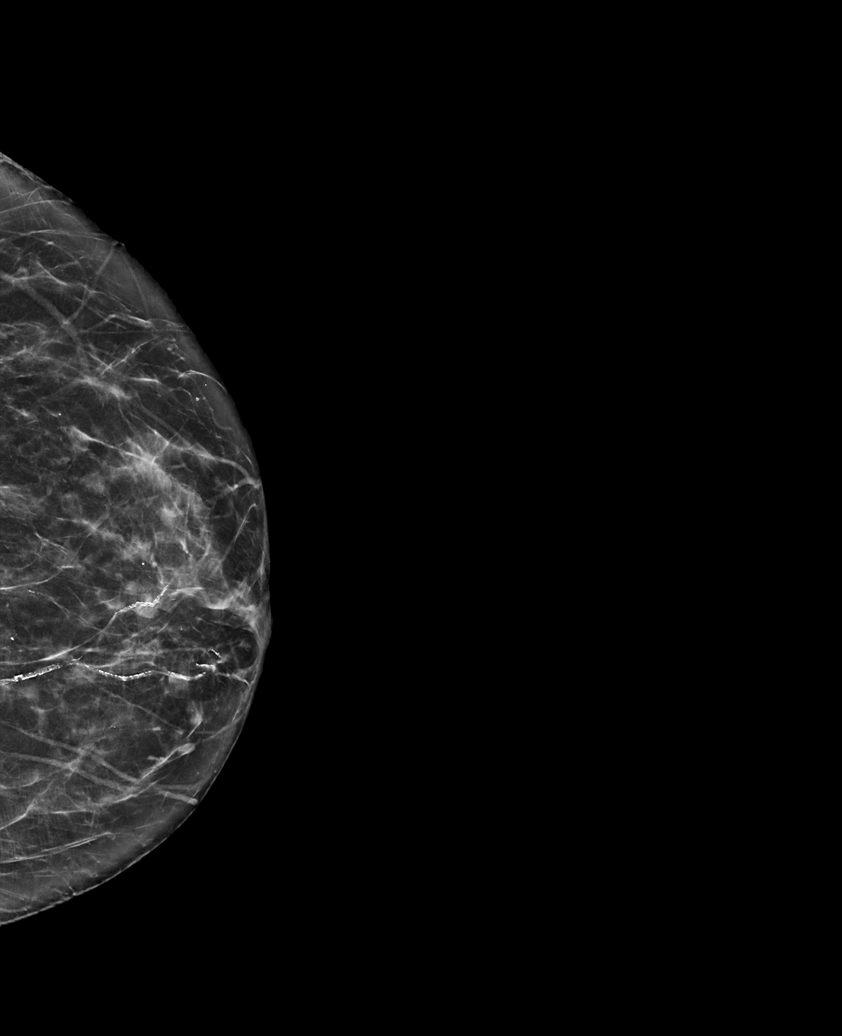

[R CC synth-2D (1 of 2)]
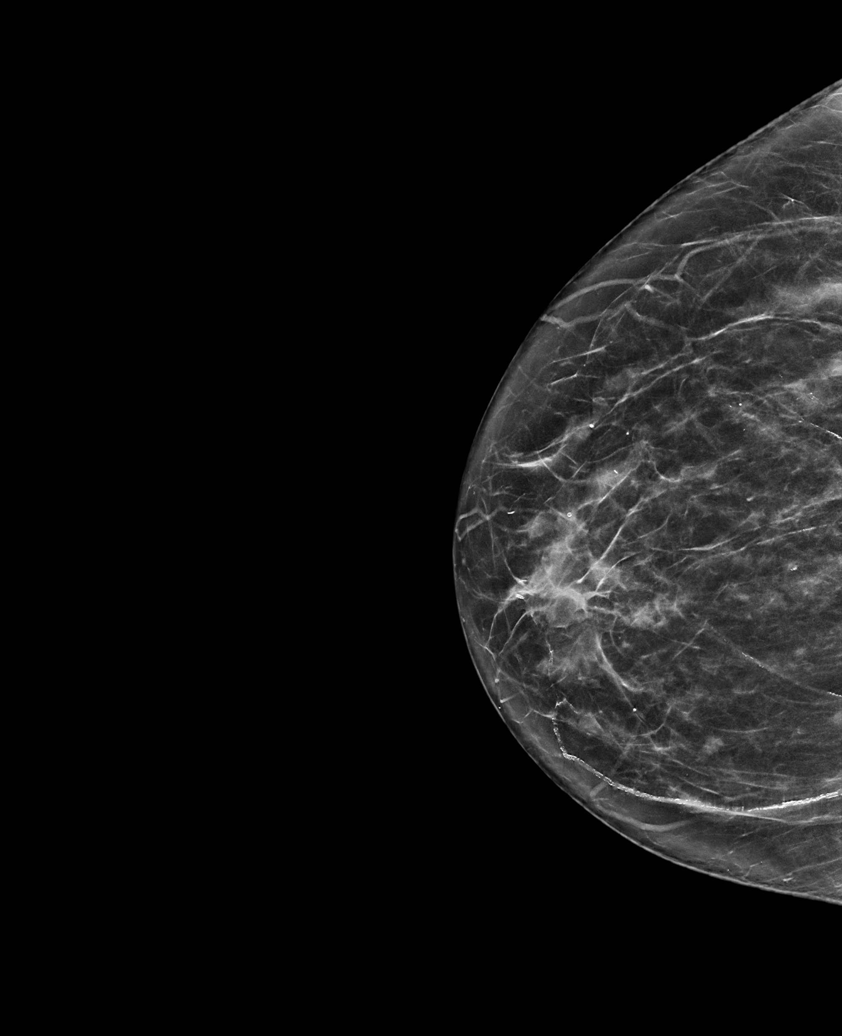

[R CC synth-2D (2 of 2)]
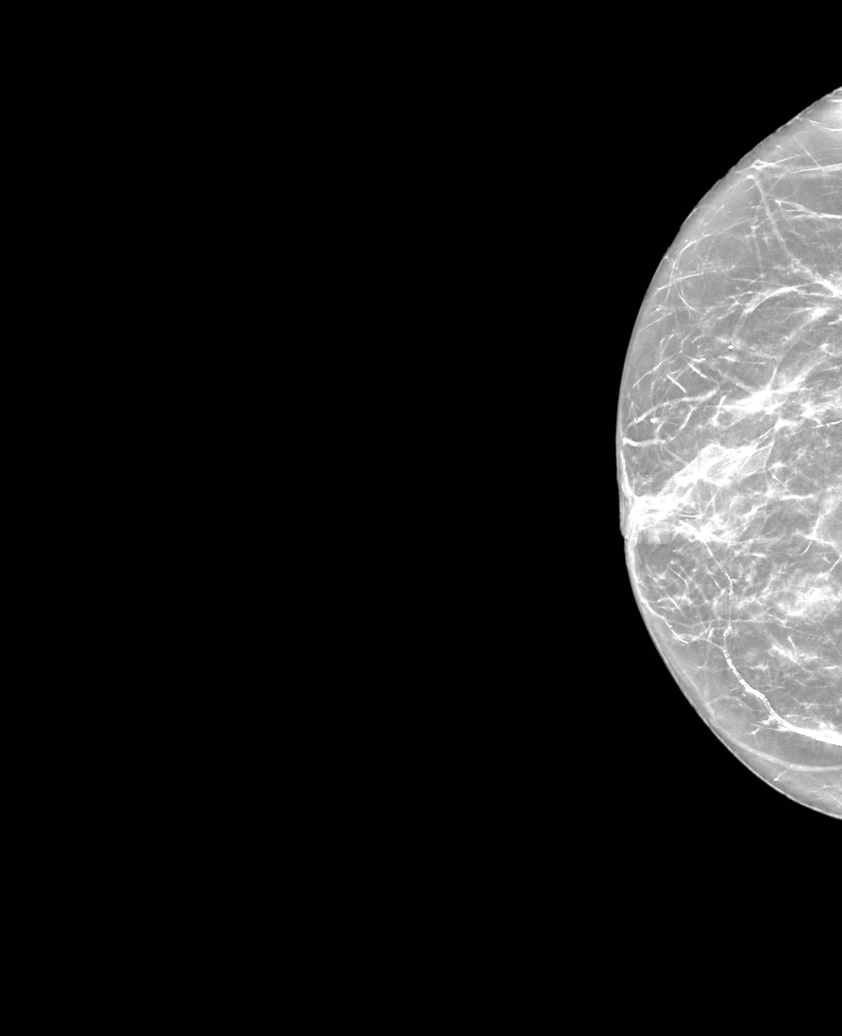

[R MLO synth-2D]
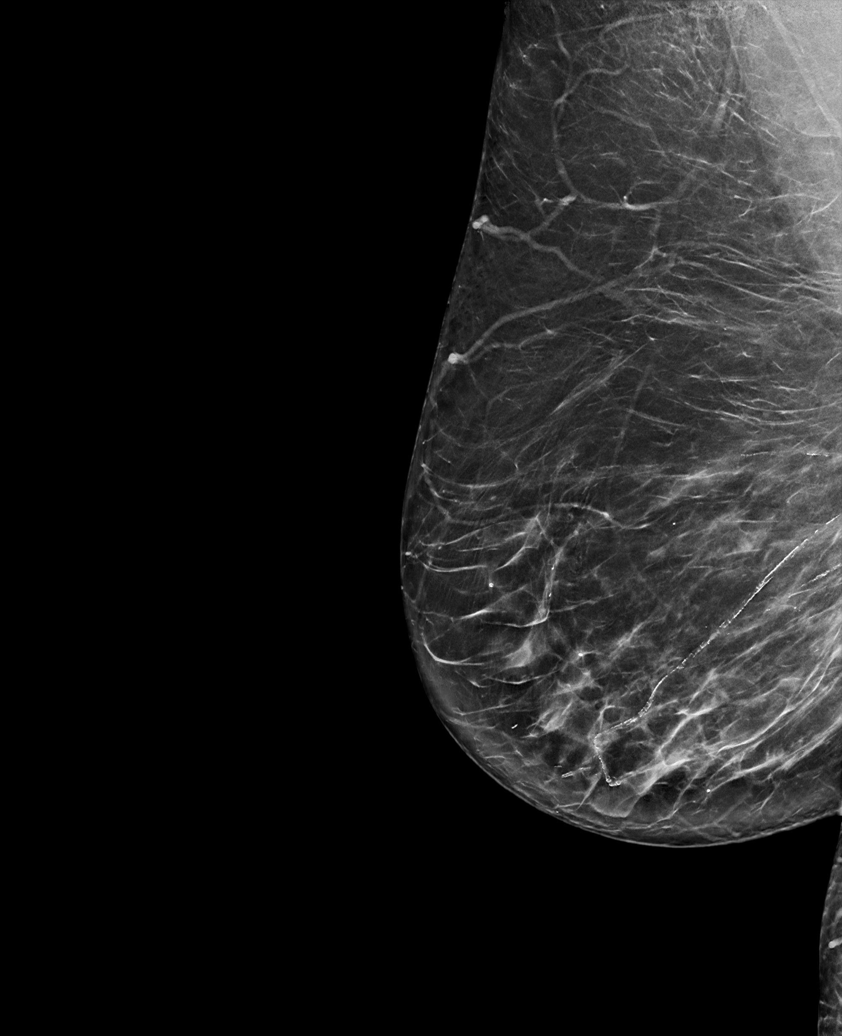

[L CC synth-2D (2 of 2)]
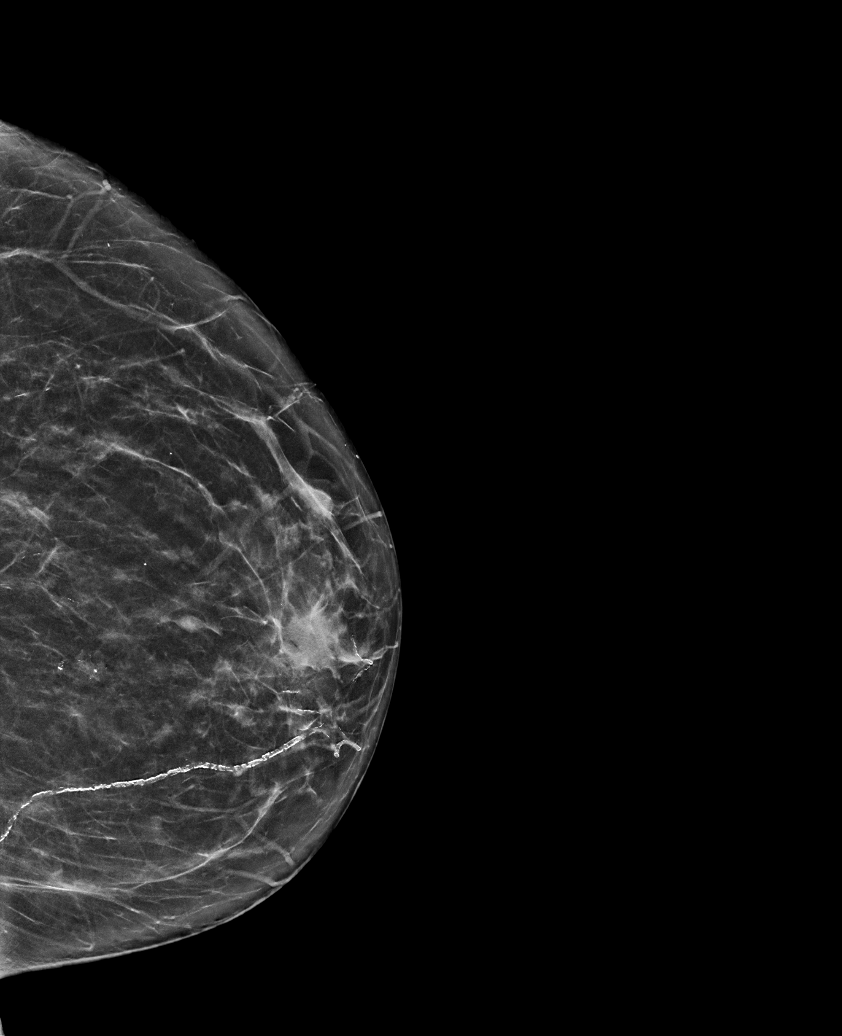

[L MLO synth-2D]
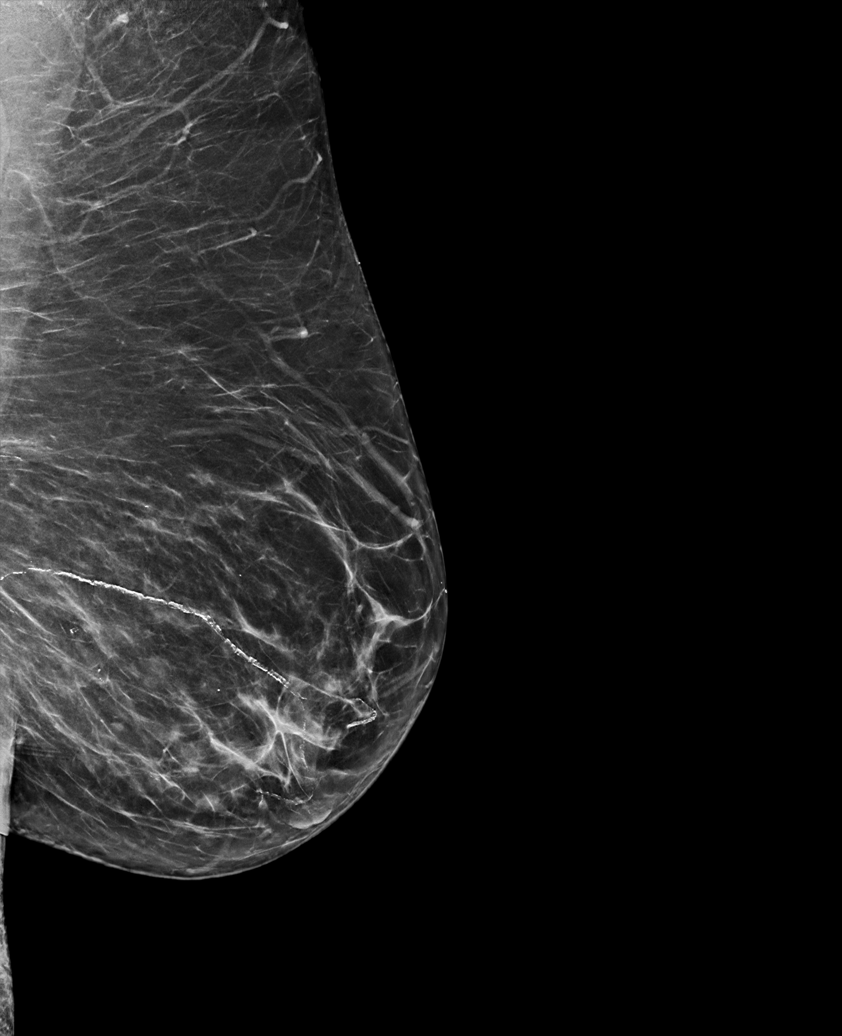

[6 of 36 positions shown; findings below may reference images not displayed]

ACR Breast Density Category b: There are scattered areas of
fibroglandular density.
FINDINGS: There are no findings suspicious for malignancy.
IMPRESSION: No mammographic evidence of malignancy. A result letter of this
screening mammogram will be mailed directly to the patient.

RECOMMENDATION:
Screening mammogram in one year. (Code:51-O-LD2)

BI-RADS CATEGORY  1: Negative.

## 2022-06-02 DIAGNOSIS — Z6841 Body Mass Index (BMI) 40.0 and over, adult: Secondary | ICD-10-CM | POA: Diagnosis not present

## 2022-06-02 DIAGNOSIS — E1142 Type 2 diabetes mellitus with diabetic polyneuropathy: Secondary | ICD-10-CM | POA: Diagnosis not present

## 2022-06-02 DIAGNOSIS — E1165 Type 2 diabetes mellitus with hyperglycemia: Secondary | ICD-10-CM | POA: Diagnosis not present

## 2022-06-02 DIAGNOSIS — I10 Essential (primary) hypertension: Secondary | ICD-10-CM | POA: Diagnosis not present

## 2022-06-02 DIAGNOSIS — E1122 Type 2 diabetes mellitus with diabetic chronic kidney disease: Secondary | ICD-10-CM | POA: Diagnosis not present

## 2022-06-02 DIAGNOSIS — Z299 Encounter for prophylactic measures, unspecified: Secondary | ICD-10-CM | POA: Diagnosis not present

## 2022-07-01 DIAGNOSIS — E1165 Type 2 diabetes mellitus with hyperglycemia: Secondary | ICD-10-CM | POA: Diagnosis not present

## 2022-07-01 DIAGNOSIS — I10 Essential (primary) hypertension: Secondary | ICD-10-CM | POA: Diagnosis not present

## 2022-07-01 DIAGNOSIS — Z299 Encounter for prophylactic measures, unspecified: Secondary | ICD-10-CM | POA: Diagnosis not present

## 2022-08-18 DIAGNOSIS — Z6841 Body Mass Index (BMI) 40.0 and over, adult: Secondary | ICD-10-CM | POA: Diagnosis not present

## 2022-08-18 DIAGNOSIS — I129 Hypertensive chronic kidney disease with stage 1 through stage 4 chronic kidney disease, or unspecified chronic kidney disease: Secondary | ICD-10-CM | POA: Diagnosis not present

## 2022-08-18 DIAGNOSIS — E1122 Type 2 diabetes mellitus with diabetic chronic kidney disease: Secondary | ICD-10-CM | POA: Diagnosis not present

## 2022-08-18 DIAGNOSIS — N186 End stage renal disease: Secondary | ICD-10-CM | POA: Diagnosis not present

## 2022-08-18 DIAGNOSIS — N189 Chronic kidney disease, unspecified: Secondary | ICD-10-CM | POA: Diagnosis not present

## 2022-08-18 DIAGNOSIS — E211 Secondary hyperparathyroidism, not elsewhere classified: Secondary | ICD-10-CM | POA: Diagnosis not present

## 2022-09-05 DIAGNOSIS — E1122 Type 2 diabetes mellitus with diabetic chronic kidney disease: Secondary | ICD-10-CM | POA: Diagnosis not present

## 2022-09-05 DIAGNOSIS — N186 End stage renal disease: Secondary | ICD-10-CM | POA: Diagnosis not present

## 2022-09-05 DIAGNOSIS — Z79899 Other long term (current) drug therapy: Secondary | ICD-10-CM | POA: Diagnosis not present

## 2022-09-06 DIAGNOSIS — N1832 Chronic kidney disease, stage 3b: Secondary | ICD-10-CM | POA: Diagnosis not present

## 2022-09-06 DIAGNOSIS — E1122 Type 2 diabetes mellitus with diabetic chronic kidney disease: Secondary | ICD-10-CM | POA: Diagnosis not present

## 2022-09-06 DIAGNOSIS — Z6841 Body Mass Index (BMI) 40.0 and over, adult: Secondary | ICD-10-CM | POA: Diagnosis not present

## 2022-09-06 DIAGNOSIS — I129 Hypertensive chronic kidney disease with stage 1 through stage 4 chronic kidney disease, or unspecified chronic kidney disease: Secondary | ICD-10-CM | POA: Diagnosis not present

## 2022-09-09 DIAGNOSIS — E1165 Type 2 diabetes mellitus with hyperglycemia: Secondary | ICD-10-CM | POA: Diagnosis not present

## 2022-09-09 DIAGNOSIS — E1122 Type 2 diabetes mellitus with diabetic chronic kidney disease: Secondary | ICD-10-CM | POA: Diagnosis not present

## 2022-09-09 DIAGNOSIS — I10 Essential (primary) hypertension: Secondary | ICD-10-CM | POA: Diagnosis not present

## 2022-09-09 DIAGNOSIS — N1832 Chronic kidney disease, stage 3b: Secondary | ICD-10-CM | POA: Diagnosis not present

## 2022-09-09 DIAGNOSIS — Z299 Encounter for prophylactic measures, unspecified: Secondary | ICD-10-CM | POA: Diagnosis not present

## 2022-09-09 DIAGNOSIS — K5900 Constipation, unspecified: Secondary | ICD-10-CM | POA: Diagnosis not present

## 2022-09-19 DIAGNOSIS — E1142 Type 2 diabetes mellitus with diabetic polyneuropathy: Secondary | ICD-10-CM | POA: Diagnosis not present

## 2022-09-19 DIAGNOSIS — Z299 Encounter for prophylactic measures, unspecified: Secondary | ICD-10-CM | POA: Diagnosis not present

## 2022-09-19 DIAGNOSIS — I10 Essential (primary) hypertension: Secondary | ICD-10-CM | POA: Diagnosis not present

## 2022-09-19 DIAGNOSIS — Z6841 Body Mass Index (BMI) 40.0 and over, adult: Secondary | ICD-10-CM | POA: Diagnosis not present

## 2022-09-25 DIAGNOSIS — E1165 Type 2 diabetes mellitus with hyperglycemia: Secondary | ICD-10-CM | POA: Diagnosis not present

## 2022-09-25 DIAGNOSIS — Z888 Allergy status to other drugs, medicaments and biological substances status: Secondary | ICD-10-CM | POA: Diagnosis not present

## 2022-09-25 DIAGNOSIS — I10 Essential (primary) hypertension: Secondary | ICD-10-CM | POA: Diagnosis not present

## 2022-10-08 ENCOUNTER — Ambulatory Visit: Payer: PPO | Admitting: Gastroenterology

## 2022-10-08 NOTE — Progress Notes (Deleted)
GI Office Note    Referring Provider: Kirstie Peri, MD Primary Care Physician:  Kirstie Peri, MD  Primary Gastroenterologist:  Chief Complaint   No chief complaint on file.    History of Present Illness   Erika Jacobs is a 72 y.o. female presenting today     Remotely seen by Dr. Teena Dunk.     Medications   Current Outpatient Medications  Medication Sig Dispense Refill   amLODipine (NORVASC) 5 MG tablet Take 1 tablet (5 mg total) by mouth daily. 30 tablet 1   aspirin EC 81 MG tablet Take 81 mg by mouth daily.     benztropine (COGENTIN) 1 MG tablet Take 1 tablet by mouth daily.     JARDIANCE 25 MG TABS tablet Take 25 mg by mouth daily.     metoprolol succinate (TOPROL-XL) 50 MG 24 hr tablet Take 50 mg by mouth daily.      Multiple Vitamin (MULTIVITAMIN) tablet Take 1 tablet by mouth daily.     NOVOLOG FLEXPEN 100 UNIT/ML FlexPen Inject 10-15 Units into the skin See admin instructions. 10 units at morning and afternoon, then take 15 units in the evening     oxybutynin (DITROPAN) 5 MG tablet Take 5 mg by mouth 2 (two) times daily.     OZEMPIC, 1 MG/DOSE, 2 MG/1.5ML SOPN Inject 0.75 mLs into the skin every Sunday.     paliperidone (INVEGA) 6 MG 24 hr tablet Take 6 mg by mouth daily.      pantoprazole (PROTONIX) 40 MG tablet Take 40 mg by mouth daily.     pravastatin (PRAVACHOL) 40 MG tablet Take 40 mg by mouth daily.     TRESIBA FLEXTOUCH 200 UNIT/ML SOPN Inject 64 Units into the skin every morning.      venlafaxine XR (EFFEXOR-XR) 75 MG 24 hr capsule Take 1 capsule (75 mg total) by mouth daily with breakfast. 30 capsule 0   No current facility-administered medications for this visit.    Allergies   Allergies as of 10/08/2022 - Review Complete 10/19/2019  Allergen Reaction Noted   Aripiprazole Other (See Comments) 11/16/2015   Invega [paliperidone]  11/16/2015   Lisinopril Other (See Comments) 11/16/2015   Risperidone and related  11/16/2015    Past Medical  History   Past Medical History:  Diagnosis Date   Anemia    Anxiety    Arthritis    Bipolar 1 disorder ([facility])    Chronic constipation    Chronic headache    CKD (chronic kidney disease)    Diabetes mellitus without complication ([facility])    Diabetic polyneuropathy ([facility])    Gastritis and duodenitis    GERD (gastroesophageal reflux disease)    Hypercholesteremia    Hypertension    Hypothyroid    Overactive bladder    Panic attack    Paranoia ([facility])    Psychosis ([facility])    Shortness of breath    Thought disorder     Past Surgical History   Past Surgical History:  Procedure Laterality Date   CHOLECYSTECTOMY N/A 06/02/2013   Procedure: LAPAROSCOPIC CHOLECYSTECTOMY;  Surgeon: Marlane Hatcher, MD;  Location: [facility];  Service: General;  Laterality: N/A;   COLONOSCOPY WITH ESOPHAGOGASTRODUODENOSCOPY (EGD) N/A 05/20/2013   Procedure: COLONOSCOPY WITH ESOPHAGOGASTRODUODENOSCOPY (EGD);  Surgeon: Malissa Hippo, MD;  Location[facility];  Service: Endoscopy;  Laterality: N/A;  730   DIALYSIS/PERMA CATHETER INSERTION N/A 10/17/2016   Procedure: Dialysis/Perma Catheter Insertion;  Surgeon: Renford Dills, MD;  Location: [facility];  Service: Cardiovascular;  Laterality: N/A;   EYE SURGERY Left    TUBAL LIGATION      Past Family History   Family History  Problem Relation Age of Onset   Breast cancer Neg Hx     Past Social History   Social History   Socioeconomic History   Marital status: Divorced    Spouse name: Not on file   Number of children: Not on file   Years of education: Not on file   Highest education level: Not on file  Occupational History   Not on file  Tobacco Use   Smoking status: Former    Packs/day: 2.00    Years: 30.00    Additional pack years: 0.00    Total pack years: 60.00    Types: Cigarettes    Start date: 10/18/1967    Quit date: 05/20/2002    Years since quitting: 20.4   Smokeless tobacco: Never  Substance and Sexual Activity    Alcohol use: No   Drug use: No   Sexual activity: Yes    Birth control/protection: Post-menopausal  Other Topics Concern   Not on file  Social History Narrative   Not on file   Social Determinants of Health   Financial Resource Strain: Not on file  Food Insecurity: Not on file  Transportation Needs: Not on file  Physical Activity: Not on file  Stress: Not on file  Social Connections: Not on file  Intimate Partner Violence: Not on file    Review of Systems   General: Negative for anorexia, weight loss, fever, chills, fatigue, weakness. Eyes: Negative for vision changes.  ENT: Negative for hoarseness, difficulty swallowing , nasal congestion. CV: Negative for chest pain, angina, palpitations, dyspnea on exertion, peripheral edema.  Respiratory: Negative for dyspnea at rest, dyspnea on exertion, cough, sputum, wheezing.  GI: See history of present illness. GU:  Negative for dysuria, hematuria, urinary incontinence, urinary frequency, nocturnal urination.  MS: Negative for joint pain, low back pain.  Derm: Negative for rash or itching.  Neuro: Negative for weakness, abnormal sensation, seizure, frequent headaches, memory loss,  confusion.  Psych: Negative for anxiety, depression, suicidal ideation, hallucinations.  Endo: Negative for unusual weight change.  Heme: Negative for bruising or bleeding. Allergy: Negative for rash or hives.  Physical Exam   LMP  (LMP Unknown)    General: Well-nourished, well-developed in no acute distress.  Head: Normocephalic, atraumatic.   Eyes: Conjunctiva pink, no icterus. Mouth: Oropharyngeal mucosa moist and pink , no lesions erythema or exudate. Neck: Supple without thyromegaly, masses, or lymphadenopathy.  Lungs: Clear to auscultation bilaterally.  Heart: Regular rate and rhythm, no murmurs rubs or gallops.  Abdomen: Bowel sounds are normal, nontender, nondistended, no hepatosplenomegaly or masses,  no abdominal bruits or hernia, no  rebound or guarding.   Rectal: *** Extremities: No lower extremity edema. No clubbing or deformities.  Neuro: Alert and oriented x 4 , grossly normal neurologically.  Skin: Warm and dry, no rash or jaundice.   Psych: Alert and cooperative, normal mood and affect.  Labs   *** Imaging Studies   No results found.  Assessment       PLAN   ***   Leanna Battles. Melvyn Neth, MHS, PA-C [facility]

## 2022-10-18 NOTE — Progress Notes (Unsigned)
Referring Provider:   Kirstie Peri, MD Primary Care Physician:  Kirstie Peri, MD Primary Gastroenterologist:  Dr. Levon Hedger  No chief complaint on file.   HPI:   Erika Jacobs is a 72 y.o. female presenting today at the request of   Kirstie Peri, MD for constipation.     EGD 05/20/2013: Mild changes of reflux esophagitis limited to GE junction, erosive antral gastritis and bulbar duodenitis.  Follow-up H. pylori IgG was negative.  Colonoscopy 05/20/2013: Moderate sigmoid colon diverticulosis, small external hemorrhoids.  Recommended repeat colonoscopy in 5 years due to family history.  Past Medical History:  Diagnosis Date   Anemia    Anxiety    Arthritis    Bipolar 1 disorder (HCC)    Chronic constipation    Chronic headache    CKD (chronic kidney disease)    Diabetes mellitus without complication (HCC)    Diabetic polyneuropathy (HCC)    Gastritis and duodenitis    GERD (gastroesophageal reflux disease)    Hypercholesteremia    Hypertension    Hypothyroid    Overactive bladder    Panic attack    Paranoia (HCC)    Psychosis (HCC)    Shortness of breath    Thought disorder     Past Surgical History:  Procedure Laterality Date   CHOLECYSTECTOMY N/A 06/02/2013   Procedure: LAPAROSCOPIC CHOLECYSTECTOMY;  Surgeon: Marlane Hatcher, MD;  Location: [facility];  Service: General;  Laterality: N/A;   COLONOSCOPY WITH ESOPHAGOGASTRODUODENOSCOPY (EGD) N/A 05/20/2013   Procedure: COLONOSCOPY WITH ESOPHAGOGASTRODUODENOSCOPY (EGD);  Surgeon: Malissa Hippo, MD;  Location: [facility];  Service: Endoscopy;  Laterality: N/A;  730   DIALYSIS/PERMA CATHETER INSERTION N/A 10/17/2016   Procedure: Dialysis/Perma Catheter Insertion;  Surgeon: Renford Dills, MD;  Location: [facility]IVE CV [facility];  Service: Cardiovascular;  Laterality: N/A;   EYE SURGERY Left    TUBAL LIGATION      Current Outpatient Medications  Medication Sig Dispense Refill   amLODipine (NORVASC) 5 MG tablet  Take 1 tablet (5 mg total) by mouth daily. 30 tablet 1   aspirin EC 81 MG tablet Take 81 mg by mouth daily.     benztropine (COGENTIN) 1 MG tablet Take 1 tablet by mouth daily.     JARDIANCE 25 MG TABS tablet Take 25 mg by mouth daily.     metoprolol succinate (TOPROL-XL) 50 MG 24 hr tablet Take 50 mg by mouth daily.      Multiple Vitamin (MULTIVITAMIN) tablet Take 1 tablet by mouth daily.     NOVOLOG FLEXPEN 100 UNIT/ML FlexPen Inject 10-15 Units into the skin See admin instructions. 10 units at morning and afternoon, then take 15 units in the evening     oxybutynin (DITROPAN) 5 MG tablet Take 5 mg by mouth 2 (two) times daily.     OZEMPIC, 1 MG/DOSE, 2 MG/1.5ML SOPN Inject 0.75 mLs into the skin every Sunday.     paliperidone (INVEGA) 6 MG 24 hr tablet Take 6 mg by mouth daily.      pantoprazole (PROTONIX) 40 MG tablet Take 40 mg by mouth daily.     pravastatin (PRAVACHOL) 40 MG tablet Take 40 mg by mouth daily.     TRESIBA FLEXTOUCH 200 UNIT/ML SOPN Inject 64 Units into the skin every morning.      venlafaxine XR (EFFEXOR-XR) 75 MG 24 hr capsule Take 1 capsule (75 mg total) by mouth daily with breakfast. 30 capsule 0   No current facility-administered medications for  this visit.    Allergies as of 10/20/2022 - Review Complete 10/19/2019  Allergen Reaction Noted   Aripiprazole Other (See Comments) 11/16/2015   Invega [paliperidone]  11/16/2015   Lisinopril Other (See Comments) 11/16/2015   Risperidone and related  11/16/2015    Family History  Problem Relation Age of Onset   Breast cancer Neg Hx     Social History   Socioeconomic History   Marital status: Divorced    Spouse name: Not on file   Number of children: Not on file   Years of education: Not on file   Highest education level: Not on file  Occupational History   Not on file  Tobacco Use   Smoking status: Former    Packs/day: 2.00    Years: 30.00    Additional pack years: 0.00    Total pack years: 60.00     Types: Cigarettes    Start date: 10/18/1967    Quit date: 05/20/2002    Years since quitting: 20.4   Smokeless tobacco: Never  Substance and Sexual Activity   Alcohol use: No   Drug use: No   Sexual activity: Yes    Birth control/protection: Post-menopausal  Other Topics Concern   Not on file  Social History Narrative   Not on file   Social Determinants of Health   Financial Resource Strain: Not on file  Food Insecurity: Not on file  Transportation Needs: Not on file  Physical Activity: Not on file  Stress: Not on file  Social Connections: Not on file  Intimate Partner Violence: Not on file    Review of Systems: Gen: Denies any fever, chills, fatigue, weight loss, lack of appetite.  CV: Denies chest pain, heart palpitations, peripheral edema, syncope.  Resp: Denies shortness of breath at rest or with exertion. Denies wheezing or cough.  GI: Denies dysphagia or odynophagia. Denies jaundice, hematemesis, fecal incontinence. GU : Denies urinary burning, urinary frequency, urinary hesitancy MS: Denies joint pain, muscle weakness, cramps, or limitation of movement.  Derm: Denies rash, itching, dry skin Psych: Denies depression, anxiety, memory loss, and confusion Heme: Denies bruising, bleeding, and enlarged lymph nodes.  Physical Exam: LMP  (LMP Unknown)  General:   Alert and oriented. Pleasant and cooperative. Well-nourished and well-developed.  Head:  Normocephalic and atraumatic. Eyes:  Without icterus, sclera clear and conjunctiva pink.  Ears:  Normal auditory acuity. Lungs:  Clear to auscultation bilaterally. No wheezes, rales, or rhonchi. No distress.  Heart:  S1, S2 present without murmurs appreciated.  Abdomen:  +BS, soft, non-tender and non-distended. No HSM noted. No guarding or rebound. No masses appreciated.  Rectal:  Deferred  Msk:  Symmetrical without gross deformities. Normal posture. Extremities:  Without edema. Neurologic:  Alert and  oriented x4;  grossly  normal neurologically. Skin:  Intact without significant lesions or rashes. Psych:  Alert and cooperative. Normal mood and affect.    Assessment:     Plan:  ***   Ermalinda Memos, PA-C [facility] 10/20/2022

## 2022-10-20 ENCOUNTER — Ambulatory Visit (INDEPENDENT_AMBULATORY_CARE_PROVIDER_SITE_OTHER): Payer: PPO | Admitting: Gastroenterology

## 2022-10-20 ENCOUNTER — Encounter: Payer: Self-pay | Admitting: Gastroenterology

## 2022-10-20 VITALS — BP 119/74 | HR 86 | Temp 97.7°F | Ht 60.0 in | Wt 230.6 lb

## 2022-10-20 DIAGNOSIS — Z800 Family history of malignant neoplasm of digestive organs: Secondary | ICD-10-CM

## 2022-10-20 DIAGNOSIS — K5900 Constipation, unspecified: Secondary | ICD-10-CM

## 2022-10-20 DIAGNOSIS — Z1211 Encounter for screening for malignant neoplasm of colon: Secondary | ICD-10-CM | POA: Diagnosis not present

## 2022-10-20 NOTE — Patient Instructions (Addendum)
We will arrange for you to have a colonoscopy in the near future with Dr. Levon Hedger at [facility]. Medication adjustments prior to your colonoscopy: Hold Ozempic x 1 week prior to colonoscopy. Hold Jardiance x 3 days prior to colonoscopy. 1 day prior to colonoscopy: Take one half dose of Tresiba and take NovoLog as needed. Day of colonoscopy: Do not take any diabetes medications the morning of your procedure.  You will also need to be sure to take MiraLAX twice a day every day for 10 days prior to colonoscopy to ensure that your bowels are moving well.  On day 5, if you are not having good, productive bowel movements every day, please let me know.  We will follow-up with you in the office as needed.  Do not hesitate to call if you have questions or concerns.  It was very nice to meet you today!  Ermalinda Memos, PA-C [facility]

## 2022-10-21 ENCOUNTER — Encounter: Payer: Self-pay | Admitting: Gastroenterology

## 2022-11-13 ENCOUNTER — Telehealth: Payer: Self-pay | Admitting: *Deleted

## 2022-11-13 NOTE — Telephone Encounter (Signed)
LMOVM to call back to schedule TCS with Dr. Levon Hedger ASA 3

## 2022-12-04 NOTE — Telephone Encounter (Signed)
LMTCB. Letter mailed ?

## 2022-12-09 DIAGNOSIS — F319 Bipolar disorder, unspecified: Secondary | ICD-10-CM | POA: Diagnosis not present

## 2022-12-09 DIAGNOSIS — E559 Vitamin D deficiency, unspecified: Secondary | ICD-10-CM | POA: Diagnosis not present

## 2022-12-09 DIAGNOSIS — Z79899 Other long term (current) drug therapy: Secondary | ICD-10-CM | POA: Diagnosis not present

## 2022-12-09 DIAGNOSIS — E7800 Pure hypercholesterolemia, unspecified: Secondary | ICD-10-CM | POA: Diagnosis not present

## 2022-12-09 DIAGNOSIS — Z1211 Encounter for screening for malignant neoplasm of colon: Secondary | ICD-10-CM | POA: Diagnosis not present

## 2022-12-09 DIAGNOSIS — R5383 Other fatigue: Secondary | ICD-10-CM | POA: Diagnosis not present

## 2022-12-09 DIAGNOSIS — Z299 Encounter for prophylactic measures, unspecified: Secondary | ICD-10-CM | POA: Diagnosis not present

## 2022-12-09 DIAGNOSIS — Z0000 Encounter for general adult medical examination without abnormal findings: Secondary | ICD-10-CM | POA: Diagnosis not present

## 2022-12-09 DIAGNOSIS — I10 Essential (primary) hypertension: Secondary | ICD-10-CM | POA: Diagnosis not present

## 2022-12-15 DIAGNOSIS — Z79899 Other long term (current) drug therapy: Secondary | ICD-10-CM | POA: Diagnosis not present

## 2022-12-15 DIAGNOSIS — N189 Chronic kidney disease, unspecified: Secondary | ICD-10-CM | POA: Diagnosis not present

## 2022-12-15 DIAGNOSIS — D631 Anemia in chronic kidney disease: Secondary | ICD-10-CM | POA: Diagnosis not present

## 2022-12-15 DIAGNOSIS — R809 Proteinuria, unspecified: Secondary | ICD-10-CM | POA: Diagnosis not present

## 2022-12-15 DIAGNOSIS — N1832 Chronic kidney disease, stage 3b: Secondary | ICD-10-CM | POA: Diagnosis not present

## 2022-12-15 DIAGNOSIS — E211 Secondary hyperparathyroidism, not elsewhere classified: Secondary | ICD-10-CM | POA: Diagnosis not present

## 2022-12-18 MED ORDER — PEG 3350-KCL-NA BICARB-NACL 420 G PO SOLR: 4000.0000 mL | Freq: Once | ORAL | 0 refills | Status: AC

## 2022-12-18 NOTE — Telephone Encounter (Signed)
Pt called. Scheduled for 9/17. Aware will call back with pre-op appt. Aware will mail instructions.

## 2022-12-18 NOTE — Addendum Note (Signed)
Addended by: Armstead Peaks on: 12/18/2022 12:09 PM   Modules accepted: Orders

## 2022-12-18 NOTE — Telephone Encounter (Signed)
Called pt, left detailed message with pre-op appt information.

## 2022-12-20 DIAGNOSIS — N2581 Secondary hyperparathyroidism of renal origin: Secondary | ICD-10-CM | POA: Diagnosis not present

## 2022-12-20 DIAGNOSIS — R809 Proteinuria, unspecified: Secondary | ICD-10-CM | POA: Diagnosis not present

## 2022-12-20 DIAGNOSIS — E1122 Type 2 diabetes mellitus with diabetic chronic kidney disease: Secondary | ICD-10-CM | POA: Diagnosis not present

## 2022-12-20 DIAGNOSIS — I129 Hypertensive chronic kidney disease with stage 1 through stage 4 chronic kidney disease, or unspecified chronic kidney disease: Secondary | ICD-10-CM | POA: Diagnosis not present

## 2022-12-23 NOTE — Telephone Encounter (Signed)
Pt called and left vm stating that she hadn't received her instructions for her upcoming colonoscopy.   Called pt and advised her that instructions was mailed to her on 12/18/22 and she should be getting them in the mail. Advised pt of her pre-op date. Pt verbalized understanding.

## 2023-01-01 NOTE — Patient Instructions (Addendum)
Erika Jacobs  01/01/2023     @PREFPERIOPPHARMACY @   Your procedure is scheduled on 01/06/23.  Report to [facility] at 1115 A.M.  Call this number if you have problems the morning of surgery:  [phone]  If you experience any cold or flu symptoms such as cough, fever, chills, shortness of breath, etc. between now and your scheduled surgery, please notify us at the above number.   Remember:  Do not eat or drink after midnight.     Take these medicines the morning of surgery with A SIP OF WATER norvasc, cogentin, metoprolol, ditropan, invega, protonix & effexor.  TAKE 3 UNITS NOVOLOG INSULIN & TRESIBA 30 UNITS @ BEDTIME 9/16 TAKE NO DIABETIC MEDICATIONS PRIOR TO COMING TO THE HOSPITAL    Do not wear jewelry, make-up or nail polish, including gel polish,  artificial nails, or any other type of covering on natural nails (fingers and  toes).  Do not wear lotions, powders, or perfumes, or deodorant.  Do not shave 48 hours prior to surgery.  Men may shave face and neck.  Do not bring valuables to the hospital.  [facility] is not responsible for any belongings or valuables.  Contacts, dentures or bridgework may not be worn into surgery.  Leave your suitcase in the car.  After surgery it may be brought to your room.  For patients admitted to the hospital, discharge time will be determined by your treatment team.  Patients discharged the day of surgery will not be allowed to drive home.   Name and phone number of your driver:   family Special instructions:  FOLLOW DIET & PREP INSTRUCTIONS PROVIDED TO YOU FROM DOCTOR'S OFFICE  Please read over the following fact sheets that you were given. Anesthesia Post-op Instructions and Care and Recovery After Surgery      PATIENT INSTRUCTIONS POST-ANESTHESIA  IMMEDIATELY FOLLOWING SURGERY:  Do not drive or operate machinery for the first twenty four hours after surgery.  Do not make any important decisions for twenty four hours  after surgery or while taking narcotic pain medications or sedatives.  If you develop intractable nausea and vomiting or a severe headache please notify your doctor immediately.  FOLLOW-UP:  Please make an appointment with your surgeon as instructed. You do not need to follow up with anesthesia unless specifically instructed to do so.  WOUND CARE INSTRUCTIONS (if applicable):  Keep a dry clean dressing on the anesthesia/puncture wound site if there is drainage.  Once the wound has quit draining you may leave it open to air.  Generally you should leave the bandage intact for twenty four hours unless there is drainage.  If the epidural site drains for more than 36-48 hours please call the anesthesia department.  QUESTIONS?:  Please feel free to call your physician or the hospital operator if you have any questions, and they will be happy to assist you.      Colonoscopy, Adult A colonoscopy is a procedure to look at the entire large intestine. This procedure is done using a long, thin, flexible tube that has a camera on the end. You may have a colonoscopy: As a part of normal colorectal screening. If you have certain symptoms, such as: A low number of red blood cells in your blood (anemia). Diarrhea that does not go away. Pain in your abdomen. Blood in your stool. A colonoscopy can help screen for and diagnose medical problems, including: An abnormal growth of cells or tissue (tumor). Abnormal growths within  the lining of your intestine (polyps). Inflammation. Areas of bleeding. Tell your health care provider about: Any allergies you have. All medicines you are taking, including vitamins, herbs, eye drops, creams, and over-the-counter medicines. Any problems you or family members have had with anesthetic medicines. Any bleeding problems you have. Any surgeries you have had. Any medical conditions you have. Any problems you have had with having bowel movements. Whether you are pregnant or  may be pregnant. What are the risks? Generally, this is a safe procedure. However, problems may occur, including: Bleeding. Damage to your intestine. Allergic reactions to medicines given during the procedure. Infection. This is rare. What happens before the procedure? Eating and drinking restrictions Follow instructions from your health care provider about eating or drinking restrictions, which may include: A few days before the procedure: Follow a low-fiber diet. Avoid nuts, seeds, dried fruit, raw fruits, and vegetables. 1-3 days before the procedure: Eat only gelatin dessert or ice pops. Drink only clear liquids, such as water, clear juice, clear broth or bouillon, black coffee or tea, or clear soft drinks or sports drinks. Avoid liquids that contain red or purple dye. The day of the procedure: Do not eat solid foods. You may continue to drink clear liquids until up to 2 hours before the procedure. Do not eat or drink anything starting 2 hours before the procedure, or within the time period that your health care provider recommends. Bowel prep If you were prescribed a bowel prep to take by mouth (orally) to clean out your colon: Take it as told by your health care provider. Starting the day before your procedure, you will need to drink a large amount of liquid medicine. The liquid will cause you to have many bowel movements of loose stool until your stool becomes almost clear or light green. If your skin or the opening between the buttocks (anus) gets irritated from diarrhea, you may relieve the irritation using: Wipes with medicine in them, such as adult wet wipes with aloe and vitamin E. A product to soothe skin, such as petroleum jelly. If you vomit while drinking the bowel prep: Take a break for up to 60 minutes. Begin the bowel prep again. Call your health care provider if you keep vomiting or you cannot take the bowel prep without vomiting. To clean out your colon, you may  also be given: Laxative medicines. These help you have a bowel movement. Instructions for enema use. An enema is liquid medicine injected into your rectum. Medicines Ask your health care provider about: Changing or stopping your regular medicines or supplements. This is especially important if you are taking iron supplements, diabetes medicines, or blood thinners. Taking medicines such as aspirin and ibuprofen. These medicines can thin your blood. Do not take these medicines unless your health care provider tells you to take them. Taking over-the-counter medicines, vitamins, herbs, and supplements. General instructions Ask your health care provider what steps will be taken to help prevent infection. These may include washing skin with a germ-killing soap. If you will be going home right after the procedure, plan to have a responsible adult: Take you home from the hospital or clinic. You will not be allowed to drive. Care for you for the time you are told. What happens during the procedure?  An IV will be inserted into one of your veins. You will be given a medicine to make you fall asleep (general anesthetic). You will lie on your side with your knees bent. A lubricant will  be put on the tube. Then the tube will be: Inserted into your anus. Gently eased through all parts of your large intestine. Air will be sent into your colon to keep it open. This may cause some pressure or cramping. Images will be taken with the camera and will appear on a screen. A small tissue sample may be removed to be looked at under a microscope (biopsy). The tissue may be sent to a lab for testing if any signs of problems are found. If small polyps are found, they may be removed and checked for cancer cells. When the procedure is finished, the tube will be removed. The procedure may vary among health care providers and hospitals. What happens after the procedure? Your blood pressure, heart rate, breathing rate,  and blood oxygen level will be monitored until you leave the hospital or clinic. You may have a small amount of blood in your stool. You may pass gas and have mild cramping or bloating in your abdomen. This is caused by the air that was used to open your colon during the exam. If you were given a sedative during the procedure, it can affect you for several hours. Do not drive or operate machinery until your health care provider says that it is safe. It is up to you to get the results of your procedure. Ask your health care provider, or the department that is doing the procedure, when your results will be ready. Summary A colonoscopy is a procedure to look at the entire large intestine. Follow instructions from your health care provider about eating and drinking before the procedure. If you were prescribed an oral bowel prep to clean out your colon, take it as told by your health care provider. During the colonoscopy, a flexible tube with a camera on its end is inserted into the anus and then passed into all parts of the large intestine. This information is not intended to replace advice given to you by your health care provider. Make sure you discuss any questions you have with your health care provider. Document Revised: 05/20/2022 Document Reviewed: 11/28/2020 Elsevier Patient Education  2024 [employer] Colonoscopy, Adult, Care After The following information offers guidance on how to care for yourself after your procedure. Your health care provider may also give you more specific instructions. If you have problems or questions, contact your health care provider. What can I expect after the procedure? After the procedure, it is common to have: A small amount of blood in your stool for 24 hours after the procedure. Some gas. Mild cramping or bloating of your abdomen. Follow these instructions at home: Eating and drinking  Drink enough fluid to keep your urine pale yellow. Follow  instructions from your health care provider about eating or drinking restrictions. Resume your normal diet as told by your health care provider. Avoid heavy or fried foods that are hard to digest. Activity Rest as told by your health care provider. Avoid sitting for a long time without moving. Get up to take short walks every 1-2 hours. This is important to improve blood flow and breathing. Ask for help if you feel weak or unsteady. Return to your normal activities as told by your health care provider. Ask your health care provider what activities are safe for you. Managing cramping and bloating  Try walking around when you have cramps or feel bloated. If directed, apply heat to your abdomen as told by your health care provider. Use the heat source that your  health care provider recommends, such as a moist heat pack or a heating pad. Place a towel between your skin and the heat source. Leave the heat on for 20-30 minutes. Remove the heat if your skin turns bright red. This is especially important if you are unable to feel pain, heat, or cold. You have a greater risk of getting burned. General instructions If you were given a sedative during the procedure, it can affect you for several hours. Do not drive or operate machinery until your health care provider says that it is safe. For the first 24 hours after the procedure: Do not sign important documents. Do not drink alcohol. Do your regular daily activities at a slower pace than normal. Eat soft foods that are easy to digest. Take over-the-counter and prescription medicines only as told by your health care provider. Keep all follow-up visits. This is important. Contact a health care provider if: You have blood in your stool 2-3 days after the procedure. Get help right away if: You have more than a small spotting of blood in your stool. You have large blood clots in your stool. You have swelling of your abdomen. You have nausea or  vomiting. You have a fever. You have increasing pain in your abdomen that is not relieved with medicine. These symptoms may be an emergency. Get help right away. Call 911. Do not wait to see if the symptoms will go away. Do not drive yourself to the hospital. Summary After the procedure, it is common to have a small amount of blood in your stool. You may also have mild cramping and bloating of your abdomen. If you were given a sedative during the procedure, it can affect you for several hours. Do not drive or operate machinery until your health care provider says that it is safe. Get help right away if you have a lot of blood in your stool, nausea or vomiting, a fever, or increased pain in your abdomen. This information is not intended to replace advice given to you by your health care provider. Make sure you discuss any questions you have with your health care provider. Document Revised: 05/20/2022 Document Reviewed: 11/28/2020 Elsevier Patient Education  2024 [employer].

## 2023-01-02 ENCOUNTER — Encounter (HOSPITAL_COMMUNITY)
Admission: RE | Admit: 2023-01-02 | Discharge: 2023-01-02 | Disposition: A | Discharge status: Home / Self Care | Payer: PPO | Source: Ambulatory Visit | Attending: Gastroenterology | Admitting: Gastroenterology

## 2023-01-02 ENCOUNTER — Encounter (HOSPITAL_COMMUNITY): Payer: Self-pay

## 2023-01-02 VITALS — BP 114/61 | HR 73 | Temp 97.6°F | Resp 18 | Ht 60.0 in | Wt 230.6 lb

## 2023-01-02 DIAGNOSIS — Z1211 Encounter for screening for malignant neoplasm of colon: Secondary | ICD-10-CM | POA: Insufficient documentation

## 2023-01-02 DIAGNOSIS — R9431 Abnormal electrocardiogram [ECG] [EKG]: Secondary | ICD-10-CM | POA: Diagnosis not present

## 2023-01-02 DIAGNOSIS — Z01810 Encounter for preprocedural cardiovascular examination: Secondary | ICD-10-CM | POA: Insufficient documentation

## 2023-01-02 DIAGNOSIS — Z01818 Encounter for other preprocedural examination: Secondary | ICD-10-CM | POA: Diagnosis present

## 2023-01-06 ENCOUNTER — Encounter (HOSPITAL_COMMUNITY): Payer: Self-pay | Admitting: Gastroenterology

## 2023-01-06 ENCOUNTER — Ambulatory Visit (HOSPITAL_COMMUNITY): Payer: PPO | Admitting: Anesthesiology

## 2023-01-06 ENCOUNTER — Ambulatory Visit (HOSPITAL_COMMUNITY)
Admission: RE | Admit: 2023-01-06 | Discharge: 2023-01-06 | Disposition: A | Discharge status: Home / Self Care | Payer: PPO | Attending: Gastroenterology | Admitting: Gastroenterology

## 2023-01-06 ENCOUNTER — Encounter (HOSPITAL_COMMUNITY)
Admission: RE | Disposition: A | Discharge status: Home / Self Care | Payer: Self-pay | Source: Home / Self Care | Attending: Gastroenterology

## 2023-01-06 DIAGNOSIS — D122 Benign neoplasm of ascending colon: Secondary | ICD-10-CM | POA: Diagnosis not present

## 2023-01-06 DIAGNOSIS — M1990 Unspecified osteoarthritis, unspecified site: Secondary | ICD-10-CM | POA: Diagnosis not present

## 2023-01-06 DIAGNOSIS — Z8500 Personal history of malignant neoplasm of unspecified digestive organ: Secondary | ICD-10-CM | POA: Diagnosis not present

## 2023-01-06 DIAGNOSIS — K219 Gastro-esophageal reflux disease without esophagitis: Secondary | ICD-10-CM | POA: Diagnosis not present

## 2023-01-06 DIAGNOSIS — Z1211 Encounter for screening for malignant neoplasm of colon: Secondary | ICD-10-CM

## 2023-01-06 DIAGNOSIS — Z7985 Long-term (current) use of injectable non-insulin antidiabetic drugs: Secondary | ICD-10-CM | POA: Diagnosis not present

## 2023-01-06 DIAGNOSIS — F209 Schizophrenia, unspecified: Secondary | ICD-10-CM | POA: Diagnosis not present

## 2023-01-06 DIAGNOSIS — E7800 Pure hypercholesterolemia, unspecified: Secondary | ICD-10-CM | POA: Diagnosis not present

## 2023-01-06 DIAGNOSIS — F419 Anxiety disorder, unspecified: Secondary | ICD-10-CM | POA: Diagnosis not present

## 2023-01-06 DIAGNOSIS — Z87891 Personal history of nicotine dependence: Secondary | ICD-10-CM | POA: Diagnosis not present

## 2023-01-06 DIAGNOSIS — K648 Other hemorrhoids: Secondary | ICD-10-CM | POA: Insufficient documentation

## 2023-01-06 DIAGNOSIS — Z794 Long term (current) use of insulin: Secondary | ICD-10-CM | POA: Insufficient documentation

## 2023-01-06 DIAGNOSIS — Z800 Family history of malignant neoplasm of digestive organs: Secondary | ICD-10-CM

## 2023-01-06 DIAGNOSIS — E1142 Type 2 diabetes mellitus with diabetic polyneuropathy: Secondary | ICD-10-CM | POA: Insufficient documentation

## 2023-01-06 DIAGNOSIS — E039 Hypothyroidism, unspecified: Secondary | ICD-10-CM | POA: Insufficient documentation

## 2023-01-06 DIAGNOSIS — E1122 Type 2 diabetes mellitus with diabetic chronic kidney disease: Secondary | ICD-10-CM | POA: Diagnosis not present

## 2023-01-06 DIAGNOSIS — I120 Hypertensive chronic kidney disease with stage 5 chronic kidney disease or end stage renal disease: Secondary | ICD-10-CM | POA: Insufficient documentation

## 2023-01-06 DIAGNOSIS — N186 End stage renal disease: Secondary | ICD-10-CM | POA: Insufficient documentation

## 2023-01-06 DIAGNOSIS — F319 Bipolar disorder, unspecified: Secondary | ICD-10-CM | POA: Diagnosis not present

## 2023-01-06 DIAGNOSIS — K5730 Diverticulosis of large intestine without perforation or abscess without bleeding: Secondary | ICD-10-CM | POA: Diagnosis not present

## 2023-01-06 DIAGNOSIS — Z7984 Long term (current) use of oral hypoglycemic drugs: Secondary | ICD-10-CM | POA: Insufficient documentation

## 2023-01-06 DIAGNOSIS — D124 Benign neoplasm of descending colon: Secondary | ICD-10-CM | POA: Diagnosis not present

## 2023-01-06 HISTORY — PX: COLONOSCOPY WITH PROPOFOL: SHX5780

## 2023-01-06 LAB — GLUCOSE, CAPILLARY: Glucose-Capillary: 130 mg/dL — ABNORMAL HIGH (ref 70–99)

## 2023-01-06 LAB — HM COLONOSCOPY

## 2023-01-06 SURGERY — COLONOSCOPY WITH PROPOFOL
Anesthesia: General

## 2023-01-06 MED ORDER — PROPOFOL 500 MG/50ML IV EMUL
INTRAVENOUS | Status: DC | PRN
  Administered 2023-01-06: 150 ug/kg/min via INTRAVENOUS

## 2023-01-06 MED ORDER — LACTATED RINGERS IV SOLN: INTRAVENOUS | Status: DC

## 2023-01-06 MED ORDER — PHENYLEPHRINE HCL (PRESSORS) 10 MG/ML IV SOLN
INTRAVENOUS | Status: DC | PRN
  Administered 2023-01-06 (×2): 160 ug via INTRAVENOUS

## 2023-01-06 NOTE — H&P (Signed)
Erika Jacobs is an 72 y.o. female.   Chief Complaint: family history colon cancer HPI: 72 year old female with history of anxiety, arthritis, bipolar disorder, CKD, diabetes, HTN, HLD, hypothyroidism, GERD, chronic constipation, coming for family history of colon cancer.  The patient denies having any nausea, vomiting, fever, chills, hematochezia, melena, hematemesis, abdominal distention, abdominal pain, diarrhea, jaundice, pruritus or weight loss.  Family history significant for mother with colon cancer, possibly in her 18s.   Past Medical History:  Diagnosis Date   Anemia    Anxiety    Arthritis    Bipolar 1 disorder (HCC)    Chronic constipation    Chronic headache    CKD (chronic kidney disease)    Diabetes mellitus without complication (HCC)    Diabetic polyneuropathy (HCC)    Gastritis and duodenitis    GERD (gastroesophageal reflux disease)    Hypercholesteremia    Hypertension    Hypothyroid    Overactive bladder    Panic attack    Paranoia (HCC)    Psychosis (HCC)    Shortness of breath    Thought disorder     Past Surgical History:  Procedure Laterality Date   CHOLECYSTECTOMY N/A 06/02/2013   Procedure: LAPAROSCOPIC CHOLECYSTECTOMY;  Surgeon: Marlane Hatcher, MD;  Location: [facility];  Service: General;  Laterality: N/A;   COLONOSCOPY WITH ESOPHAGOGASTRODUODENOSCOPY (EGD) N/A 05/20/2013   Procedure: COLONOSCOPY WITH ESOPHAGOGASTRODUODENOSCOPY (EGD);  Surgeon: Malissa Hippo, MD;  Location: [facility];  Service: Endoscopy;  Laterality: N/A;  730   DIALYSIS/PERMA CATHETER INSERTION N/A 10/17/2016   Procedure: Dialysis/Perma Catheter Insertion;  Surgeon: Renford Dills, MD;  Location: [facility];  Service: Cardiovascular;  Laterality: N/A;   EYE SURGERY Left    TUBAL LIGATION      Family History  Problem Relation Age of Onset   Colon cancer Mother    Breast cancer Neg Hx    Social History:  reports that she quit smoking about 20 years ago.  Her smoking use included cigarettes. She started smoking about 55 years ago. She has a 69.2 pack-year smoking history. She has never used smokeless tobacco. She reports that she does not drink alcohol and does not use drugs.  Allergies:  Allergies  Allergen Reactions   Aripiprazole Other (See Comments)    Pt shakes when taking this medication.   Invega [Paliperidone]     High doses causes patient to shake   Lisinopril Other (See Comments)    Kidney    Risperidone And Related     hallucinations    Medications Prior to Admission  Medication Sig Dispense Refill   amLODipine (NORVASC) 5 MG tablet Take 1 tablet (5 mg total) by mouth daily. 30 tablet 1   aspirin EC 81 MG tablet Take 81 mg by mouth daily.     atorvastatin (LIPITOR) 20 MG tablet Take by mouth.     benztropine (COGENTIN) 1 MG tablet Take 1 tablet by mouth daily.     metoprolol succinate (TOPROL-XL) 50 MG 24 hr tablet Take 50 mg by mouth daily.      Multiple Vitamin (MULTIVITAMIN) tablet Take 1 tablet by mouth daily.     oxybutynin (DITROPAN) 5 MG tablet Take 5 mg by mouth 2 (two) times daily.     pantoprazole (PROTONIX) 40 MG tablet Take 40 mg by mouth daily.     pravastatin (PRAVACHOL) 40 MG tablet Take 40 mg by mouth daily.     venlafaxine XR (EFFEXOR-XR) 75 MG 24  hr capsule Take 1 capsule (75 mg total) by mouth daily with breakfast. 30 capsule 0   JARDIANCE 25 MG TABS tablet Take 25 mg by mouth daily.     NOVOLOG FLEXPEN 100 UNIT/ML FlexPen Inject 10-15 Units into the skin See admin instructions. 5 units at morning, 10 units in the afternoon, then take 5 units in the evening     OZEMPIC, 1 MG/DOSE, 2 MG/1.5ML SOPN Inject 0.75 mLs into the skin every Sunday.     paliperidone (INVEGA) 6 MG 24 hr tablet Take 6 mg by mouth daily.  (Patient not taking: Reported on 10/20/2022)     TRESIBA FLEXTOUCH 200 UNIT/ML SOPN Inject 60 Units into the skin every evening.      Results for orders placed or performed during the hospital  encounter of 01/06/23 (from the past 48 hour(s))  Glucose, capillary     Status: Abnormal   Collection Time: 01/06/23 12:21 PM  Result Value Ref Range   Glucose-Capillary 130 (H) 70 - 99 mg/dL    Comment: Glucose reference range applies only to samples taken after fasting for at least 8 hours.   No results found.  Review of Systems  All other systems reviewed and are negative.   Blood pressure (!) 119/51, pulse 72, temperature 97.8 F (36.6 C), resp. rate 18, height 5' (1.524 m), weight 104.6 kg, SpO2 99%. Physical Exam  GENERAL: The patient is AO x3, in no acute distress. HEENT: Head is normocephalic and atraumatic. EOMI are intact. Mouth is well hydrated and without lesions. NECK: Supple. No masses LUNGS: Clear to auscultation. No presence of rhonchi/wheezing/rales. Adequate chest expansion HEART: RRR, normal s1 and s2. ABDOMEN: Soft, nontender, no guarding, no peritoneal signs, and nondistended. BS +. No masses. EXTREMITIES: Without any cyanosis, clubbing, rash, lesions or edema. NEUROLOGIC: AOx3, no focal motor deficit. SKIN: no jaundice, no rashes  Assessment/Plan 72 year old female with history of anxiety, arthritis, bipolar disorder, CKD, diabetes, HTN, HLD, hypothyroidism, GERD, chronic constipation, coming for family history of colon cancer. We will proceed with colonoscopy.  Dolores Frame, MD 01/06/2023, 1:16 PM

## 2023-01-06 NOTE — Op Note (Signed)
[facility] Patient Name: Erika Jacobs Procedure Date: 01/06/2023 12:53 PM MRN: [id number] Date of Birth: 06-29-1950 Attending MD: Katrinka Blazing , , [id number] CSN: [id number] Age: 72 Admit Type: Outpatient Procedure:                Colonoscopy Indications:              Screening patient at increased risk: Family history                            of 1st-degree relative with colorectal cancer at                            age 46 years (or older) Providers:                Katrinka Blazing, Nena Polio, RN, Zena Amos Referring MD:              Medicines:                Monitored Anesthesia Care Complications:            No immediate complications. Estimated Blood Loss:     Estimated blood loss: none. Procedure:                Pre-Anesthesia Assessment:                           - Prior to the procedure, a History and Physical                            was performed, and patient medications, allergies                            and sensitivities were reviewed. The patient's                            tolerance of previous anesthesia was reviewed.                           - The risks and benefits of the procedure and the                            sedation options and risks were discussed with the                            patient. All questions were answered and informed                            consent was obtained.                           - ASA Grade Assessment: II - A patient with mild                            systemic disease.                           After obtaining informed  consent, the colonoscope                            was passed under direct vision. Throughout the                            procedure, the patient's blood pressure, pulse, and                            oxygen saturations were monitored continuously. The                            [id number]-HQ190L ([id number]) scope was introduced through                            the anus and advanced to  the the cecum, identified                            by appendiceal orifice and ileocecal valve. The                            colonoscopy was performed without difficulty. The                            patient tolerated the procedure well. The quality                            of the bowel preparation was excellent. Scope In: 1:17:43 PM Scope Out: 1:32:34 PM Scope Withdrawal Time: 0 hours 8 minutes 29 seconds  Total Procedure Duration: 0 hours 14 minutes 51 seconds  Findings:      Hemorrhoids were found on perianal exam.      An 8 mm polyp was found in the ascending colon. The polyp was sessile.       The polyp was removed with a cold snare. Resection and retrieval were       complete.      Scattered small-mouthed diverticula were found in the sigmoid colon.      Non-bleeding internal hemorrhoids were found during retroflexion. The       hemorrhoids were small. Impression:               - Hemorrhoids found on perianal exam.                           - One 8 mm polyp in the ascending colon, removed                            with a cold snare. Resected and retrieved.                           - Diverticulosis in the sigmoid colon.                           - Non-bleeding internal hemorrhoids. Moderate Sedation:      Per Anesthesia Care Recommendation:           -  Discharge patient to home (ambulatory).                           - Resume previous diet.                           - Await pathology results.                           - Repeat colonoscopy in 5 years for surveillance. Procedure Code(s):        --- Professional ---                           [phone], Colonoscopy, flexible; with removal of                            tumor(s), polyp(s), or other lesion(s) by snare                            technique Diagnosis Code(s):        --- Professional ---                           Z80.0, Family history of malignant neoplasm of                            digestive organs                            D12.2, Benign neoplasm of ascending colon                           K64.8, Other hemorrhoids                           K57.30, Diverticulosis of large intestine without                            perforation or abscess without bleeding CPT copyright 2022 [employer]. All rights reserved. The codes documented in this report are preliminary and upon coder review may  be revised to meet current compliance requirements. Katrinka Blazing, MD Katrinka Blazing,  01/06/2023 1:36:45 PM This report has been signed electronically. Number of Addenda: 0

## 2023-01-06 NOTE — Transfer of Care (Signed)
Immediate Anesthesia Transfer of Care Note  Patient: Erika Jacobs  Procedure(s) Performed: COLONOSCOPY WITH PROPOFOL  Patient Location: Short Stay  Anesthesia Type:General  Level of Consciousness: awake, drowsy, and patient cooperative  Airway & Oxygen Therapy: Patient Spontanous Breathing  Post-op Assessment: Report given to RN, Post -op Vital signs reviewed and stable, and Patient moving all extremities X 4  Post vital signs: Reviewed and stable  Last Vitals:  Vitals Value Taken Time  BP 88/53 01/06/23 1339  Temp 36.6 C 01/06/23 1339  Pulse 65 01/06/23 1339  Resp 15 01/06/23 1339  SpO2 99 % 01/06/23 1339    Last Pain:  Vitals:   01/06/23 1339  TempSrc: Oral  PainSc: 0-No pain         Complications: No notable events documented.

## 2023-01-06 NOTE — Discharge Instructions (Signed)
You are being discharged to home.  Resume your previous diet.  We are waiting for your pathology results.  Your physician has recommended a repeat colonoscopy in five years for surveillance.

## 2023-01-07 ENCOUNTER — Encounter (INDEPENDENT_AMBULATORY_CARE_PROVIDER_SITE_OTHER): Payer: Self-pay | Admitting: *Deleted

## 2023-01-07 LAB — SURGICAL PATHOLOGY

## 2023-01-08 ENCOUNTER — Encounter (INDEPENDENT_AMBULATORY_CARE_PROVIDER_SITE_OTHER): Payer: Self-pay | Admitting: *Deleted

## 2023-01-13 DIAGNOSIS — E1142 Type 2 diabetes mellitus with diabetic polyneuropathy: Secondary | ICD-10-CM | POA: Diagnosis not present

## 2023-01-13 DIAGNOSIS — Z23 Encounter for immunization: Secondary | ICD-10-CM | POA: Diagnosis not present

## 2023-01-13 DIAGNOSIS — N1832 Chronic kidney disease, stage 3b: Secondary | ICD-10-CM | POA: Diagnosis not present

## 2023-01-13 DIAGNOSIS — Z299 Encounter for prophylactic measures, unspecified: Secondary | ICD-10-CM | POA: Diagnosis not present

## 2023-01-13 DIAGNOSIS — E1122 Type 2 diabetes mellitus with diabetic chronic kidney disease: Secondary | ICD-10-CM | POA: Diagnosis not present

## 2023-01-13 DIAGNOSIS — E1165 Type 2 diabetes mellitus with hyperglycemia: Secondary | ICD-10-CM | POA: Diagnosis not present

## 2023-01-13 DIAGNOSIS — I10 Essential (primary) hypertension: Secondary | ICD-10-CM | POA: Diagnosis not present

## 2023-01-13 NOTE — Anesthesia Preprocedure Evaluation (Signed)
Anesthesia Evaluation  Patient identified by MRN, date of birth, ID band Patient awake    Reviewed: Allergy & Precautions, H&P , NPO status , Patient's Chart, lab work & pertinent test results, reviewed documented beta blocker date and time   Airway Mallampati: II  TM Distance: >3 FB Neck ROM: full    Dental no notable dental hx.    Pulmonary neg pulmonary ROS, shortness of breath, former smoker   Pulmonary exam normal breath sounds clear to auscultation       Cardiovascular Exercise Tolerance: Good hypertension, negative cardio ROS  Rhythm:regular Rate:Normal     Neuro/Psych  Headaches PSYCHIATRIC DISORDERS Anxiety Depression Bipolar Disorder Schizophrenia   Neuromuscular disease negative neurological ROS  negative psych ROS   GI/Hepatic negative GI ROS, Neg liver ROS,GERD  ,,  Endo/Other  negative endocrine ROSdiabetesHypothyroidism    Renal/GU Renal diseasenegative Renal ROS  negative genitourinary   Musculoskeletal   Abdominal   Peds  Hematology negative hematology ROS (+) Blood dyscrasia, anemia   Anesthesia Other Findings   Reproductive/Obstetrics negative OB ROS                             Anesthesia Physical Anesthesia Plan  ASA: 3  Anesthesia Plan: General   Post-op Pain Management:    Induction:   PONV Risk Score and Plan: Propofol infusion  Airway Management Planned:   Additional Equipment:   Intra-op Plan:   Post-operative Plan:   Informed Consent: I have reviewed the patients History and Physical, chart, labs and discussed the procedure including the risks, benefits and alternatives for the proposed anesthesia with the patient or authorized representative who has indicated his/her understanding and acceptance.     Dental Advisory Given  Plan Discussed with: CRNA  Anesthesia Plan Comments:        Anesthesia Quick Evaluation

## 2023-01-13 NOTE — Anesthesia Postprocedure Evaluation (Signed)
Anesthesia Post Note  Patient: Erika Jacobs  Procedure(s) Performed: COLONOSCOPY WITH PROPOFOL  Patient location during evaluation: Phase II Anesthesia Type: General Level of consciousness: awake Pain management: pain level controlled Vital Signs Assessment: post-procedure vital signs reviewed and stable Respiratory status: spontaneous breathing and respiratory function stable Cardiovascular status: blood pressure returned to baseline and stable Postop Assessment: no headache and no apparent nausea or vomiting Anesthetic complications: no Comments: Late entry   No notable events documented.   Last Vitals:  Vitals:   01/06/23 1339 01/06/23 1344  BP: (!) 88/53 96/62  Pulse: 65   Resp: 15   Temp: 36.6 C   SpO2: 99%     Last Pain:  Vitals:   01/07/23 1430  TempSrc:   PainSc: 0-No pain                 Windell Norfolk

## 2023-01-15 ENCOUNTER — Encounter (HOSPITAL_COMMUNITY): Payer: Self-pay | Admitting: Gastroenterology

## 2023-02-06 DIAGNOSIS — Z299 Encounter for prophylactic measures, unspecified: Secondary | ICD-10-CM | POA: Diagnosis not present

## 2023-02-06 DIAGNOSIS — L821 Other seborrheic keratosis: Secondary | ICD-10-CM | POA: Diagnosis not present

## 2023-02-06 DIAGNOSIS — I10 Essential (primary) hypertension: Secondary | ICD-10-CM | POA: Diagnosis not present

## 2023-02-06 DIAGNOSIS — N1832 Chronic kidney disease, stage 3b: Secondary | ICD-10-CM | POA: Diagnosis not present

## 2023-02-06 DIAGNOSIS — E1169 Type 2 diabetes mellitus with other specified complication: Secondary | ICD-10-CM | POA: Diagnosis not present

## 2023-02-13 DIAGNOSIS — F29 Unspecified psychosis not due to a substance or known physiological condition: Secondary | ICD-10-CM | POA: Diagnosis not present

## 2023-02-20 ENCOUNTER — Ambulatory Visit
Admission: RE | Admit: 2023-02-20 | Discharge: 2023-02-20 | Disposition: A | Discharge status: Home / Self Care | Payer: PPO | Source: Ambulatory Visit | Attending: Internal Medicine | Admitting: Internal Medicine

## 2023-02-20 ENCOUNTER — Other Ambulatory Visit: Payer: Self-pay | Admitting: Internal Medicine

## 2023-02-20 DIAGNOSIS — Z1231 Encounter for screening mammogram for malignant neoplasm of breast: Secondary | ICD-10-CM

## 2023-03-02 DIAGNOSIS — Z299 Encounter for prophylactic measures, unspecified: Secondary | ICD-10-CM | POA: Diagnosis not present

## 2023-03-02 DIAGNOSIS — Z0000 Encounter for general adult medical examination without abnormal findings: Secondary | ICD-10-CM | POA: Diagnosis not present

## 2023-03-02 DIAGNOSIS — Z7189 Other specified counseling: Secondary | ICD-10-CM | POA: Diagnosis not present

## 2023-03-02 DIAGNOSIS — N1832 Chronic kidney disease, stage 3b: Secondary | ICD-10-CM | POA: Diagnosis not present

## 2023-03-02 DIAGNOSIS — Z1331 Encounter for screening for depression: Secondary | ICD-10-CM | POA: Diagnosis not present

## 2023-03-02 DIAGNOSIS — Z1339 Encounter for screening examination for other mental health and behavioral disorders: Secondary | ICD-10-CM | POA: Diagnosis not present

## 2023-03-02 DIAGNOSIS — E1169 Type 2 diabetes mellitus with other specified complication: Secondary | ICD-10-CM | POA: Diagnosis not present

## 2023-03-02 DIAGNOSIS — I10 Essential (primary) hypertension: Secondary | ICD-10-CM | POA: Diagnosis not present

## 2023-03-30 DIAGNOSIS — N1832 Chronic kidney disease, stage 3b: Secondary | ICD-10-CM | POA: Diagnosis not present

## 2023-04-04 DIAGNOSIS — E1122 Type 2 diabetes mellitus with diabetic chronic kidney disease: Secondary | ICD-10-CM | POA: Diagnosis not present

## 2023-04-04 DIAGNOSIS — N1832 Chronic kidney disease, stage 3b: Secondary | ICD-10-CM | POA: Diagnosis not present

## 2023-04-04 DIAGNOSIS — I129 Hypertensive chronic kidney disease with stage 1 through stage 4 chronic kidney disease, or unspecified chronic kidney disease: Secondary | ICD-10-CM | POA: Diagnosis not present

## 2023-04-04 DIAGNOSIS — D638 Anemia in other chronic diseases classified elsewhere: Secondary | ICD-10-CM | POA: Diagnosis not present

## 2023-04-29 DIAGNOSIS — Z6841 Body Mass Index (BMI) 40.0 and over, adult: Secondary | ICD-10-CM | POA: Diagnosis not present

## 2023-04-29 DIAGNOSIS — N1832 Chronic kidney disease, stage 3b: Secondary | ICD-10-CM | POA: Diagnosis not present

## 2023-04-29 DIAGNOSIS — E1169 Type 2 diabetes mellitus with other specified complication: Secondary | ICD-10-CM | POA: Diagnosis not present

## 2023-04-29 DIAGNOSIS — I10 Essential (primary) hypertension: Secondary | ICD-10-CM | POA: Diagnosis not present

## 2023-04-29 DIAGNOSIS — Z299 Encounter for prophylactic measures, unspecified: Secondary | ICD-10-CM | POA: Diagnosis not present

## 2023-05-08 DIAGNOSIS — Z794 Long term (current) use of insulin: Secondary | ICD-10-CM | POA: Diagnosis not present

## 2023-05-08 DIAGNOSIS — I10 Essential (primary) hypertension: Secondary | ICD-10-CM | POA: Diagnosis not present

## 2023-05-08 DIAGNOSIS — E1165 Type 2 diabetes mellitus with hyperglycemia: Secondary | ICD-10-CM | POA: Diagnosis not present

## 2023-05-08 DIAGNOSIS — I129 Hypertensive chronic kidney disease with stage 1 through stage 4 chronic kidney disease, or unspecified chronic kidney disease: Secondary | ICD-10-CM | POA: Diagnosis not present

## 2023-05-08 DIAGNOSIS — E1122 Type 2 diabetes mellitus with diabetic chronic kidney disease: Secondary | ICD-10-CM | POA: Diagnosis not present

## 2023-05-08 DIAGNOSIS — N179 Acute kidney failure, unspecified: Secondary | ICD-10-CM | POA: Diagnosis not present

## 2023-05-08 DIAGNOSIS — N183 Chronic kidney disease, stage 3 unspecified: Secondary | ICD-10-CM | POA: Diagnosis not present

## 2023-05-08 DIAGNOSIS — R457 State of emotional shock and stress, unspecified: Secondary | ICD-10-CM | POA: Diagnosis not present

## 2023-05-20 DIAGNOSIS — I10 Essential (primary) hypertension: Secondary | ICD-10-CM | POA: Diagnosis not present

## 2023-05-20 DIAGNOSIS — E1169 Type 2 diabetes mellitus with other specified complication: Secondary | ICD-10-CM | POA: Diagnosis not present

## 2023-05-20 DIAGNOSIS — Z299 Encounter for prophylactic measures, unspecified: Secondary | ICD-10-CM | POA: Diagnosis not present

## 2023-05-20 DIAGNOSIS — F319 Bipolar disorder, unspecified: Secondary | ICD-10-CM | POA: Diagnosis not present

## 2023-05-20 DIAGNOSIS — N1832 Chronic kidney disease, stage 3b: Secondary | ICD-10-CM | POA: Diagnosis not present

## 2023-06-17 DIAGNOSIS — E1169 Type 2 diabetes mellitus with other specified complication: Secondary | ICD-10-CM | POA: Diagnosis not present

## 2023-06-17 DIAGNOSIS — Z299 Encounter for prophylactic measures, unspecified: Secondary | ICD-10-CM | POA: Diagnosis not present

## 2023-06-17 DIAGNOSIS — I10 Essential (primary) hypertension: Secondary | ICD-10-CM | POA: Diagnosis not present

## 2023-06-17 DIAGNOSIS — N1832 Chronic kidney disease, stage 3b: Secondary | ICD-10-CM | POA: Diagnosis not present

## 2023-07-24 DIAGNOSIS — F29 Unspecified psychosis not due to a substance or known physiological condition: Secondary | ICD-10-CM | POA: Diagnosis not present

## 2023-08-03 DIAGNOSIS — Z299 Encounter for prophylactic measures, unspecified: Secondary | ICD-10-CM | POA: Diagnosis not present

## 2023-08-03 DIAGNOSIS — Z6841 Body Mass Index (BMI) 40.0 and over, adult: Secondary | ICD-10-CM | POA: Diagnosis not present

## 2023-08-03 DIAGNOSIS — I10 Essential (primary) hypertension: Secondary | ICD-10-CM | POA: Diagnosis not present

## 2023-08-03 DIAGNOSIS — Z0000 Encounter for general adult medical examination without abnormal findings: Secondary | ICD-10-CM | POA: Diagnosis not present

## 2023-08-03 DIAGNOSIS — E1169 Type 2 diabetes mellitus with other specified complication: Secondary | ICD-10-CM | POA: Diagnosis not present

## 2023-08-31 DIAGNOSIS — E1169 Type 2 diabetes mellitus with other specified complication: Secondary | ICD-10-CM | POA: Diagnosis not present

## 2023-08-31 DIAGNOSIS — I10 Essential (primary) hypertension: Secondary | ICD-10-CM | POA: Diagnosis not present

## 2023-08-31 DIAGNOSIS — Z299 Encounter for prophylactic measures, unspecified: Secondary | ICD-10-CM | POA: Diagnosis not present

## 2023-09-16 DIAGNOSIS — E1142 Type 2 diabetes mellitus with diabetic polyneuropathy: Secondary | ICD-10-CM | POA: Diagnosis not present

## 2023-09-19 DIAGNOSIS — R809 Proteinuria, unspecified: Secondary | ICD-10-CM | POA: Diagnosis not present

## 2023-09-19 DIAGNOSIS — E8722 Chronic metabolic acidosis: Secondary | ICD-10-CM | POA: Diagnosis not present

## 2023-09-19 DIAGNOSIS — E1129 Type 2 diabetes mellitus with other diabetic kidney complication: Secondary | ICD-10-CM | POA: Diagnosis not present

## 2023-09-19 DIAGNOSIS — N1831 Chronic kidney disease, stage 3a: Secondary | ICD-10-CM | POA: Diagnosis not present

## 2023-10-06 DIAGNOSIS — E113293 Type 2 diabetes mellitus with mild nonproliferative diabetic retinopathy without macular edema, bilateral: Secondary | ICD-10-CM | POA: Diagnosis not present

## 2023-12-02 NOTE — Progress Notes (Signed)
   12/02/2023  Patient ID: Erika Jacobs, female   DOB: 1950/08/26, 73 y.o.   MRN: [id number]  Reviewed patient regarding medication adherence from a quality report for [facility].     Per [employer] and payer portal fill history: 1. Atorvastatin 20 mg - last filled 09/22/23 for a 30-day supply. 2. Losartan  25 mg - last filled 09/22/23 for a 30-day supply.  I will follow up for adherence monitoring.  Thank you for allowing pharmacy to be a part of this patient's care.    Heather Factor, PharmD Clinical Pharmacist  [phone]

## 2023-12-07 DIAGNOSIS — I10 Essential (primary) hypertension: Secondary | ICD-10-CM | POA: Diagnosis not present

## 2023-12-07 DIAGNOSIS — Z6841 Body Mass Index (BMI) 40.0 and over, adult: Secondary | ICD-10-CM | POA: Diagnosis not present

## 2023-12-07 DIAGNOSIS — F319 Bipolar disorder, unspecified: Secondary | ICD-10-CM | POA: Diagnosis not present

## 2023-12-07 DIAGNOSIS — N1832 Chronic kidney disease, stage 3b: Secondary | ICD-10-CM | POA: Diagnosis not present

## 2023-12-07 DIAGNOSIS — Z299 Encounter for prophylactic measures, unspecified: Secondary | ICD-10-CM | POA: Diagnosis not present

## 2023-12-07 DIAGNOSIS — E119 Type 2 diabetes mellitus without complications: Secondary | ICD-10-CM | POA: Diagnosis not present

## 2024-01-01 DIAGNOSIS — F29 Unspecified psychosis not due to a substance or known physiological condition: Secondary | ICD-10-CM | POA: Diagnosis not present

## 2024-01-11 DIAGNOSIS — I10 Essential (primary) hypertension: Secondary | ICD-10-CM | POA: Diagnosis not present

## 2024-01-11 DIAGNOSIS — E1142 Type 2 diabetes mellitus with diabetic polyneuropathy: Secondary | ICD-10-CM | POA: Diagnosis not present

## 2024-01-11 DIAGNOSIS — Z0000 Encounter for general adult medical examination without abnormal findings: Secondary | ICD-10-CM | POA: Diagnosis not present

## 2024-01-11 DIAGNOSIS — Z299 Encounter for prophylactic measures, unspecified: Secondary | ICD-10-CM | POA: Diagnosis not present

## 2024-01-11 DIAGNOSIS — N1832 Chronic kidney disease, stage 3b: Secondary | ICD-10-CM | POA: Diagnosis not present

## 2024-01-11 DIAGNOSIS — Z1339 Encounter for screening examination for other mental health and behavioral disorders: Secondary | ICD-10-CM | POA: Diagnosis not present

## 2024-01-11 DIAGNOSIS — Z1331 Encounter for screening for depression: Secondary | ICD-10-CM | POA: Diagnosis not present

## 2024-01-11 DIAGNOSIS — E1122 Type 2 diabetes mellitus with diabetic chronic kidney disease: Secondary | ICD-10-CM | POA: Diagnosis not present

## 2024-01-11 DIAGNOSIS — Z7189 Other specified counseling: Secondary | ICD-10-CM | POA: Diagnosis not present

## 2024-01-15 DIAGNOSIS — Z23 Encounter for immunization: Secondary | ICD-10-CM | POA: Diagnosis not present

## 2024-01-15 DIAGNOSIS — F319 Bipolar disorder, unspecified: Secondary | ICD-10-CM | POA: Diagnosis not present

## 2024-01-15 DIAGNOSIS — Z299 Encounter for prophylactic measures, unspecified: Secondary | ICD-10-CM | POA: Diagnosis not present

## 2024-01-15 DIAGNOSIS — Z6841 Body Mass Index (BMI) 40.0 and over, adult: Secondary | ICD-10-CM | POA: Diagnosis not present

## 2024-01-15 DIAGNOSIS — Z0000 Encounter for general adult medical examination without abnormal findings: Secondary | ICD-10-CM | POA: Diagnosis not present

## 2024-01-15 DIAGNOSIS — I10 Essential (primary) hypertension: Secondary | ICD-10-CM | POA: Diagnosis not present

## 2024-01-15 DIAGNOSIS — E1169 Type 2 diabetes mellitus with other specified complication: Secondary | ICD-10-CM | POA: Diagnosis not present

## 2024-01-15 DIAGNOSIS — E1122 Type 2 diabetes mellitus with diabetic chronic kidney disease: Secondary | ICD-10-CM | POA: Diagnosis not present

## 2024-01-15 DIAGNOSIS — E119 Type 2 diabetes mellitus without complications: Secondary | ICD-10-CM | POA: Diagnosis not present

## 2024-01-18 ENCOUNTER — Telehealth: Payer: Self-pay

## 2024-01-18 NOTE — Progress Notes (Signed)
   01/18/2024  Patient ID: Erika Jacobs, female   DOB: 03-06-1951, 73 y.o.   MRN: [id number]  Contacted patient regarding medication adherence from a quality report for [facility].      Per [employer] and payer portal fill history:  1. Atorvastatin 20 mg - last filled 12/20/23 for a 30-day supply. 2. Losartan  25 mg - last filled 12/20/23 for a 30-day supply.  I confirmed with the pharmacy that the patient is enrolled in automatic refills, patient should pass adherence measures.   Heather Factor, PharmD Clinical Pharmacist  [phone]

## 2024-01-20 DIAGNOSIS — E8722 Chronic metabolic acidosis: Secondary | ICD-10-CM | POA: Diagnosis not present

## 2024-01-20 DIAGNOSIS — N1832 Chronic kidney disease, stage 3b: Secondary | ICD-10-CM | POA: Diagnosis not present

## 2024-01-20 DIAGNOSIS — E1129 Type 2 diabetes mellitus with other diabetic kidney complication: Secondary | ICD-10-CM | POA: Diagnosis not present

## 2024-01-20 DIAGNOSIS — N2581 Secondary hyperparathyroidism of renal origin: Secondary | ICD-10-CM | POA: Diagnosis not present

## 2024-02-08 DIAGNOSIS — Z299 Encounter for prophylactic measures, unspecified: Secondary | ICD-10-CM | POA: Diagnosis not present

## 2024-02-08 DIAGNOSIS — Z6841 Body Mass Index (BMI) 40.0 and over, adult: Secondary | ICD-10-CM | POA: Diagnosis not present

## 2024-02-08 DIAGNOSIS — E1169 Type 2 diabetes mellitus with other specified complication: Secondary | ICD-10-CM | POA: Diagnosis not present

## 2024-02-08 DIAGNOSIS — I10 Essential (primary) hypertension: Secondary | ICD-10-CM | POA: Diagnosis not present

## 2024-02-08 DIAGNOSIS — R52 Pain, unspecified: Secondary | ICD-10-CM | POA: Diagnosis not present

## 2024-02-24 ENCOUNTER — Encounter (INDEPENDENT_AMBULATORY_CARE_PROVIDER_SITE_OTHER): Payer: Self-pay | Admitting: Gastroenterology

## 2024-03-11 DIAGNOSIS — E119 Type 2 diabetes mellitus without complications: Secondary | ICD-10-CM | POA: Diagnosis not present

## 2024-03-11 DIAGNOSIS — I10 Essential (primary) hypertension: Secondary | ICD-10-CM | POA: Diagnosis not present

## 2024-03-11 DIAGNOSIS — Z299 Encounter for prophylactic measures, unspecified: Secondary | ICD-10-CM | POA: Diagnosis not present

## 2024-03-11 DIAGNOSIS — N1832 Chronic kidney disease, stage 3b: Secondary | ICD-10-CM | POA: Diagnosis not present

## 2024-03-11 DIAGNOSIS — F319 Bipolar disorder, unspecified: Secondary | ICD-10-CM | POA: Diagnosis not present

## 2024-03-11 DIAGNOSIS — Z0000 Encounter for general adult medical examination without abnormal findings: Secondary | ICD-10-CM | POA: Diagnosis not present

## 2024-03-11 DIAGNOSIS — D649 Anemia, unspecified: Secondary | ICD-10-CM | POA: Diagnosis not present

## 2024-03-18 DIAGNOSIS — E1122 Type 2 diabetes mellitus with diabetic chronic kidney disease: Secondary | ICD-10-CM | POA: Diagnosis not present

## 2024-03-18 DIAGNOSIS — N2581 Secondary hyperparathyroidism of renal origin: Secondary | ICD-10-CM | POA: Diagnosis not present

## 2024-03-18 DIAGNOSIS — I129 Hypertensive chronic kidney disease with stage 1 through stage 4 chronic kidney disease, or unspecified chronic kidney disease: Secondary | ICD-10-CM | POA: Diagnosis not present

## 2024-03-18 DIAGNOSIS — R809 Proteinuria, unspecified: Secondary | ICD-10-CM | POA: Diagnosis not present
